# Patient Record
Sex: Female | Born: 1955
Health system: Southern US, Community
[De-identification: ages and names within clinical notes are randomized; demographics above are authoritative.]

## PROBLEM LIST (undated history)

## (undated) DIAGNOSIS — J189 Pneumonia, unspecified organism: Secondary | ICD-10-CM

## (undated) DIAGNOSIS — K219 Gastro-esophageal reflux disease without esophagitis: Secondary | ICD-10-CM

## (undated) DIAGNOSIS — J8410 Pulmonary fibrosis, unspecified: Secondary | ICD-10-CM

## (undated) HISTORY — PX: ANKLE SURGERY: SHX546

## (undated) HISTORY — PX: COLONOSCOPY: SHX174

## (undated) HISTORY — PX: LUNG BIOPSY: SHX232

## (undated) HISTORY — DX: Gastro-esophageal reflux disease without esophagitis: K21.9

## (undated) HISTORY — PX: HEMORRHOID SURGERY: SHX153

## (undated) HISTORY — DX: Pneumonia, unspecified organism: J18.9

## (undated) HISTORY — DX: Pulmonary fibrosis, unspecified: J84.10

## (undated) HISTORY — PX: TUBAL LIGATION: SHX77

## (undated) HISTORY — PX: ESOPHAGOGASTRODUODENOSCOPY: SHX1529

---

## 2008-06-27 ENCOUNTER — Encounter: Payer: Self-pay | Admitting: Family Medicine

## 2008-06-27 LAB — HM COLONOSCOPY

## 2016-07-29 DIAGNOSIS — K219 Gastro-esophageal reflux disease without esophagitis: Secondary | ICD-10-CM | POA: Diagnosis not present

## 2016-07-29 DIAGNOSIS — E042 Nontoxic multinodular goiter: Secondary | ICD-10-CM | POA: Diagnosis not present

## 2016-07-29 DIAGNOSIS — F458 Other somatoform disorders: Secondary | ICD-10-CM | POA: Diagnosis not present

## 2016-09-25 DIAGNOSIS — Z7952 Long term (current) use of systemic steroids: Secondary | ICD-10-CM | POA: Diagnosis not present

## 2016-09-25 DIAGNOSIS — J189 Pneumonia, unspecified organism: Secondary | ICD-10-CM | POA: Diagnosis not present

## 2016-09-25 DIAGNOSIS — I517 Cardiomegaly: Secondary | ICD-10-CM | POA: Diagnosis not present

## 2016-09-25 DIAGNOSIS — J181 Lobar pneumonia, unspecified organism: Secondary | ICD-10-CM | POA: Diagnosis not present

## 2016-09-25 DIAGNOSIS — J84112 Idiopathic pulmonary fibrosis: Secondary | ICD-10-CM | POA: Diagnosis not present

## 2016-09-25 DIAGNOSIS — R0602 Shortness of breath: Secondary | ICD-10-CM | POA: Diagnosis not present

## 2016-09-25 DIAGNOSIS — R0600 Dyspnea, unspecified: Secondary | ICD-10-CM | POA: Diagnosis not present

## 2016-09-25 DIAGNOSIS — Z87891 Personal history of nicotine dependence: Secondary | ICD-10-CM | POA: Diagnosis not present

## 2016-09-25 DIAGNOSIS — E7800 Pure hypercholesterolemia, unspecified: Secondary | ICD-10-CM | POA: Diagnosis not present

## 2016-09-25 DIAGNOSIS — J8410 Pulmonary fibrosis, unspecified: Secondary | ICD-10-CM | POA: Diagnosis not present

## 2016-09-25 DIAGNOSIS — J168 Pneumonia due to other specified infectious organisms: Secondary | ICD-10-CM | POA: Diagnosis not present

## 2016-09-25 DIAGNOSIS — R509 Fever, unspecified: Secondary | ICD-10-CM | POA: Diagnosis not present

## 2016-09-25 DIAGNOSIS — J9611 Chronic respiratory failure with hypoxia: Secondary | ICD-10-CM | POA: Diagnosis not present

## 2016-10-03 DIAGNOSIS — R05 Cough: Secondary | ICD-10-CM | POA: Diagnosis not present

## 2016-10-03 DIAGNOSIS — K219 Gastro-esophageal reflux disease without esophagitis: Secondary | ICD-10-CM | POA: Diagnosis not present

## 2016-10-21 DIAGNOSIS — J84112 Idiopathic pulmonary fibrosis: Secondary | ICD-10-CM | POA: Diagnosis not present

## 2016-10-21 DIAGNOSIS — J984 Other disorders of lung: Secondary | ICD-10-CM | POA: Diagnosis not present

## 2016-10-21 DIAGNOSIS — R0902 Hypoxemia: Secondary | ICD-10-CM | POA: Diagnosis not present

## 2016-10-21 DIAGNOSIS — J849 Interstitial pulmonary disease, unspecified: Secondary | ICD-10-CM | POA: Diagnosis not present

## 2016-10-21 DIAGNOSIS — Z9981 Dependence on supplemental oxygen: Secondary | ICD-10-CM | POA: Diagnosis not present

## 2017-01-07 DIAGNOSIS — H2513 Age-related nuclear cataract, bilateral: Secondary | ICD-10-CM | POA: Diagnosis not present

## 2017-01-07 DIAGNOSIS — H04123 Dry eye syndrome of bilateral lacrimal glands: Secondary | ICD-10-CM | POA: Diagnosis not present

## 2017-02-21 DIAGNOSIS — J9611 Chronic respiratory failure with hypoxia: Secondary | ICD-10-CM | POA: Diagnosis not present

## 2017-02-21 DIAGNOSIS — J84112 Idiopathic pulmonary fibrosis: Secondary | ICD-10-CM | POA: Diagnosis not present

## 2017-02-21 DIAGNOSIS — N281 Cyst of kidney, acquired: Secondary | ICD-10-CM | POA: Diagnosis not present

## 2017-02-21 DIAGNOSIS — J45909 Unspecified asthma, uncomplicated: Secondary | ICD-10-CM | POA: Diagnosis not present

## 2017-02-21 DIAGNOSIS — I517 Cardiomegaly: Secondary | ICD-10-CM | POA: Diagnosis not present

## 2017-02-21 DIAGNOSIS — J849 Interstitial pulmonary disease, unspecified: Secondary | ICD-10-CM | POA: Diagnosis not present

## 2017-04-09 DIAGNOSIS — H2513 Age-related nuclear cataract, bilateral: Secondary | ICD-10-CM | POA: Diagnosis not present

## 2017-04-09 DIAGNOSIS — H04123 Dry eye syndrome of bilateral lacrimal glands: Secondary | ICD-10-CM | POA: Diagnosis not present

## 2017-04-11 DIAGNOSIS — Z23 Encounter for immunization: Secondary | ICD-10-CM | POA: Diagnosis not present

## 2017-04-30 DIAGNOSIS — H25813 Combined forms of age-related cataract, bilateral: Secondary | ICD-10-CM | POA: Diagnosis not present

## 2017-05-05 DIAGNOSIS — J439 Emphysema, unspecified: Secondary | ICD-10-CM | POA: Diagnosis not present

## 2017-05-05 DIAGNOSIS — Z9981 Dependence on supplemental oxygen: Secondary | ICD-10-CM | POA: Diagnosis not present

## 2017-05-05 DIAGNOSIS — G4733 Obstructive sleep apnea (adult) (pediatric): Secondary | ICD-10-CM | POA: Diagnosis not present

## 2017-05-05 DIAGNOSIS — I10 Essential (primary) hypertension: Secondary | ICD-10-CM | POA: Diagnosis not present

## 2017-05-05 DIAGNOSIS — R0602 Shortness of breath: Secondary | ICD-10-CM | POA: Diagnosis not present

## 2017-05-05 DIAGNOSIS — J849 Interstitial pulmonary disease, unspecified: Secondary | ICD-10-CM | POA: Diagnosis not present

## 2017-05-05 DIAGNOSIS — Z9989 Dependence on other enabling machines and devices: Secondary | ICD-10-CM | POA: Diagnosis not present

## 2017-05-05 DIAGNOSIS — Z87891 Personal history of nicotine dependence: Secondary | ICD-10-CM | POA: Diagnosis not present

## 2017-05-05 DIAGNOSIS — Z79899 Other long term (current) drug therapy: Secondary | ICD-10-CM | POA: Diagnosis not present

## 2017-05-13 DIAGNOSIS — Z79899 Other long term (current) drug therapy: Secondary | ICD-10-CM | POA: Diagnosis not present

## 2017-05-13 DIAGNOSIS — Z7952 Long term (current) use of systemic steroids: Secondary | ICD-10-CM | POA: Diagnosis not present

## 2017-05-13 DIAGNOSIS — H2512 Age-related nuclear cataract, left eye: Secondary | ICD-10-CM | POA: Diagnosis not present

## 2017-05-13 DIAGNOSIS — H25812 Combined forms of age-related cataract, left eye: Secondary | ICD-10-CM | POA: Diagnosis not present

## 2017-06-05 DIAGNOSIS — H2511 Age-related nuclear cataract, right eye: Secondary | ICD-10-CM | POA: Diagnosis not present

## 2017-06-05 DIAGNOSIS — H25811 Combined forms of age-related cataract, right eye: Secondary | ICD-10-CM | POA: Diagnosis not present

## 2017-06-24 DIAGNOSIS — J9611 Chronic respiratory failure with hypoxia: Secondary | ICD-10-CM | POA: Diagnosis not present

## 2017-07-11 DIAGNOSIS — Z124 Encounter for screening for malignant neoplasm of cervix: Secondary | ICD-10-CM | POA: Diagnosis not present

## 2017-07-11 DIAGNOSIS — R0602 Shortness of breath: Secondary | ICD-10-CM | POA: Diagnosis not present

## 2017-07-11 DIAGNOSIS — Z1231 Encounter for screening mammogram for malignant neoplasm of breast: Secondary | ICD-10-CM | POA: Diagnosis not present

## 2017-07-11 DIAGNOSIS — M8580 Other specified disorders of bone density and structure, unspecified site: Secondary | ICD-10-CM | POA: Diagnosis not present

## 2017-08-06 DIAGNOSIS — M810 Age-related osteoporosis without current pathological fracture: Secondary | ICD-10-CM | POA: Diagnosis not present

## 2017-08-06 DIAGNOSIS — K219 Gastro-esophageal reflux disease without esophagitis: Secondary | ICD-10-CM | POA: Diagnosis not present

## 2017-08-06 DIAGNOSIS — R252 Cramp and spasm: Secondary | ICD-10-CM | POA: Diagnosis not present

## 2017-08-06 DIAGNOSIS — M159 Polyosteoarthritis, unspecified: Secondary | ICD-10-CM | POA: Diagnosis not present

## 2017-08-06 DIAGNOSIS — E782 Mixed hyperlipidemia: Secondary | ICD-10-CM | POA: Diagnosis not present

## 2017-08-11 DIAGNOSIS — E782 Mixed hyperlipidemia: Secondary | ICD-10-CM | POA: Diagnosis not present

## 2017-08-11 DIAGNOSIS — M159 Polyosteoarthritis, unspecified: Secondary | ICD-10-CM | POA: Diagnosis not present

## 2017-08-11 DIAGNOSIS — M810 Age-related osteoporosis without current pathological fracture: Secondary | ICD-10-CM | POA: Diagnosis not present

## 2017-08-11 DIAGNOSIS — R252 Cramp and spasm: Secondary | ICD-10-CM | POA: Diagnosis not present

## 2017-08-11 DIAGNOSIS — K219 Gastro-esophageal reflux disease without esophagitis: Secondary | ICD-10-CM | POA: Diagnosis not present

## 2017-08-25 DIAGNOSIS — Z9981 Dependence on supplemental oxygen: Secondary | ICD-10-CM | POA: Diagnosis not present

## 2017-08-25 DIAGNOSIS — G4733 Obstructive sleep apnea (adult) (pediatric): Secondary | ICD-10-CM | POA: Diagnosis not present

## 2017-08-25 DIAGNOSIS — Z87891 Personal history of nicotine dependence: Secondary | ICD-10-CM | POA: Diagnosis not present

## 2017-08-25 DIAGNOSIS — I10 Essential (primary) hypertension: Secondary | ICD-10-CM | POA: Diagnosis not present

## 2017-08-25 DIAGNOSIS — Z79899 Other long term (current) drug therapy: Secondary | ICD-10-CM | POA: Diagnosis not present

## 2017-08-25 DIAGNOSIS — R0602 Shortness of breath: Secondary | ICD-10-CM | POA: Diagnosis not present

## 2017-08-25 DIAGNOSIS — J849 Interstitial pulmonary disease, unspecified: Secondary | ICD-10-CM | POA: Diagnosis not present

## 2017-08-25 DIAGNOSIS — Z9989 Dependence on other enabling machines and devices: Secondary | ICD-10-CM | POA: Diagnosis not present

## 2017-08-25 DIAGNOSIS — J8410 Pulmonary fibrosis, unspecified: Secondary | ICD-10-CM | POA: Diagnosis not present

## 2017-09-01 DIAGNOSIS — Z780 Asymptomatic menopausal state: Secondary | ICD-10-CM | POA: Diagnosis not present

## 2017-09-01 DIAGNOSIS — M8588 Other specified disorders of bone density and structure, other site: Secondary | ICD-10-CM | POA: Diagnosis not present

## 2017-09-01 DIAGNOSIS — M810 Age-related osteoporosis without current pathological fracture: Secondary | ICD-10-CM | POA: Diagnosis not present

## 2017-10-23 DIAGNOSIS — J9611 Chronic respiratory failure with hypoxia: Secondary | ICD-10-CM | POA: Diagnosis not present

## 2017-11-10 DIAGNOSIS — R0602 Shortness of breath: Secondary | ICD-10-CM | POA: Diagnosis not present

## 2017-11-19 DIAGNOSIS — H26491 Other secondary cataract, right eye: Secondary | ICD-10-CM | POA: Diagnosis not present

## 2017-11-19 DIAGNOSIS — H26493 Other secondary cataract, bilateral: Secondary | ICD-10-CM | POA: Diagnosis not present

## 2017-11-19 DIAGNOSIS — H04123 Dry eye syndrome of bilateral lacrimal glands: Secondary | ICD-10-CM | POA: Diagnosis not present

## 2017-11-26 DIAGNOSIS — H26492 Other secondary cataract, left eye: Secondary | ICD-10-CM | POA: Diagnosis not present

## 2018-03-24 DIAGNOSIS — Z23 Encounter for immunization: Secondary | ICD-10-CM | POA: Diagnosis not present

## 2018-04-06 ENCOUNTER — Ambulatory Visit (INDEPENDENT_AMBULATORY_CARE_PROVIDER_SITE_OTHER): Payer: Medicare Other | Admitting: Family

## 2018-04-06 ENCOUNTER — Encounter: Payer: Self-pay | Admitting: Family

## 2018-04-06 VITALS — BP 126/78 | HR 77 | Temp 98.1°F | Ht 66.0 in | Wt 157.1 lb

## 2018-04-06 DIAGNOSIS — M810 Age-related osteoporosis without current pathological fracture: Secondary | ICD-10-CM

## 2018-04-06 DIAGNOSIS — J8410 Pulmonary fibrosis, unspecified: Secondary | ICD-10-CM | POA: Diagnosis not present

## 2018-04-06 DIAGNOSIS — E785 Hyperlipidemia, unspecified: Secondary | ICD-10-CM

## 2018-04-06 DIAGNOSIS — K219 Gastro-esophageal reflux disease without esophagitis: Secondary | ICD-10-CM

## 2018-04-06 DIAGNOSIS — J849 Interstitial pulmonary disease, unspecified: Secondary | ICD-10-CM | POA: Diagnosis not present

## 2018-04-06 DIAGNOSIS — F4321 Adjustment disorder with depressed mood: Secondary | ICD-10-CM

## 2018-04-06 MED ORDER — ESCITALOPRAM OXALATE 10 MG PO TABS: 10.0000 mg | ORAL_TABLET | Freq: Every day | ORAL | 1 refills | Status: DC

## 2018-04-06 NOTE — Patient Instructions (Signed)
Please check with your previous PCP about date/ location of colonoscopy; bone density;

## 2018-04-06 NOTE — Progress Notes (Signed)
Stacy [name] is a 62 y.o. female with the following history as recorded in EpicCare:  Patient Active Problem List   Diagnosis Date Noted  . Pulmonary fibrosis ([place]) 04/06/2018  . ILD (interstitial lung disease) ([place]) 04/06/2018  . Gastroesophageal reflux disease 04/06/2018  . Osteoporosis 04/06/2018    Current Outpatient Medications  Medication Sig Dispense Refill  . azaTHIOprine (IMURAN) 50 MG tablet     . ibandronate (BONIVA) 150 MG tablet Take by mouth.    Marland Kitchen omeprazole (PRILOSEC) 40 MG capsule Take by mouth.    . Pirfenidone (ESBRIET) 267 MG TABS     . predniSONE (DELTASONE) 10 MG tablet Take by mouth.    . simvastatin (ZOCOR) 20 MG tablet Take by mouth.    . sulfamethoxazole-trimethoprim (BACTRIM DS,SEPTRA DS) 800-160 MG tablet TAKE 1 TABLET DAILY ON MONDAY, WEDNESDAY, AND FRIDAY    . escitalopram (LEXAPRO) 10 MG tablet Take 1 tablet (10 mg total) by mouth daily. 30 tablet 1  . formoterol (PERFOROMIST) 20 MCG/2ML nebulizer solution Inhale into the lungs.     No current facility-administered medications for this visit.     Allergies: Patient has no allergy information on record.  No past medical history on file.    No family history on file.  Social History   Tobacco Use  . Smoking status: Unknown If Ever Smoked  . Smokeless tobacco: Never Used  Substance Use Topics  . Alcohol use: Not on file    Subjective:  Presents as a new patient; has recently moved back to [place] to be closer to family; history of pulmonary fibrosis/ ILD- works with pulmonologist in [place]; sees them every 6 months- 1 x per year;   Mammogram is scheduled in October 2019 with GYN; pap smear scheduled with GYN; unsure of date of last date of DEXA- unsure how long she has been on medication;   Per patient, she is "up to date" on all of her vaccines- she has had Shingrix; thinks Pneumonia is up to date as well; will contact her pulmonologist to verify;   Very limited historian- does not have  any records with her today/ does not have list of current medications;  Does mentions problems with stress/ anxiety/ poor sleeping due to her son's drug habits; even though PHQ 2 is normal, patient admits to feeling stressed;    Objective:  Vitals:   04/06/18 1118 04/06/18 1119  BP: 126/78   Pulse: 77   Temp: 98.1 F (36.7 C)   TempSrc: Oral   SpO2: 96%   Weight: 157 lb 1.3 oz (71.3 kg)   Height:  _0  (1.676 m)    General: Well developed, well nourished, in no acute distress  Skin : Warm and dry.  Head: Normocephalic and atraumatic  Lungs: Respirations unlabored; wears oxygen CVS exam: normal rate and regular rhythm.  Neurologic: Alert and oriented; speech intact; face symmetrical; moves all extremities well; CNII-XII intact without focal deficit   Assessment:  1. Pulmonary fibrosis ([place])   2. ILD (interstitial lung disease) ([place])   3. Gastroesophageal reflux disease, esophagitis presence not specified   4. Osteoporosis, unspecified osteoporosis type, unspecified pathological fracture presence   5. Hyperlipidemia, unspecified hyperlipidemia type   6. Situational depression     Plan:  1. & 2. Under care of pulmonology; planning to continue with specialist in [place];  3. Continue Omeprazole; 4. Patient to contact previous PCP about date of last DEXA/ length of time she has been on Boniva; if more than 5  years, need to stop this medication; 5. Continue Zocor; had CPE with her previous PCP in August 2019; unclear if labs were done- not seen in Epic; she is to contact her PCP; 6. Trial of Lexapro 10 mg daily; follow-up in 1 month;  Patient also needs to get information on last colonoscopy;     No follow-ups on file.  No orders of the defined types were placed in this encounter.   Requested Prescriptions   Signed Prescriptions Disp Refills  . escitalopram (LEXAPRO) 10 MG tablet 30 tablet 1    Sig: Take 1 tablet (10 mg total) by mouth daily.

## 2018-04-07 DIAGNOSIS — H04123 Dry eye syndrome of bilateral lacrimal glands: Secondary | ICD-10-CM | POA: Diagnosis not present

## 2018-05-06 ENCOUNTER — Encounter: Payer: Self-pay | Admitting: Family

## 2018-05-06 ENCOUNTER — Ambulatory Visit (INDEPENDENT_AMBULATORY_CARE_PROVIDER_SITE_OTHER): Payer: Medicare Other | Admitting: Family

## 2018-05-06 VITALS — BP 126/78 | HR 82 | Temp 98.2°F | Ht 66.0 in | Wt 156.8 lb

## 2018-05-06 DIAGNOSIS — F418 Other specified anxiety disorders: Secondary | ICD-10-CM | POA: Diagnosis not present

## 2018-05-06 DIAGNOSIS — M810 Age-related osteoporosis without current pathological fracture: Secondary | ICD-10-CM | POA: Diagnosis not present

## 2018-05-06 DIAGNOSIS — F5104 Psychophysiologic insomnia: Secondary | ICD-10-CM

## 2018-05-06 MED ORDER — TRAZODONE HCL 50 MG PO TABS: 50.0000 mg | ORAL_TABLET | Freq: Every evening | ORAL | 1 refills | Status: DC | PRN

## 2018-05-06 NOTE — Patient Instructions (Addendum)
Please cut the Lexapro in 1/2- Take 1/2 tablet ( 5 mg ) x 3 days; then 1/2 tablet every other day x 3 days;   Check with GYN about mammogram, bone density and pap smear; I will pick up as needed;

## 2018-05-06 NOTE — Progress Notes (Signed)
Stacy [name] is a 62 y.o. female with the following history as recorded in EpicCare:  Patient Active Problem List   Diagnosis Date Noted  . Pulmonary fibrosis ([place]) 04/06/2018  . ILD (interstitial lung disease) ([place]) 04/06/2018  . Gastroesophageal reflux disease 04/06/2018  . Osteoporosis 04/06/2018    Current Outpatient Medications  Medication Sig Dispense Refill  . azaTHIOprine (IMURAN) 50 MG tablet     . escitalopram (LEXAPRO) 10 MG tablet Take 1 tablet (10 mg total) by mouth daily. 30 tablet 1  . formoterol (PERFOROMIST) 20 MCG/2ML nebulizer solution Inhale into the lungs.    Marland Kitchen ibuprofen (ADVIL,MOTRIN) 800 MG tablet Take by mouth.    . Multiple Vitamins-Minerals (CENTRUM SILVER) tablet Take by mouth.    Marland Kitchen omeprazole (PRILOSEC) 40 MG capsule Take by mouth.    . Pirfenidone (ESBRIET) 267 MG TABS     . predniSONE (DELTASONE) 10 MG tablet Take by mouth.    . simvastatin (ZOCOR) 20 MG tablet Take by mouth.    . sulfamethoxazole-trimethoprim (BACTRIM DS,SEPTRA DS) 800-160 MG tablet TAKE 1 TABLET DAILY ON MONDAY, WEDNESDAY, AND FRIDAY    . traZODone (DESYREL) 50 MG tablet Take 1 tablet (50 mg total) by mouth at bedtime as needed for sleep. 30 tablet 1   No current facility-administered medications for this visit.     Allergies: Patient has no allergy information on record.  History reviewed. No pertinent past medical history.  History reviewed. No pertinent surgical history.  History reviewed. No pertinent family history.  Social History   Tobacco Use  . Smoking status: Unknown If Ever Smoked  . Smokeless tobacco: Never Used  Substance Use Topics  . Alcohol use: Not on file    Subjective:  Patient presents for a one month follow-up on recent start of Lexapro; does not feel medication is not as beneficial as she had hoped; still does not feel like she is sleeping well; would prefer to stop the Lexapro and try an alternative to help her sleep; admits this is not a new problem-  chronic insomnia for years;    Has been on Boniva greater than 5 years- unsure of date of last DEXA; will check with GYN at next appointment as well.   Objective:  Vitals:   05/06/18 1326  BP: 126/78  Pulse: 82  Temp: 98.2 F (36.8 C)  TempSrc: Oral  SpO2: 95%  Weight: 156 lb 12.8 oz (71.1 kg)  Height: _0  (1.676 m)    General: Well developed, well nourished, in no acute distress  Skin : Warm and dry.  Head: Normocephalic and atraumatic  Eyes: Sclera and conjunctiva clear; pupils round and reactive to light; extraocular movements intact  Ears: External normal; canals clear; tympanic membranes normal  Oropharynx: Pink, supple. No suspicious lesions  Neck: Supple without thyromegaly, adenopathy  Lungs: Respirations unlabored; wearing oxygen today; Neurologic: Alert and oriented; speech intact; face symmetrical; moves all extremities well; CNII-XII intact without focal deficit   Assessment:  1. Chronic insomnia   2. Situational anxiety   3. Osteoporosis, unspecified osteoporosis type, unspecified pathological fracture presence     Plan:   1. Taper off Lexapro as discussed; trial of Trazodone 100 mg daily; call back with response to medication early next week; 2. Patient defers medication; 3. Stop Boniva- follow-up to be determined based on DEXA results.   Will be going back to see her GYN for updated mammogram, pap smear; may plan to transfer care to [facility] next year;  Has been  on Boniva greater than 5 years- unsure of date of last DEXA; she will check with GYN at next appointment as well.    No follow-ups on file.  No orders of the defined types were placed in this encounter.   Requested Prescriptions   Signed Prescriptions Disp Refills  . traZODone (DESYREL) 50 MG tablet 30 tablet 1    Sig: Take 1 tablet (50 mg total) by mouth at bedtime as needed for sleep.

## 2018-05-18 ENCOUNTER — Telehealth: Payer: Self-pay | Admitting: Family

## 2018-05-18 ENCOUNTER — Encounter: Payer: Self-pay | Admitting: Family

## 2018-05-18 DIAGNOSIS — Z86010 Personal history of colonic polyps: Principal | ICD-10-CM

## 2018-05-18 DIAGNOSIS — Z1211 Encounter for screening for malignant neoplasm of colon: Secondary | ICD-10-CM

## 2018-05-18 NOTE — Progress Notes (Signed)
Outside notes received. Information abstracted. Notes sent to scan.

## 2018-05-18 NOTE — Telephone Encounter (Signed)
Please let her know that we were able to get copy of her colonoscopy; due for repeat in December 2019; referral has been updated.

## 2018-05-18 NOTE — Telephone Encounter (Signed)
Voicemail was full and unable to leave message. Called her daughter and gave her info.

## 2018-05-19 ENCOUNTER — Telehealth: Payer: Self-pay | Admitting: Family

## 2018-05-19 ENCOUNTER — Other Ambulatory Visit: Payer: Self-pay | Admitting: Family

## 2018-05-19 MED ORDER — TRAZODONE HCL 100 MG PO TABS: 100.0000 mg | ORAL_TABLET | Freq: Every evening | ORAL | 1 refills | Status: DC | PRN

## 2018-05-19 NOTE — Telephone Encounter (Signed)
Copied from [place] [id number]. Topic: Quick Communication - Rx Refill/Question >> May 19, 2018  1:35 PM Wynetta Emery, [place] C wrote: Medication: traZODone (DESYREL) 50 MG tablet- pt says that she was advised by provider to let her know if 1 or 2 works pill works best. Pt says that 2 pills helps her best. She would like a refill on this medication.   Has the patient contacted their pharmacy? No  (Agent: If no, request that the patient contact the pharmacy for the refill.) (Agent: If yes, when and what did the pharmacy advise?)  Preferred Pharmacy (with phone number or street name): [id number] [phone](Phone) [phone](Fax)    Agent: Please be advised that RX refills may take up to 3 business days. We ask that you follow-up with your pharmacy.

## 2018-06-11 ENCOUNTER — Encounter: Payer: Self-pay | Admitting: Gastroenterology

## 2018-06-26 ENCOUNTER — Other Ambulatory Visit: Payer: Self-pay | Admitting: Family

## 2018-06-26 NOTE — Telephone Encounter (Signed)
Copied from [place] [id number]. Topic: Quick Communication - Rx Refill/Question >> Jun 26, 2018  4:35 PM Mcneil, Ja-Kwan wrote: Medication: simvastatin (ZOCOR) 20 MG tablet and omeprazole (PRILOSEC) 40 MG capsule   Has the patient contacted their pharmacy? no  Preferred Pharmacy (with phone number or street name): [id number] [phone](Phone)  [phone](Fax)  Agent: Please be advised that RX refills may take up to 3 business days. We ask that you follow-up with your pharmacy.

## 2018-06-26 NOTE — Telephone Encounter (Signed)
Requested medication (s) are due for refill today: yes  Requested medication (s) are on the active medication list: yes  Last refill: 02/09/18 historical provider  Future visit scheduled: no  Notes to clinic:  Historical provider    Requested Prescriptions  Pending Prescriptions Disp Refills   simvastatin (ZOCOR) 20 MG tablet 30 tablet     Sig: Take by mouth.     Cardiovascular:  Antilipid - Statins Failed - 06/26/2018  4:47 PM      Failed - Total Cholesterol in normal range and within 360 days    No results found for: CHOL, POCCHOL       Failed - LDL in normal range and within 360 days    No results found for: LDLCALC, LDLC, HIRISKLDL       Failed - HDL in normal range and within 360 days    No results found for: HDL       Failed - Triglycerides in normal range and within 360 days    No results found for: TRIG       Passed - Patient is not pregnant      Passed - Valid encounter within last 12 months    Recent Outpatient Visits          1 month ago Chronic insomnia   [place], Stacy [name], [place]   2 months ago Pulmonary fibrosis [facility])   [place], Stacy [name], [place]

## 2018-06-29 MED ORDER — SIMVASTATIN 20 MG PO TABS: 20.0000 mg | ORAL_TABLET | Freq: Every day | ORAL | 3 refills | Status: DC

## 2018-07-03 ENCOUNTER — Other Ambulatory Visit: Payer: Self-pay | Admitting: Family

## 2018-07-03 MED ORDER — OMEPRAZOLE 40 MG PO CPDR: 40.0000 mg | DELAYED_RELEASE_CAPSULE | Freq: Every day | ORAL | 2 refills | Status: DC

## 2018-07-03 NOTE — Telephone Encounter (Signed)
Requested medication (s) are due for refill today: yes  Requested medication (s) are on the active medication list: yes to both meds  Last refill:  Omeprazole: 04/06/18                   Bactrim DS: 04/06/18  Future visit scheduled: No  Notes to clinic:  Both medications are from historical providers   Requested Prescriptions  Pending Prescriptions Disp Refills   omeprazole (PRILOSEC) 40 MG capsule      Sig: Take by mouth.     Gastroenterology: Proton Pump Inhibitors Passed - 07/03/2018 11:07 AM      Passed - Valid encounter within last 12 months    Recent Outpatient Visits          1 month ago Chronic insomnia   [name], Stacy Repress, [place]   2 months ago Pulmonary fibrosis [facility])   [place], [name]            sulfamethoxazole-trimethoprim (BACTRIM DS,SEPTRA DS) 800-160 MG tablet       Off-Protocol Failed - 07/03/2018 11:07 AM      Failed - Medication not assigned to a protocol, review manually.      Passed - Valid encounter within last 12 months    Recent Outpatient Visits          1 month ago Chronic insomnia   [name], Stacy Repress, [name]   2 months ago Pulmonary fibrosis [facility])   [name], Stacy Repress, [name]

## 2018-07-03 NOTE — Telephone Encounter (Signed)
Copied from [place] [phone]. Topic: Quick Communication - Rx Refill/Question >> Jul 03, 2018 11:01 AM Leward Quan A wrote: Medication: omeprazole (PRILOSEC) 40 MG capsule, sulfamethoxazole-trimethoprim (BACTRIM DS,SEPTRA DS) 800-160 MG tablet  Patient is need of the Omeprazole. Pharmacy need new Rx please   Has the patient contacted their pharmacy? Yes.   (Agent: If no, request that the patient contact the pharmacy for the refill.) (Agent: If yes, when and what did the pharmacy advise?)  Preferred Pharmacy (with phone number or street name): [id number] [name](Phone) [phone](Fax)    Agent: Please be advised that RX refills may take up to 3 business days. We ask that you follow-up with your pharmacy.

## 2018-07-03 NOTE — Telephone Encounter (Signed)
Please let her know that her pulmonologist will have to prescribe the Bactrim for her; we have never prescribed this and I do not feel comfortable managing for her.

## 2018-07-14 ENCOUNTER — Encounter: Payer: Self-pay | Admitting: Gastroenterology

## 2018-07-14 ENCOUNTER — Ambulatory Visit (INDEPENDENT_AMBULATORY_CARE_PROVIDER_SITE_OTHER): Payer: Medicare Other | Admitting: Gastroenterology

## 2018-07-14 VITALS — BP 138/70 | HR 72 | Ht 66.0 in | Wt 154.0 lb

## 2018-07-14 DIAGNOSIS — Z86010 Personal history of colonic polyps: Secondary | ICD-10-CM | POA: Diagnosis not present

## 2018-07-14 DIAGNOSIS — J849 Interstitial pulmonary disease, unspecified: Secondary | ICD-10-CM

## 2018-07-14 DIAGNOSIS — K219 Gastro-esophageal reflux disease without esophagitis: Secondary | ICD-10-CM

## 2018-07-14 MED ORDER — OMEPRAZOLE 20 MG PO CPDR: 20.0000 mg | DELAYED_RELEASE_CAPSULE | Freq: Every day | ORAL | 3 refills | Status: DC

## 2018-07-14 NOTE — Patient Instructions (Addendum)
If you are age 66 or older, your body mass index should be between 23-30. Your Body mass index is 24.86 kg/m. If this is out of the aforementioned range listed, please consider follow up with your Primary Care Provider.  If you are age 75 or younger, your body mass index should be between 19-25. Your Body mass index is 24.86 kg/m. If this is out of the aformentioned range listed, please consider follow up with your Primary Care Provider.   We have sent the following medications to your pharmacy for you to pick up at your convenience: Omeprazole 53m once daily  We will refer you to Pulmonology for Interstitial Lung disease. They will call you to schedule an appointment.  We [place]  Thank you for entrusting me with your care and [facility]

## 2018-07-14 NOTE — Progress Notes (Signed)
HPI :  63 y/o female with history of ILD and pulmonary fibrosis, GERD, referred here for a new patient visit by Marrian Salvage given her history of colon polyps and GERD.  The patient recently moved here from [place]. She is here to establish her GI care. She endorses a colonoscopy in 2009 at which time she had a sessile serrated polyp removed. She states she had a follow-up colonoscopy in March 2015 done [facility], was told this was normal although we don't have a report available. She denies any problems with her bowels at this time. Denies any constipation or diarrhea that is persistent. She has a history of hemorrhoids for which she had a surgery 2-3 years ago, this has resolved the symptoms. She denies any blood in her stools. She is no family history of colon cancer. She has a history of interstitial lung disease with pulmonary fibrosis. She is on home oxygen roughly 3 L. She has not yet been set up with a pulmonologist in [place] and is asking for referral today.  Otherwise she's been on omeprazole 40 mg once a day for long-standing reflux. She reports this controls her symptoms pretty well when she denies any breakthrough. She has only rare breakthrough at night if she eats something light before bed. She previously smoked tobacco but quit. She denies any dysphagia or odynophagia. She does think she had an EGD in [place] about 2-3 years ago and was reportedly normal, records of that are not available today.    Past Medical History:  Diagnosis Date  . GERD (gastroesophageal reflux disease)   . Pneumonia   . Pulmonary fibrosis ([place])      Past Surgical History:  Procedure Laterality Date  . ANKLE SURGERY Left    fx, has plates  . COLONOSCOPY    . ESOPHAGOGASTRODUODENOSCOPY    . HEMORRHOID SURGERY    . LUNG BIOPSY    . TUBAL LIGATION     Family History  Problem Relation Age of Onset  . Diabetes Mother   . Cancer Father        unsure of primary source, farmer    . Asthma Brother    Social History   Tobacco Use  . Smoking status: Never Smoker  . Smokeless tobacco: Never Used  Substance Use Topics  . Alcohol use: Yes    Comment: rarely  . Drug use: Never   Current Outpatient Medications  Medication Sig Dispense Refill  . azaTHIOprine (IMURAN) 50 MG tablet Take 150 mg by mouth daily.     . formoterol (PERFOROMIST) 20 MCG/2ML nebulizer solution Inhale into the lungs.    Marland Kitchen ibuprofen (ADVIL,MOTRIN) 800 MG tablet Take by mouth.    . Multiple Vitamins-Minerals (CENTRUM SILVER) tablet Take by mouth.    Marland Kitchen omeprazole (PRILOSEC) 40 MG capsule Take 1 capsule (40 mg total) by mouth daily. 30 capsule 2  . OXYGEN Inhale into the lungs. 3 liters per minute, uses when up and moving around    . Pirfenidone (ESBRIET) 267 MG TABS     . predniSONE (DELTASONE) 10 MG tablet Take by mouth.    . simvastatin (ZOCOR) 20 MG tablet Take 1 tablet (20 mg total) by mouth daily at 6 PM. 30 tablet 3  . sulfamethoxazole-trimethoprim (BACTRIM DS,SEPTRA DS) 800-160 MG tablet TAKE 1 TABLET DAILY ON MONDAY, WEDNESDAY, AND FRIDAY    . traZODone (DESYREL) 100 MG tablet Take 1 tablet (100 mg total) by mouth at bedtime as needed for sleep. 90 tablet 1  No current facility-administered medications for this visit.    No Known Allergies   Review of Systems: All systems reviewed and negative except where noted in HPI.     Physical Exam: BP 138/70   Pulse 72   Ht 5' 6" (1.676 m)   Wt 154 lb (69.9 kg)   BMI 24.86 kg/m  Constitutional: Pleasant, female in no acute distress, wearing oxygen HEENT: Normocephalic and atraumatic. Conjunctivae are normal. No scleral icterus. Neck supple.  Cardiovascular: Normal rate, regular rhythm.  Pulmonary/chest: Effort normal and breath sounds normal. No wheezing, rales or rhonchi. Abdominal: Soft, nondistended, nontender.There are no masses palpable. No hepatomegaly. Extremities: no edema Lymphadenopathy: No cervical adenopathy  noted. Neurological: Alert and oriented to person place and time. Skin: Skin is warm and dry. No rashes noted. Psychiatric: Normal mood and affect. Behavior is normal.   ASSESSMENT AND PLAN: 63 year old female here for new patient evaluation regarding the following:  History of colon polyps - as outlined above, last colonoscopy in March 2015. If this was normal I'm not sure she warrants a surveillance colonoscopy at this time. Given her oxygen use she is higher than average risk for anesthesia. If she warrants a colonoscopy this will need to be done at the hospital. I requested to obtain her colonoscopy report from [facility] to clarify when she is next due for colonoscopy. She'll be contacted once we have this information with further recommendations. She agreed.  GERD - on chronic PPI for reflux. Reportedly had a recent EGD with no evidence of Barrett. I discussed long-term risks and benefits of PPI use with her. Recommend the lowest dose needed to control her symptoms. She has not tried reducing dose to 20 mg in a while. She will try this for a few weeks and see how she does. If she tolerates and we'll continue that dose, if she has rebound or frequent breakthrough will go back to 40 mg daily.  Interstitial lung disease / oxygen dependant - needs to establish with pulmonary, will help and refer her to them  [place] Cellar, MD [facility]  CC: Marrian Salvage,*

## 2018-07-16 ENCOUNTER — Telehealth: Payer: Self-pay | Admitting: Gastroenterology

## 2018-07-16 DIAGNOSIS — R0602 Shortness of breath: Secondary | ICD-10-CM | POA: Diagnosis not present

## 2018-07-16 NOTE — Telephone Encounter (Signed)
Called and spoke to pt.  Relayed recommendation. She expressed understanding. Recall placed.

## 2018-07-16 NOTE — Telephone Encounter (Signed)
Colonoscopy report came in - done 09/13/2013 - Dr. Rollene Fare - [place] - good prep, 11m rectal polyp removed c/w hyperplastic polyp. No adenomas.   She had a sessile serrated polyp removed in 2009, specimen is reported to be 345min size from that exam, although we don't have that report.  Given the diminutive size of the polyp removed in 2009, and the hyperplastic polyp removed in 2015, she is not due for another screening exam until March 2025.   Stacy [name] can you please let her know, she is not due for repeat until March 2025 and place a recall? Thanks much

## 2018-07-27 ENCOUNTER — Telehealth: Payer: Self-pay

## 2018-07-27 NOTE — Telephone Encounter (Signed)
-----  Message from Roetta Sessions, [facility] sent at 07/14/2018  4:39 PM EST ----- Regarding: referral to pulmonary Patient referred to [facility] to establish care for interstitial lung disease on 07-14-2018. Appt?

## 2018-07-27 NOTE — Telephone Encounter (Signed)
Called Pulmonary. They will call pt and schedule appt.

## 2018-07-29 ENCOUNTER — Telehealth: Payer: Self-pay | Admitting: Family

## 2018-07-29 DIAGNOSIS — Z1231 Encounter for screening mammogram for malignant neoplasm of breast: Secondary | ICD-10-CM

## 2018-07-29 NOTE — Telephone Encounter (Signed)
Copied from [place] [id number]. Topic: Quick Communication - See Telephone Encounter >> Jul 29, 2018  3:36 PM Bea Graff, NT wrote: CRM for notification. See Telephone encounter for: 07/29/18. Pt would like orders for a mammogram and would like to have it done at the [place] office if possible.

## 2018-07-29 NOTE — Telephone Encounter (Signed)
Called patient and informed that mammograms are not done at this location. Advised that this would most likely be done through [place].  Can orders be placed?

## 2018-07-31 ENCOUNTER — Other Ambulatory Visit: Payer: Self-pay | Admitting: Family

## 2018-07-31 NOTE — Telephone Encounter (Signed)
Order updated;

## 2018-07-31 NOTE — Telephone Encounter (Signed)
Appt scheduled for 09/24/2018

## 2018-09-23 ENCOUNTER — Ambulatory Visit: Payer: Medicare Other

## 2018-09-24 ENCOUNTER — Institutional Professional Consult (permissible substitution): Payer: Medicare Other | Admitting: Internal Medicine

## 2018-09-25 ENCOUNTER — Other Ambulatory Visit: Payer: Self-pay | Admitting: Family

## 2018-09-25 MED ORDER — SIMVASTATIN 20 MG PO TABS: 20.0000 mg | ORAL_TABLET | Freq: Every day | ORAL | 5 refills | Status: DC

## 2018-09-30 ENCOUNTER — Encounter: Payer: Self-pay | Admitting: Pulmonary Disease

## 2018-09-30 ENCOUNTER — Ambulatory Visit (INDEPENDENT_AMBULATORY_CARE_PROVIDER_SITE_OTHER): Payer: Medicare Other | Admitting: Pulmonary Disease

## 2018-09-30 ENCOUNTER — Other Ambulatory Visit: Payer: Self-pay

## 2018-09-30 ENCOUNTER — Institutional Professional Consult (permissible substitution): Payer: Medicare Other | Admitting: Pulmonary Disease

## 2018-09-30 ENCOUNTER — Telehealth: Payer: Self-pay | Admitting: Pulmonary Disease

## 2018-09-30 VITALS — BP 132/78 | HR 72 | Ht 66.0 in | Wt 153.8 lb

## 2018-09-30 DIAGNOSIS — J8410 Pulmonary fibrosis, unspecified: Secondary | ICD-10-CM | POA: Diagnosis not present

## 2018-09-30 LAB — CBC WITH DIFFERENTIAL/PLATELET
Basophils Absolute: 0.1 10*3/uL (ref 0.0–0.1)
Basophils Relative: 0.7 % (ref 0.0–3.0)
Eosinophils Absolute: 0.1 10*3/uL (ref 0.0–0.7)
Eosinophils Relative: 0.8 % (ref 0.0–5.0)
HCT: 41.3 % (ref 36.0–46.0)
Hemoglobin: 13 g/dL (ref 12.0–15.0)
Lymphocytes Relative: 21.7 % (ref 12.0–46.0)
Lymphs Abs: 1.9 10*3/uL (ref 0.7–4.0)
MCHC: 31.4 g/dL (ref 30.0–36.0)
MCV: 77 fl — AB (ref 78.0–100.0)
Monocytes Absolute: 0.6 10*3/uL (ref 0.1–1.0)
Monocytes Relative: 6.4 % (ref 3.0–12.0)
NEUTROS ABS: 6.1 10*3/uL (ref 1.4–7.7)
Neutrophils Relative %: 70.4 % (ref 43.0–77.0)
Platelets: 342 10*3/uL (ref 150.0–400.0)
RBC: 5.37 Mil/uL — ABNORMAL HIGH (ref 3.87–5.11)
RDW: 15.6 % — ABNORMAL HIGH (ref 11.5–15.5)
WBC: 8.7 10*3/uL (ref 4.0–10.5)

## 2018-09-30 LAB — COMPREHENSIVE METABOLIC PANEL
ALT: 17 U/L (ref 0–35)
AST: 21 U/L (ref 0–37)
Albumin: 4.2 g/dL (ref 3.5–5.2)
Alkaline Phosphatase: 46 U/L (ref 39–117)
BUN: 9 mg/dL (ref 6–23)
CO2: 25 mEq/L (ref 19–32)
Calcium: 9.1 mg/dL (ref 8.4–10.5)
Chloride: 106 mEq/L (ref 96–112)
Creatinine, Ser: 0.82 mg/dL (ref 0.40–1.20)
GFR: 84.74 mL/min (ref >60.00)
Glucose, Bld: 115 mg/dL — ABNORMAL HIGH (ref 70–99)
Potassium: 3.4 mEq/L — ABNORMAL LOW (ref 3.5–5.1)
Sodium: 141 mEq/L (ref 135–145)
Total Bilirubin: 0.3 mg/dL (ref 0.2–1.2)
Total Protein: 6.9 g/dL (ref 6.0–8.3)

## 2018-09-30 MED ORDER — SULFAMETHOXAZOLE-TRIMETHOPRIM 800-160 MG PO TABS: ORAL_TABLET | ORAL | 3 refills | Status: DC

## 2018-09-30 MED ORDER — AZATHIOPRINE 50 MG PO TABS: 150.0000 mg | ORAL_TABLET | Freq: Every day | ORAL | 3 refills | Status: DC

## 2018-09-30 MED ORDER — PREDNISONE 10 MG PO TABS: 10.0000 mg | ORAL_TABLET | Freq: Every day | ORAL | 3 refills | Status: DC

## 2018-09-30 MED ORDER — PIRFENIDONE 267 MG PO TABS: 3.0000 | ORAL_TABLET | Freq: Three times a day (TID) | ORAL | 3 refills | Status: DC

## 2018-09-30 MED ORDER — AZITHROMYCIN 250 MG PO TABS: ORAL_TABLET | ORAL | 0 refills | Status: DC

## 2018-09-30 NOTE — Telephone Encounter (Signed)
[facility] [place]. J84.10 given. Nothing further needed at this time.

## 2018-09-30 NOTE — Progress Notes (Signed)
Stacy [name]    [id number]    08-31-1955  Primary Care Physician:Murray, Marvis Repress, [place]  Referring Physician: Yetta Flock, MD [address] [place], [place] [address]  Chief complaint: Consult for pulmonary fibrosis  HPI: 63 year old with history of biopsy-proven UIP pulmonary fibrosis.  This is diagnosed with a VATS biopsy in 2003 at [facility].  Followed by Dr. Allean Found, pulmonary at [facility].  She is transferring care to this area. She has been maintained on Imuran since her diagnosis of pulmonary fibrosis.  She had been tried on Ofev from 2015-18 but had to stop due to severe GI symptoms of diarrhea.  She is currently on Esbriet which she is tolerating well.  Evaluated at [facility] transplant in 2018 but deemed not a candidate as she is a Sales promotion account executive Witness  She had connective tissue disease profile, serology sent off at [facility] in 2018 which were negative.  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 ILD questionnaire for exposures 10/02/2018- negative for any exposures.  No mold, hot tub, Jacuzzi, humidifier Smoking history: 50-pack-year smoker.  Quit in 95 Travel history: Significant travel history Relevant family history: No significant family history of lung disease.   Outpatient Encounter Medications as of 09/30/2018  Medication Sig  . azaTHIOprine (IMURAN) 50 MG tablet Take 150 mg by mouth daily.   Marland Kitchen ibuprofen (ADVIL,MOTRIN) 800 MG tablet Take by mouth.  . Multiple Vitamins-Minerals (CENTRUM SILVER) tablet Take by mouth.  Marland Kitchen omeprazole (PRILOSEC) 20 MG capsule Take 1 capsule (20 mg total) by mouth daily.  . OXYGEN Inhale into the lungs. 3 liters per minute, uses when up and moving around  . Pirfenidone (ESBRIET) 267 MG TABS   . predniSONE (DELTASONE) 10 MG tablet Take by mouth.  . simvastatin (ZOCOR) 20 MG tablet Take 1 tablet (20 mg total) by mouth daily at 6 PM.  . sulfamethoxazole-trimethoprim (BACTRIM DS,SEPTRA DS)  800-160 MG tablet TAKE 1 TABLET DAILY ON MONDAY, WEDNESDAY, AND FRIDAY  . traZODone (DESYREL) 100 MG tablet Take 1 tablet (100 mg total) by mouth at bedtime as needed for sleep.  . [DISCONTINUED] formoterol (PERFOROMIST) 20 MCG/2ML nebulizer solution Inhale into the lungs.   No facility-administered encounter medications on file as of 09/30/2018.     Allergies as of 09/30/2018  . (No Known Allergies)    Past Medical History:  Diagnosis Date  . GERD (gastroesophageal reflux disease)   . Pneumonia   . Pulmonary fibrosis ([place])     Past Surgical History:  Procedure Laterality Date  . ANKLE SURGERY Left    fx, has plates  . COLONOSCOPY    . ESOPHAGOGASTRODUODENOSCOPY    . HEMORRHOID SURGERY    . LUNG BIOPSY    . TUBAL LIGATION      Family History  Problem Relation Age of Onset  . Diabetes Mother   . Cancer Father        unsure of primary source, farmer  . Asthma Brother     Social History   Socioeconomic History  . Marital status: Divorced    Spouse name: Not on file  . Number of children: 3  . Years of education: Not on file  . Highest education level: Not on file  Occupational History  . Occupation: disabled  Social Needs  . Financial resource strain: Not on file  . Food insecurity:    Worry: Not on file    Inability: Not on file  .  Transportation needs:    Medical: Not on file    Non-medical: Not on file  Tobacco Use  . Smoking status: Former Smoker    Packs/day: 2.00    Types: Cigarettes    Start date: 07/1967    Last attempt to quit: 07/1993    Years since quitting: 25.2  . Smokeless tobacco: Never Used  Substance and Sexual Activity  . Alcohol use: Yes    Comment: rarely  . Drug use: Never  . Sexual activity: Not Currently  Lifestyle  . Physical activity:    Days per week: Not on file    Minutes per session: Not on file  . Stress: Not on file  Relationships  . Social connections:    Talks on phone: Not on file    Gets together: Not on file     Attends religious service: Not on file    Active member of club or organization: Not on file    Attends meetings of clubs or organizations: Not on file    Relationship status: Not on file  . Intimate partner violence:    Fear of current or ex partner: Not on file    Emotionally abused: Not on file    Physically abused: Not on file    Forced sexual activity: Not on file  Other Topics Concern  . Not on file  Social History Narrative  . Not on file   Review of systems: Review of Systems  Constitutional: Negative for fever and chills.  HENT: Negative.   Eyes: Negative for blurred vision.  Respiratory: as per HPI  Cardiovascular: Negative for chest pain and palpitations.  Gastrointestinal: Negative for vomiting, diarrhea, blood per rectum. Genitourinary: Negative for dysuria, urgency, frequency and hematuria.  Musculoskeletal: Negative for myalgias, back pain and joint pain.  Skin: Negative for itching and rash.  Neurological: Negative for dizziness, tremors, focal weakness, seizures and loss of consciousness.  Endo/Heme/Allergies: Negative for environmental allergies.  Psychiatric/Behavioral: Negative for depression, suicidal ideas and hallucinations.  All other systems reviewed and are negative.  Physical Exam: Blood pressure 132/78, pulse 72, height _0  (1.676 m), weight 153 lb 12.8 oz (69.8 kg), SpO2 99 %. Gen:      No acute distress HEENT:  EOMI, sclera anicteric Neck:     No masses; no thyromegaly Lungs:    Clear to auscultation bilaterally; normal respiratory effort CV:         Regular rate and rhythm; no murmurs Abd:      + bowel sounds; soft, non-tender; no palpable masses, no distension Ext:    No edema; adequate peripheral perfusion Skin:      Warm and dry; no rash Neuro: alert and oriented x 3 Psych: normal mood and affect  Data Reviewed: Labs: 05/05/2017, [facility]-  Myositis panel-negative SSA less than 20, rheumatoid factor negative, ANCA-negative, ANA-negative  CCP-negative  Assessment:  Pulmonary fibrosis She has biopsy-proven UIP fibrosis.  Will obtain records from her previous pulmonologist for review She will need a reassessment including PFTs, 6-minute walk test and high-res CT It is unclear to me why she is on Imuran and prednisone unless there is clear evidence of an autoimmune process.  We will recheck serologies for connective tissue disease.  If negative then we may take her off these 2 medications and just continue with anti-fibrotics.  Plan/Recommendations: - Serologies for CTD - PFTs, 6-minute walk test, high-resolution study.  Marshell Garfinkel MD [facility] 09/30/2018, 11:17 AM  CC: Yetta Flock,  MD   Addendum: Received note from [facility] dated November 03, 2017 Per pulmonologist assessment she has been quite stable on azathioprine, prednisone, Bactrim and Esbriet.  Since she is not candidate for lung transplant and due her stability the decision was made to continue these medications.  Looks like she may need the Imuran as there was a period of time in 2018 when she was unable to obtain the azathioprine due to insurance issues and she ended up admitted in the hospital with a flare of pulmonary fibrosis.  So hence the decision is made that this is an essential medication for her.  Marshell Garfinkel MD [facility] 10/02/2018, 10:11 AM

## 2018-09-30 NOTE — Patient Instructions (Addendum)
Will prescribe a Z-Pak We will send in prescription for Imuran, Esbriet, prednisone and Bactrim We will check some labs today including comprehensive metabolic panel, CBC differential, alpha-1 antitrypsin levels and phenotype, IgE, ANA IFA, ANCA screen, rheumatoid factor, CCP, Sjogren's antibody, SCL 70  We will do 6-minute walk test, PFTs and high-resolution CT in 4 weeks.

## 2018-10-02 LAB — ANCA SCREEN W REFLEX TITER: ANCA Screen: POSITIVE — AB

## 2018-10-02 LAB — ANTI-SCLERODERMA ANTIBODY: Scleroderma (Scl-70) (ENA) Antibody, IgG: <1 AI

## 2018-10-02 LAB — ANA,IFA RA DIAG PNL W/RFLX TIT/PATN
Anti Nuclear Antibody (ANA): NEGATIVE
Cyclic Citrullin Peptide Ab: <16 UNITS
Rheumatoid fact SerPl-aCnc: <14 IU/mL (ref <14)

## 2018-10-02 LAB — SJOGREN'S SYNDROME ANTIBODS(SSA + SSB)
SSA (Ro) (ENA) Antibody, IgG: <1 AI
SSB (La) (ENA) Antibody, IgG: <1 AI

## 2018-10-03 ENCOUNTER — Other Ambulatory Visit: Payer: Self-pay | Admitting: Family

## 2018-10-03 MED ORDER — OMEPRAZOLE 20 MG PO CPDR: 20.0000 mg | DELAYED_RELEASE_CAPSULE | Freq: Every day | ORAL | 3 refills | Status: DC

## 2018-10-05 LAB — HYPERSENSITIVITY PNEUMONITIS
A. PULLULANS ABS: NEGATIVE
A.Fumigatus #1 Abs: NEGATIVE
Micropolyspora faeni, IgG: NEGATIVE
PIGEON SERUM ABS: NEGATIVE
Thermoact. Saccharii: NEGATIVE
Thermoactinomyces vulgaris, IgG: NEGATIVE

## 2018-11-16 ENCOUNTER — Other Ambulatory Visit: Payer: Self-pay | Admitting: Family

## 2018-11-16 MED ORDER — TRAZODONE HCL 100 MG PO TABS: 100.0000 mg | ORAL_TABLET | Freq: Every evening | ORAL | 0 refills | Status: DC | PRN

## 2018-11-18 DIAGNOSIS — J449 Chronic obstructive pulmonary disease, unspecified: Secondary | ICD-10-CM | POA: Diagnosis not present

## 2018-11-18 DIAGNOSIS — J45909 Unspecified asthma, uncomplicated: Secondary | ICD-10-CM | POA: Diagnosis not present

## 2018-11-20 ENCOUNTER — Telehealth: Payer: Self-pay | Admitting: *Deleted

## 2018-11-20 NOTE — Telephone Encounter (Signed)

## 2018-11-23 ENCOUNTER — Other Ambulatory Visit: Payer: Self-pay

## 2018-11-23 ENCOUNTER — Ambulatory Visit (INDEPENDENT_AMBULATORY_CARE_PROVIDER_SITE_OTHER)
Admission: RE | Admit: 2018-11-23 | Discharge: 2018-11-23 | Disposition: A | Discharge status: Home / Self Care | Payer: Medicare HMO | Source: Ambulatory Visit | Attending: Pulmonary Disease | Admitting: Pulmonary Disease

## 2018-11-23 DIAGNOSIS — J8410 Pulmonary fibrosis, unspecified: Secondary | ICD-10-CM | POA: Diagnosis not present

## 2018-11-23 DIAGNOSIS — R0602 Shortness of breath: Secondary | ICD-10-CM | POA: Diagnosis not present

## 2018-11-25 DIAGNOSIS — J449 Chronic obstructive pulmonary disease, unspecified: Secondary | ICD-10-CM | POA: Diagnosis not present

## 2018-11-26 ENCOUNTER — Telehealth: Payer: Self-pay | Admitting: Pulmonary Disease

## 2018-11-26 NOTE — Telephone Encounter (Signed)
Spoke with patient. She stated that she had recently switched over to [facility] for her insurance coverage. She stated that she had received a letter in the mail from [facility] stating that they would no longer cover her Esbriet. Advised patient that it sounds like she needs a PA and I would take care of this for her.   She provided me with her Member ID: [id number]  Attempted PA via CoverMyMeds but received a message that there was already a PA on file that was denied within the past 60 days. I do not see this documented anywhere in her chart.   Called [employer] at [phone] to check on PA. They also stated that the had a PA denial on file. Information that was submitted did not meet requirements, therefore an appeal is needed. Was advised to start the appeal online. [place] number is [phone].   Appeal was submitted online. A decision will be made within 7 days. Will keep this encounter open to follow up on this.

## 2018-11-30 DIAGNOSIS — H04123 Dry eye syndrome of bilateral lacrimal glands: Secondary | ICD-10-CM | POA: Diagnosis not present

## 2018-11-30 DIAGNOSIS — Z961 Presence of intraocular lens: Secondary | ICD-10-CM | POA: Diagnosis not present

## 2018-12-02 NOTE — Telephone Encounter (Signed)
Checked for appeal letter and not found as of today 12/02/18

## 2018-12-03 NOTE — Telephone Encounter (Signed)
No appeal letter at this time.  Will continue to follow up.

## 2018-12-07 NOTE — Telephone Encounter (Signed)
No appeal letter at this time. Con't to f/u with appeal.

## 2018-12-08 ENCOUNTER — Other Ambulatory Visit: Payer: Self-pay | Admitting: *Deleted

## 2018-12-08 MED ORDER — SULFAMETHOXAZOLE-TRIMETHOPRIM 800-160 MG PO TABS: ORAL_TABLET | ORAL | 3 refills | Status: DC

## 2018-12-08 NOTE — Telephone Encounter (Signed)
[facility] and spoke with Chrissy to check status of appeal Per Chrissy, pt's Esbriet appeal has been approved from 5.26.2020 - 12.31.2020; auth # [id number] Fax to be sent to the office  Called [facility] @ [phone] and spoke with pharmacy tech Legrand Como and informed him of the above.  Called spoke with patient who reported that she was already aware of this and has already been taking this medication.  Nothing further needed; will sign off.

## 2018-12-15 ENCOUNTER — Other Ambulatory Visit: Payer: Self-pay | Admitting: *Deleted

## 2018-12-15 MED ORDER — PREDNISONE 10 MG PO TABS: 10.0000 mg | ORAL_TABLET | Freq: Every day | ORAL | 3 refills | Status: DC

## 2018-12-16 ENCOUNTER — Ambulatory Visit (INDEPENDENT_AMBULATORY_CARE_PROVIDER_SITE_OTHER): Payer: Medicare HMO | Admitting: Internal Medicine

## 2018-12-16 ENCOUNTER — Encounter: Payer: Self-pay | Admitting: Internal Medicine

## 2018-12-16 DIAGNOSIS — R05 Cough: Secondary | ICD-10-CM | POA: Diagnosis not present

## 2018-12-16 DIAGNOSIS — R059 Cough, unspecified: Secondary | ICD-10-CM

## 2018-12-16 MED ORDER — HYDROCODONE-HOMATROPINE 5-1.5 MG/5ML PO SYRP: 5.0000 mL | ORAL_SOLUTION | Freq: Four times a day (QID) | ORAL | 0 refills | Status: AC | PRN

## 2018-12-16 MED ORDER — AZITHROMYCIN 250 MG PO TABS: ORAL_TABLET | ORAL | 1 refills | Status: DC

## 2018-12-16 NOTE — Progress Notes (Signed)
Patient ID: Stacy [name], female   DOB: [birth year], 63 y.o.   MRN: [id number]  Virtual Visit via Video Note  I connected with Stacy [name] on 12/16/18 at  7:00 PM EDT by a video enabled telemedicine application and verified that I am speaking with the correct person using two identifiers.  Location: Patient: at home Provider: at office   I discussed the limitations of evaluation and management by telemedicine and the availability of in person appointments. The patient expressed understanding and agreed to proceed.  History of Present Illness: Here with acute onset mild to mod 2-3 days ST, HA, general weakness and malaise, with prod cough greenish sputum, but Pt denies chest pain, increased sob or doe, wheezing, orthopnea, PND, increased LE swelling, palpitations, dizziness or syncope.   Pt denies polydipsia, polyuria, Past Medical History:  Diagnosis Date  . GERD (gastroesophageal reflux disease)   . Pneumonia   . Pulmonary fibrosis ([place])    Past Surgical History:  Procedure Laterality Date  . ANKLE SURGERY Left    fx, has plates  . COLONOSCOPY    . ESOPHAGOGASTRODUODENOSCOPY    . HEMORRHOID SURGERY    . LUNG BIOPSY    . TUBAL LIGATION      reports that she quit smoking about 25 years ago. Her smoking use included cigarettes. She started smoking about 51 years ago. She smoked 2.00 packs per day. She has never used smokeless tobacco. She reports current alcohol use. She reports that she does not use drugs. family history includes Asthma in her brother; Cancer in her father; Diabetes in her mother. No Known Allergies Current Outpatient Medications on File Prior to Visit  Medication Sig Dispense Refill  . azaTHIOprine (IMURAN) 50 MG tablet Take 3 tablets (150 mg total) by mouth daily. 84 tablet 3  . ibuprofen (ADVIL,MOTRIN) 800 MG tablet Take by mouth.    . Multiple Vitamins-Minerals (CENTRUM SILVER) tablet Take by mouth.    Marland Kitchen omeprazole (PRILOSEC) 20 MG capsule Take 1 capsule  (20 mg total) by mouth daily. 90 capsule 3  . OXYGEN Inhale into the lungs. 3 liters per minute, uses when up and moving around    . Pirfenidone (ESBRIET) 267 MG TABS Take 3 tablets (801 mg total) by mouth 3 (three) times daily. 252 tablet 3  . predniSONE (DELTASONE) 10 MG tablet Take 1 tablet (10 mg total) by mouth daily with breakfast. 30 tablet 3  . simvastatin (ZOCOR) 20 MG tablet Take 1 tablet (20 mg total) by mouth daily at 6 PM. 30 tablet 5  . sulfamethoxazole-trimethoprim (BACTRIM DS) 800-160 MG tablet TAKE 1 TABLET DAILY ON MONDAY, WEDNESDAY, AND FRIDAY 12 tablet 3  . traZODone (DESYREL) 100 MG tablet Take 1 tablet (100 mg total) by mouth at bedtime as needed for sleep. 90 tablet 0   No current facility-administered medications on file prior to visit.     Observations/Objective: Alert, NAD, mild ill, appropriate mood and affect, resps normal, cn 2-12 intact, moves all 4s, no visible rash or swelling Lab Results  Component Value Date   WBC 8.7 09/30/2018   HGB 13.0 09/30/2018   HCT 41.3 09/30/2018   PLT 342.0 09/30/2018   GLUCOSE 115 (H) 09/30/2018   ALT 17 09/30/2018   AST 21 09/30/2018   NA 141 09/30/2018   K 3.4 (L) 09/30/2018   CL 106 09/30/2018   CREATININE 0.82 09/30/2018   BUN 9 09/30/2018   CO2 25 09/30/2018   Assessment and Plan: See notes  Follow Up  Instructions: See notes   I discussed the assessment and treatment plan with the patient. The patient was provided an opportunity to ask questions and all were answered. The patient agreed with the plan and demonstrated an understanding of the instructions.   The patient was advised to call back or seek an in-person evaluation if the symptoms worsen or if the condition fails to improve as anticipated.   Cathlean Cower, MD

## 2018-12-16 NOTE — Patient Instructions (Signed)
Please take all new medication as prescribed - the antibiotic, and cough medicine as needed  Please continue all other medications as before, and refills have been done if requested.  Please have the pharmacy call with any other refills you may need.  Please keep your appointments with your specialists as you may have planned

## 2018-12-16 NOTE — Assessment & Plan Note (Signed)
Mild to mod, c/w bronchitis vs pna, declines cxr,  for antibx course, cough med prn, to f/u any worsening symptoms or concerns 

## 2018-12-18 ENCOUNTER — Other Ambulatory Visit: Payer: Self-pay | Admitting: Family

## 2018-12-18 MED ORDER — SIMVASTATIN 20 MG PO TABS: 20.0000 mg | ORAL_TABLET | Freq: Every day | ORAL | 0 refills | Status: DC

## 2018-12-18 MED ORDER — TRAZODONE HCL 100 MG PO TABS: 100.0000 mg | ORAL_TABLET | Freq: Every evening | ORAL | 0 refills | Status: DC | PRN

## 2018-12-19 DIAGNOSIS — J449 Chronic obstructive pulmonary disease, unspecified: Secondary | ICD-10-CM | POA: Diagnosis not present

## 2018-12-19 DIAGNOSIS — J45909 Unspecified asthma, uncomplicated: Secondary | ICD-10-CM | POA: Diagnosis not present

## 2018-12-22 DIAGNOSIS — R69 Illness, unspecified: Secondary | ICD-10-CM | POA: Diagnosis not present

## 2018-12-25 ENCOUNTER — Telehealth: Payer: Self-pay | Admitting: Family

## 2018-12-25 ENCOUNTER — Other Ambulatory Visit: Payer: Self-pay | Admitting: Family

## 2018-12-25 MED ORDER — OMEPRAZOLE 20 MG PO CPDR: 20.0000 mg | DELAYED_RELEASE_CAPSULE | Freq: Every day | ORAL | 3 refills | Status: DC

## 2018-12-25 MED ORDER — SIMVASTATIN 20 MG PO TABS: 20.0000 mg | ORAL_TABLET | Freq: Every day | ORAL | 0 refills | Status: DC

## 2018-12-25 NOTE — Telephone Encounter (Signed)
Copied from [place] [phone]. Topic: Quick Communication - Rx Refill/Question >> Dec 25, 2018  4:13 PM Blase Mess A wrote: Medication: simvastatin (ZOCOR) 20 MG tablet [[id number]] Patient is calling because the medication was sent to the wrong pharmacy  Has the patient contacted their pharmacy? Yes  (Agent: If no, request that the patient contact the pharmacy for the refill.) (Agent: If yes, when and what did the pharmacy advise?)  Preferred Pharmacy (with phone number or street name):  Agent: Please be advised that RX refills may take up to 3 business days. We ask that you follow-up with your pharmacy.

## 2018-12-25 NOTE — Telephone Encounter (Signed)
Spoke with patient and info given.

## 2018-12-25 NOTE — Telephone Encounter (Signed)
Copied from [place] [id number]. Topic: Quick Communication - Rx Refill/Question >> Dec 25, 2018  4:03 PM Blase Mess A wrote: Medication: azithromycin (ZITHROMAX) 250 MG tablet [[id number]] , omeprazole (PRILOSEC) 20 MG capsule [[id number]] ,   Has the patient contacted their pharmacy? Yes  (Agent: If no, request that the patient contact the pharmacy for the refill.) (Agent: If yes, when and what did the pharmacy advise?)  Preferred Pharmacy (with phone number or street name): [facility], [place] [phone] (Phone) [phone]    Agent: Please be advised that RX refills may take up to 3 business days. We ask that you follow-up with your pharmacy.

## 2018-12-25 NOTE — Telephone Encounter (Signed)
Copied from [place] [id number]. Topic: Quick Communication - Rx Refill/Question >> Dec 25, 2018  4:13 PM Blase Mess A wrote: Medication: simvastatin (ZOCOR) 20 MG tablet [[id number]] Patient is calling because the medication was sent to the wrong pharmacy  Has the patient contacted their pharmacy? Yes  (Agent: If no, request that the patient contact the pharmacy for the refill.) (Agent: If yes, when and what did the pharmacy advise?)  Preferred Pharmacy (with phone number or street name):[id number] [place], [place] [phone] (Phone) [phone] (Fax)    Agent: Please be advised that RX refills may take up to 3 business days. We ask that you follow-up with your pharmacy.

## 2018-12-25 NOTE — Telephone Encounter (Deleted)
Copied from [place] [phone]. Topic: Quick Communication - Rx Refill/Question >> Dec 25, 2018  4:03 PM Blase Mess A wrote: Medication: azithromycin (ZITHROMAX) 250 MG tablet [[id number]] , omeprazole (PRILOSEC) 20 MG capsule [[id number]] ,   Has the patient contacted their pharmacy? Yes  (Agent: If no, request that the patient contact the pharmacy for the refill.) (Agent: If yes, when and what did the pharmacy advise?)  Preferred Pharmacy (with phone number or street name):   Agent: Please be advised that RX refills may take up to 3 business days. We ask that you follow-up with your pharmacy.

## 2018-12-25 NOTE — Telephone Encounter (Signed)
Will send in the Omeprazole; unfortunately, cannot send in refills on Zithromax.

## 2018-12-28 ENCOUNTER — Other Ambulatory Visit: Payer: Self-pay | Admitting: Family

## 2018-12-28 MED ORDER — OMEPRAZOLE 20 MG PO CPDR: 20.0000 mg | DELAYED_RELEASE_CAPSULE | Freq: Every day | ORAL | 3 refills | Status: DC

## 2018-12-28 NOTE — Telephone Encounter (Signed)
Spoke with patient on Friday regarding refills being sent in.

## 2018-12-30 DIAGNOSIS — J449 Chronic obstructive pulmonary disease, unspecified: Secondary | ICD-10-CM | POA: Diagnosis not present

## 2019-01-06 DEATH — deceased

## 2019-01-18 DIAGNOSIS — J45909 Unspecified asthma, uncomplicated: Secondary | ICD-10-CM | POA: Diagnosis not present

## 2019-01-18 DIAGNOSIS — J449 Chronic obstructive pulmonary disease, unspecified: Secondary | ICD-10-CM | POA: Diagnosis not present

## 2019-01-19 ENCOUNTER — Telehealth: Payer: Self-pay | Admitting: Pulmonary Disease

## 2019-01-19 ENCOUNTER — Encounter: Payer: Self-pay | Admitting: General Surgery

## 2019-01-19 ENCOUNTER — Encounter: Payer: Self-pay | Admitting: Primary Care

## 2019-01-19 ENCOUNTER — Telehealth: Payer: Self-pay | Admitting: Primary Care

## 2019-01-19 ENCOUNTER — Telehealth: Payer: Self-pay | Admitting: General Surgery

## 2019-01-19 ENCOUNTER — Other Ambulatory Visit: Payer: Self-pay

## 2019-01-19 ENCOUNTER — Ambulatory Visit (INDEPENDENT_AMBULATORY_CARE_PROVIDER_SITE_OTHER): Payer: Medicare HMO | Admitting: Primary Care

## 2019-01-19 DIAGNOSIS — R0602 Shortness of breath: Secondary | ICD-10-CM

## 2019-01-19 DIAGNOSIS — Z20822 Contact with and (suspected) exposure to covid-19: Secondary | ICD-10-CM

## 2019-01-19 MED ORDER — PREDNISONE 10 MG PO TABS: ORAL_TABLET | ORAL | 0 refills | Status: DC

## 2019-01-19 NOTE — Telephone Encounter (Signed)
Called number listed which is for pt's daughter Angela Nevin but unable to reach. Left message on Carla's phone for her to return call. I did call pt on her mobile phone and was able to get ahold of her. Stated to pt that Aaron Edelman wanted [place] to schedule phone visit for her with APP and she verbalized understanding. televisit scheduled for pt with Derl Barrow at 3:30. Nothing further needed.

## 2019-01-19 NOTE — Progress Notes (Signed)
Please see telephone encounter

## 2019-01-19 NOTE — Telephone Encounter (Signed)
Schedule for televisit today, explain to the pt the limitations of a televist and the APP may still bring patient in for an in person ov in the short follow up to ensure symptom improvement.   May need to consider outpt sars cov2 testing.   Wyn Quaker [name]

## 2019-01-19 NOTE — Progress Notes (Signed)
Virtual Visit via Telephone Note  I connected with Stacy [name] on 01/19/19 at  3:30 PM EDT by telephone and verified that I am speaking with the correct person using two identifiers.  Location: Patient: Office Provider: Home   I discussed the limitations, risks, security and privacy concerns of performing an evaluation and management service by telephone and the availability of in person appointments. I also discussed with the patient that there may be a patient responsible charge related to this service. The patient expressed understanding and agreed to proceed.   History of Present Illness: 63 year old female, former smoker. PMH significant for UIP pulmonary fibrosis (VATS biopsy 2003), GERD, osteoporosis. Patient of Dr. Vaughan [name], last seen on 09/30/18. Maintained on Imuran 148m daily, Esbriet TID, Bactrim MWF and Prednisone 139mdaily. Chronic oxygen on 3.5L, not using O2 at night.   01/19/2019 Patient contacted today for televisit. Acute complaints of shortness of breath x 2 weeks.  Asspcoated chest congestion, reports that she is getting up clear mucus. Shortness of breath is mostly on exertion. Treated for bronchitis symptoms with azithromycin 1 month ago by PCP. States that she has noticed a mildew smell in bedroom since experiencing a ceiling leak 6 weeks ago. She had her bedroom professional cleaned. No known covid exposure. No vaping or smoking. Denies fever, wheezing, leg swelling.   Observations/Objective:  - No shortness of breath, wheezing or cough noted during phone conversation   Assessment and Plan:  ILD - Increase sob x 2 weeks with cough - Needs COVID testing - Rx prednisone taper, then resume 1021maily  - Checking cbc, bnp and mold panel   Follow Up Instructions:  - FU with Dr. ManVaughan [name] 2-6 weeks (next available) - Needs PFTs and 6 min walk    I discussed the assessment and treatment plan with the patient. The patient was provided an opportunity to ask  questions and all were answered. The patient agreed with the plan and demonstrated an understanding of the instructions.   The patient was advised to call back or seek an in-person evaluation if the symptoms worsen or if the condition fails to improve as anticipated.  I provided 18 minutes of non-face-to-face time during this encounter.   EliMartyn EhrichP

## 2019-01-19 NOTE — Telephone Encounter (Signed)
Pt returning your phone call

## 2019-01-19 NOTE — Patient Instructions (Addendum)
Orders: Labs (mold panel, CBC and BNP) Covid test   RX: Prednisone taper (29m x 3 days; 334mx 3 days; 2021m 3 days)- then resume 47m73mily  Follow up: If symptoms do not improve Needs visit with Dr. MannVaughan Browner2-6 weeks please

## 2019-01-19 NOTE — Telephone Encounter (Signed)
Called pt and informed her that we are no longer requiring appointments for our testing site. Pt is aware of the time when the [facility] testing site starts seeing patients and the closing time. Pt is aware to remain in her car and to wear a mask. Pt understood and had no additional questions at this time. Nothing further is needed  Lab was ordered     Reason for Visit  COVID screen request - [facility] pool Message forwarded to [facility] pool based on patient televisit this afternoon. Patient reported SOB and cough for last 2 weeks. Patient will need COVID testing. Patient aware she will be contacted by [facility] pool.  Reason for Visit History   Sent by CMA Marylyn Ishihara

## 2019-01-19 NOTE — Telephone Encounter (Signed)
Attempted to call pt but unable to reach. Left message for pt to return call. 

## 2019-01-19 NOTE — Telephone Encounter (Signed)
Primary Pulmonologist: Mannam Last office visit and with whom: Mannam 3.25.2020 What do we see them for (pulmonary problems): Pulmonary Fibrosis  Reason for call: called spoke with patient who reported that about 6 weeks her neighbor at her apartment complex had a busted sewer pipe in their bathroom that leaked into her bedroom, saturating her carpet.  Per patient, the apartment complex had the carpet cleaned but she has since noticed a mildew smell in her bedroom and now can tell a change in her breathing x2 weeks with increased SHOB and tightness.  Denies any new or active cough, wheezing, mucus production, sore throat, f/c/s (though patient does take 2 Tylenol daily for pain), n/v/d, redness around the eyes, lack of smell or taste.  Patient has not had any known COVID exposure and has not traveled.  Did explain to patient that an appt is needed; but will check with APP of the day to see if face-to-face appt is appropriate vs telephone visit (pt not able to do MyChart video visit).  Aaron Edelman please advise, thank you.  Patient was treated for bronchitis-type symptoms 1 month ago by PCP and these symptoms have since resolved.  In the last month, have you been in contact with someone who was confirmed or suspected to have [place]avirus / COVID-19?  no  Do you have any of the following symptoms developed in the last 30 days? Fever: no Cough: no Shortness of breath: yes  When did your symptoms start?  2 weeks ago  If the patient has a fever, what is the last reading?  (use n/a if patient denies fever)  n/a . IF THE PATIENT STATES THEY DO NOT OWN A THERMOMETER, THEY MUST GO AND PURCHASE ONE When did the fever start?: n/a Have you taken any medication to suppress a fever (ie Ibuprofen, Aleve, Tylenol)?: n/a

## 2019-01-20 ENCOUNTER — Encounter: Payer: Self-pay | Admitting: General Surgery

## 2019-01-20 ENCOUNTER — Telehealth: Payer: Self-pay | Admitting: General Surgery

## 2019-01-20 ENCOUNTER — Other Ambulatory Visit: Payer: Self-pay | Admitting: Family

## 2019-01-20 DIAGNOSIS — Z20822 Contact with and (suspected) exposure to covid-19: Secondary | ICD-10-CM

## 2019-01-22 ENCOUNTER — Other Ambulatory Visit: Payer: Self-pay | Admitting: Pulmonary Disease

## 2019-01-24 LAB — NOVEL CORONAVIRUS, NAA: SARS-CoV-2, NAA: NOT DETECTED

## 2019-01-28 ENCOUNTER — Other Ambulatory Visit: Payer: Self-pay

## 2019-01-28 ENCOUNTER — Ambulatory Visit (INDEPENDENT_AMBULATORY_CARE_PROVIDER_SITE_OTHER): Payer: Medicare HMO | Admitting: Pulmonary Disease

## 2019-01-28 DIAGNOSIS — R0602 Shortness of breath: Secondary | ICD-10-CM

## 2019-01-28 DIAGNOSIS — J8410 Pulmonary fibrosis, unspecified: Secondary | ICD-10-CM

## 2019-01-28 LAB — PULMONARY FUNCTION TEST
DL/VA % pred: 83 %
DL/VA: 3.46 ml/min/mmHg/L
DLCO unc % pred: 41 %
DLCO unc: 8.66 ml/min/mmHg
FEF 25-75 Post: 1.81 L/sec
FEF 25-75 Pre: 1.77 L/sec
FEF2575-%Change-Post: 2 %
FEF2575-%Pred-Post: 90 %
FEF2575-%Pred-Pre: 88 %
FEV1-%Change-Post: 2 %
FEV1-%Pred-Post: 70 %
FEV1-%Pred-Pre: 69 %
FEV1-Post: 1.49 L
FEV1-Pre: 1.46 L
FEV1FVC-%Change-Post: 1 %
FEV1FVC-%Pred-Pre: 108 %
FEV6-%Change-Post: 0 %
FEV6-%Pred-Post: 66 %
FEV6-%Pred-Pre: 65 %
FEV6-Post: 1.73 L
FEV6-Pre: 1.71 L
FEV6FVC-%Pred-Post: 103 %
FEV6FVC-%Pred-Pre: 103 %
FVC-%Change-Post: 0 %
FVC-%Pred-Post: 64 %
FVC-%Pred-Pre: 63 %
FVC-Post: 1.73 L
FVC-Pre: 1.71 L
Post FEV1/FVC ratio: 86 %
Post FEV6/FVC ratio: 100 %
Pre FEV1/FVC ratio: 85 %
Pre FEV6/FVC Ratio: 100 %
RV % pred: 35 %
RV: 0.77 L
TLC % pred: 47 %
TLC: 2.5 L

## 2019-01-28 NOTE — Progress Notes (Signed)
PFT done today. 

## 2019-01-29 DIAGNOSIS — J449 Chronic obstructive pulmonary disease, unspecified: Secondary | ICD-10-CM | POA: Diagnosis not present

## 2019-01-29 LAB — ALLERGY PANEL 11, MOLD GROUP
Allergen, A. alternata, m6: <0.1 kU/L
Allergen, Mucor Racemosus, M4: <0.1 kU/L
Aspergillus fumigatus, m3: <0.1 kU/L
CLADOSPORIUM HERBARUM (M2) IGE: <0.1 kU/L
CLASS: 0
CLASS: 0
Candida Albicans: <0.1 kU/L
Class: 0
Class: 0
Class: 0

## 2019-01-29 LAB — CBC WITH DIFFERENTIAL/PLATELET
Basophils Absolute: 0.1 10*3/uL (ref 0.0–0.1)
Basophils Relative: 0.8 % (ref 0.0–3.0)
Eosinophils Absolute: 0.1 10*3/uL (ref 0.0–0.7)
Eosinophils Relative: 0.5 % (ref 0.0–5.0)
HCT: 40.7 % (ref 36.0–46.0)
Hemoglobin: 12.3 g/dL (ref 12.0–15.0)
Lymphocytes Relative: 6.8 % — ABNORMAL LOW (ref 12.0–46.0)
Lymphs Abs: 0.9 10*3/uL (ref 0.7–4.0)
MCHC: 30.2 g/dL (ref 30.0–36.0)
MCV: 77.7 fl — ABNORMAL LOW (ref 78.0–100.0)
Monocytes Absolute: 0.5 10*3/uL (ref 0.1–1.0)
Monocytes Relative: 4.1 % (ref 3.0–12.0)
Neutro Abs: 11.1 10*3/uL — ABNORMAL HIGH (ref 1.4–7.7)
Neutrophils Relative %: 87.8 % — ABNORMAL HIGH (ref 43.0–77.0)
Platelets: 343 10*3/uL (ref 150.0–400.0)
RBC: 5.24 Mil/uL — ABNORMAL HIGH (ref 3.87–5.11)
RDW: 16.6 % — ABNORMAL HIGH (ref 11.5–15.5)
WBC: 12.6 10*3/uL — ABNORMAL HIGH (ref 4.0–10.5)

## 2019-01-29 LAB — BRAIN NATRIURETIC PEPTIDE: Pro B Natriuretic peptide (BNP): 74 pg/mL (ref 0.0–100.0)

## 2019-01-29 LAB — INTERPRETATION:

## 2019-02-05 ENCOUNTER — Other Ambulatory Visit: Payer: Self-pay | Admitting: Pulmonary Disease

## 2019-02-10 ENCOUNTER — Other Ambulatory Visit: Payer: Self-pay | Admitting: Family

## 2019-02-16 ENCOUNTER — Other Ambulatory Visit: Payer: Self-pay

## 2019-02-16 ENCOUNTER — Ambulatory Visit (INDEPENDENT_AMBULATORY_CARE_PROVIDER_SITE_OTHER): Payer: Medicare HMO | Admitting: Pulmonary Disease

## 2019-02-16 ENCOUNTER — Encounter: Payer: Self-pay | Admitting: Pulmonary Disease

## 2019-02-16 VITALS — BP 114/66 | HR 69 | Temp 98.2°F | Ht 66.0 in | Wt 157.0 lb

## 2019-02-16 DIAGNOSIS — J8410 Pulmonary fibrosis, unspecified: Secondary | ICD-10-CM | POA: Diagnosis not present

## 2019-02-16 DIAGNOSIS — R0602 Shortness of breath: Secondary | ICD-10-CM | POA: Diagnosis not present

## 2019-02-16 MED ORDER — PREDNISONE 10 MG PO TABS: 10.0000 mg | ORAL_TABLET | Freq: Every day | ORAL | 2 refills | Status: DC

## 2019-02-16 NOTE — Progress Notes (Signed)
Stacy [name]    [id number]    June 21, 1956  Primary Care Physician:Murray, Marvis Repress, Isabel  Referring Physician: Marrian Salvage, [address] [place],  [place] [address]  Chief complaint: Follow-up for IPF  HPI: 63 year old with history of biopsy-proven UIP pulmonary fibrosis.  This is diagnosed with a VATS biopsy in 2003 at [facility].  Followed by Dr. Allean Found, pulmonary at [facility].  She is transferring care to this area. She has been maintained on Imuran since her diagnosis of pulmonary fibrosis.  She had been tried on Ofev from 2015-18 but had to stop due to severe GI symptoms of diarrhea.  She is currently on Esbriet which she is tolerating well.  Evaluated at [facility] transplant in 2018 but deemed not a candidate as she is a Sales promotion account executive [employer]  She had connective tissue disease profile, serology sent off at [facility] in 2018 which were negative.  Received note from [place] clinic dated November 03, 2017 Per pulmonologist assessment she has been quite stable on azathioprine, prednisone, Bactrim and Esbriet.  Since she is not candidate for lung transplant and due her stability the decision was made to continue these medications. Assessment is that she needs Imuran as there was a period of time in 2018 when she was unable to obtain the azathioprine due to insurance issues and she ended up admitted in the hospital with a flare of pulmonary fibrosis.  So hence the decision is made that this is an essential medication for her.  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 ILD questionnaire for exposures 10/02/2018- negative for any exposures.  No mold, hot tub, Jacuzzi, humidifier Smoking history: 50-pack-year smoker.  Quit in 95 Travel history: Significant travel history Relevant family history: No significant family history of lung disease.  Interim history: Continues on azathioprine, chronic prednisone and pirfenidone without issue  States that breathing is stable with no change  Outpatient Encounter Medications as of 02/16/2019  Medication Sig  . acetaminophen (TYLENOL) 500 MG tablet Take 500 mg by mouth 2 (two) times a day. Take 3 tablets twice daily  . azaTHIOprine (IMURAN) 50 MG tablet TAKE 3 TABLETS EVERY DAY  . azithromycin (ZITHROMAX) 250 MG tablet Take as directed (Patient not taking: Reported on 01/19/2019)  . ibuprofen (ADVIL,MOTRIN) 800 MG tablet Take by mouth.  . Multiple Vitamins-Minerals (CENTRUM SILVER) tablet Take by mouth.  Marland Kitchen omeprazole (PRILOSEC) 20 MG capsule Take 1 capsule (20 mg total) by mouth daily.  . OXYGEN Inhale into the lungs. 3 liters per minute, uses when up and moving around  . Pirfenidone (ESBRIET) 267 MG TABS Take 3 tablets (801 mg total) by mouth 3 (three) times daily.  . predniSONE (DELTASONE) 10 MG tablet Take 1 tablet (10 mg total) by mouth daily with breakfast.  . predniSONE (DELTASONE) 10 MG tablet Take 4 tabs po daily x 3 days; then 3 tabs daily x3 days; then 2 tabs daily x3 days; then 1 tab daily x 3 days; then stop  . RESTASIS 0.05 % ophthalmic emulsion Take 1 drop by mouth 2 (two) times a day. Each eye  . simvastatin (ZOCOR) 20 MG tablet TAKE 1 TABLET DAILY AT 6 PM  . sulfamethoxazole-trimethoprim (BACTRIM DS) 800-160 MG tablet Take 1 tablet by mouth 3 (three) times a week. TAKE 1 TABLET EVERY DAY ON MONDAY, [place]  . traZODone (DESYREL) 100 MG tablet TAKE 1 TABLET AT BEDTIME AS NEEDED FOR SLEEP (NEED  OFFICE VISIT FOR MORE REFILLS)   No facility-administered encounter medications on file as of 02/16/2019.    Physical Exam: Blood pressure 114/66, pulse 69, temperature 98.2 F (36.8 C), temperature source Oral, height 5' 6" (1.676 m), weight 157 lb (71.2 kg), SpO2 98 %. Gen:      No acute distress HEENT:  EOMI, sclera anicteric Neck:     No masses; no thyromegaly Lungs:    Bibasilar crackles CV:         Regular rate and rhythm; no murmurs Abd:      + bowel sounds;  soft, non-tender; no palpable masses, no distension Ext:    No edema; adequate peripheral perfusion Skin:      Warm and dry; no rash Neuro: alert and oriented x 3 Psych: normal mood and affect  Data Reviewed: Imaging: CT high-resolution 11/23/2018- pulmonary fibrosis and UIP pattern.  Dilatation of pulmonary trunk suggestive of pulmonary arterial hypertension.  Aortic atherosclerosis  PFTs: 01/28/2019 FVC 1.73 [64%], FEV1 1.49 [70%], F/F 86, TLC 2.5 [47%], DLCO 8.66 [41%] Severe restriction and diffusion defect  Labs: 05/05/2017, [facility]-  Myositis panel-negative SSA less than 20, rheumatoid factor negative, ANCA-negative, ANA-negative CCP-negative  CTD serologies 09/30/2018- negative ANA, CCP, rheumatoid factor, [place], [place], SCL 70  Assessment:  IPF She has biopsy-proven UIP fibrosis.  Currently on pirfenidone She is also on Imuran and prednisone which is not standard treatment for IPF.  Per review of records from previous pulmonologist it was felt there is a component of inflammatory pneumonitis and she had a deterioration in past when Imuran was held  Given this history we will continue Imuran and prednisone for now with careful monitoring Order echocardiogram to evaluate possible pulmonary hypertension Schedule 6-minute walk test  Plan/Recommendations: - 6 MW, echo  Marshell Garfinkel MD [facility] 02/16/2019, 2:40 PM  CC: Marrian Salvage,*

## 2019-02-16 NOTE — Patient Instructions (Addendum)
Schedule you for 6-minute walk test and an echocardiogram Continue medications as tolerated. Follow-up in 3 months.

## 2019-02-18 DIAGNOSIS — J449 Chronic obstructive pulmonary disease, unspecified: Secondary | ICD-10-CM | POA: Diagnosis not present

## 2019-02-18 DIAGNOSIS — J45909 Unspecified asthma, uncomplicated: Secondary | ICD-10-CM | POA: Diagnosis not present

## 2019-03-04 ENCOUNTER — Inpatient Hospital Stay (HOSPITAL_COMMUNITY)
Admission: RE | Admit: 2019-03-04 | Discharge: 2019-03-04 | Disposition: A | Discharge status: Home / Self Care | Payer: Medicare HMO | Source: Ambulatory Visit | Attending: Pulmonary Disease | Admitting: Pulmonary Disease

## 2019-03-09 DIAGNOSIS — J449 Chronic obstructive pulmonary disease, unspecified: Secondary | ICD-10-CM | POA: Diagnosis not present

## 2019-03-10 ENCOUNTER — Other Ambulatory Visit: Payer: Self-pay

## 2019-03-10 ENCOUNTER — Ambulatory Visit (HOSPITAL_COMMUNITY)
Admission: RE | Admit: 2019-03-10 | Discharge: 2019-03-10 | Disposition: A | Discharge status: Home / Self Care | Payer: Medicare HMO | Source: Ambulatory Visit | Attending: Pulmonary Disease | Admitting: Pulmonary Disease

## 2019-03-10 ENCOUNTER — Telehealth: Payer: Self-pay | Admitting: Pulmonary Disease

## 2019-03-10 DIAGNOSIS — J8410 Pulmonary fibrosis, unspecified: Secondary | ICD-10-CM | POA: Diagnosis not present

## 2019-03-10 DIAGNOSIS — K219 Gastro-esophageal reflux disease without esophagitis: Secondary | ICD-10-CM | POA: Diagnosis not present

## 2019-03-10 DIAGNOSIS — I313 Pericardial effusion (noninflammatory): Secondary | ICD-10-CM | POA: Insufficient documentation

## 2019-03-10 DIAGNOSIS — I358 Other nonrheumatic aortic valve disorders: Secondary | ICD-10-CM | POA: Insufficient documentation

## 2019-03-10 DIAGNOSIS — I2729 Other secondary pulmonary hypertension: Secondary | ICD-10-CM | POA: Diagnosis not present

## 2019-03-10 NOTE — Telephone Encounter (Signed)
Call returned to patient, she reports she is nauseated after taking esbriet in the morning. She reports that she is drinking a ensure and she is still nauseated. She reports she takes the esbriet along with her prednisone in the mornings. She reports this has been going on for a couple weeks. She reports she never vomits just feels nauseated and because she has to take esbriet 3x/day she needs something to help.   Requesting med be sent to [facility].   PM please advise, patient requesting something to help with nausea from the esbriet.

## 2019-03-10 NOTE — Progress Notes (Signed)
  Echocardiogram 2D Echocardiogram has been performed.  Stacy [name] M 03/10/2019, 2:30 PM

## 2019-03-12 MED ORDER — ONDANSETRON HCL 4 MG PO TABS: 4.0000 mg | ORAL_TABLET | Freq: Two times a day (BID) | ORAL | 0 refills | Status: DC | PRN

## 2019-03-12 NOTE — Telephone Encounter (Signed)
Call in Zofran 4 mg PO every 12 hrs as needed for nausea

## 2019-03-12 NOTE — Telephone Encounter (Signed)
Spoke with pt and advised rx for Zofran sent to [facility]. Nothing further is needed.

## 2019-03-21 DIAGNOSIS — J45909 Unspecified asthma, uncomplicated: Secondary | ICD-10-CM | POA: Diagnosis not present

## 2019-03-21 DIAGNOSIS — J449 Chronic obstructive pulmonary disease, unspecified: Secondary | ICD-10-CM | POA: Diagnosis not present

## 2019-04-06 DIAGNOSIS — J449 Chronic obstructive pulmonary disease, unspecified: Secondary | ICD-10-CM | POA: Diagnosis not present

## 2019-04-07 DIAGNOSIS — Z961 Presence of intraocular lens: Secondary | ICD-10-CM | POA: Diagnosis not present

## 2019-04-07 DIAGNOSIS — H04121 Dry eye syndrome of right lacrimal gland: Secondary | ICD-10-CM | POA: Diagnosis not present

## 2019-04-07 DIAGNOSIS — H04123 Dry eye syndrome of bilateral lacrimal glands: Secondary | ICD-10-CM | POA: Diagnosis not present

## 2019-04-07 DIAGNOSIS — H04122 Dry eye syndrome of left lacrimal gland: Secondary | ICD-10-CM | POA: Diagnosis not present

## 2019-04-20 DIAGNOSIS — J45909 Unspecified asthma, uncomplicated: Secondary | ICD-10-CM | POA: Diagnosis not present

## 2019-04-20 DIAGNOSIS — J449 Chronic obstructive pulmonary disease, unspecified: Secondary | ICD-10-CM | POA: Diagnosis not present

## 2019-04-21 ENCOUNTER — Telehealth: Payer: Self-pay | Admitting: Pulmonary Disease

## 2019-04-21 NOTE — Telephone Encounter (Signed)
Called and spoke w/ pt briefly. Pt states she would like for our office to call her back in 10 min as she has social security on the other line. I let her know that would be fine. Will hold in triage to f/u on.

## 2019-04-21 NOTE — Telephone Encounter (Signed)
Called and spoke w/ pt. Pt states social security made an error - they have her listed as deceased - and it is causing her to not be able to obtain her Esbriet medication. She states she called social security earlier and they let her know they would be contacting [employer] with a code to fix this; however, pt states this situation has been going on for 2 months and has not been resolved. I let her know I would look into this to see if there's anything we can do to help. Pt expressed understanding.   I attempted to call the [place] office [phone]; however, I was disconnected after holding for 4 minutes.   I then called and spoke with Prescilla Sours with [place]. Latoria let me know that since pt was listed as deceased, Esbriet was d/c; therefore, they would need a new order from [place].   Dr. Vaughan Browner, please advise if you are ok with sending another order for Esbriet. Thank you.

## 2019-04-22 NOTE — Telephone Encounter (Signed)
Yes. Ok to re send order for esbriet

## 2019-04-22 NOTE — Telephone Encounter (Signed)
LMTCB x1 for pt.  

## 2019-04-23 ENCOUNTER — Other Ambulatory Visit: Payer: Self-pay | Admitting: Pulmonary Disease

## 2019-04-23 ENCOUNTER — Other Ambulatory Visit: Payer: Self-pay

## 2019-04-23 ENCOUNTER — Ambulatory Visit (INDEPENDENT_AMBULATORY_CARE_PROVIDER_SITE_OTHER): Payer: Medicare HMO

## 2019-04-23 DIAGNOSIS — Z23 Encounter for immunization: Secondary | ICD-10-CM | POA: Diagnosis not present

## 2019-04-23 MED ORDER — ESBRIET 267 MG PO TABS: 3.0000 | ORAL_TABLET | Freq: Three times a day (TID) | ORAL | 3 refills | Status: DC

## 2019-04-23 NOTE — Telephone Encounter (Signed)
Spoke with patient. She is aware that I will go ahead and send in the RX to [facility].   While on the phone, she wanted to know if she could come in for a flu vaccine. Advised patient that she could. She has been scheduled for 2pm this afternoon. She verbalized understanding.   Nothing further needed at time of call.

## 2019-04-25 ENCOUNTER — Other Ambulatory Visit: Payer: Self-pay | Admitting: Pulmonary Disease

## 2019-04-28 ENCOUNTER — Other Ambulatory Visit: Payer: Self-pay | Admitting: Family

## 2019-04-28 MED ORDER — SIMVASTATIN 20 MG PO TABS: ORAL_TABLET | ORAL | 0 refills | Status: DC

## 2019-04-28 MED ORDER — TRAZODONE HCL 100 MG PO TABS: 100.0000 mg | ORAL_TABLET | Freq: Every evening | ORAL | 0 refills | Status: DC | PRN

## 2019-04-30 ENCOUNTER — Ambulatory Visit: Payer: Medicare HMO | Admitting: Internal Medicine

## 2019-04-30 DIAGNOSIS — J449 Chronic obstructive pulmonary disease, unspecified: Secondary | ICD-10-CM | POA: Diagnosis not present

## 2019-05-19 ENCOUNTER — Other Ambulatory Visit: Payer: Self-pay

## 2019-05-19 ENCOUNTER — Ambulatory Visit (INDEPENDENT_AMBULATORY_CARE_PROVIDER_SITE_OTHER): Payer: Medicare HMO

## 2019-05-19 DIAGNOSIS — R0602 Shortness of breath: Secondary | ICD-10-CM

## 2019-05-19 DIAGNOSIS — J8410 Pulmonary fibrosis, unspecified: Secondary | ICD-10-CM

## 2019-05-21 DIAGNOSIS — J449 Chronic obstructive pulmonary disease, unspecified: Secondary | ICD-10-CM | POA: Diagnosis not present

## 2019-05-21 DIAGNOSIS — J45909 Unspecified asthma, uncomplicated: Secondary | ICD-10-CM | POA: Diagnosis not present

## 2019-06-01 DIAGNOSIS — J449 Chronic obstructive pulmonary disease, unspecified: Secondary | ICD-10-CM | POA: Diagnosis not present

## 2019-06-20 DIAGNOSIS — J449 Chronic obstructive pulmonary disease, unspecified: Secondary | ICD-10-CM | POA: Diagnosis not present

## 2019-06-20 DIAGNOSIS — J45909 Unspecified asthma, uncomplicated: Secondary | ICD-10-CM | POA: Diagnosis not present

## 2019-06-30 ENCOUNTER — Other Ambulatory Visit: Payer: Self-pay | Admitting: Family

## 2019-07-07 ENCOUNTER — Ambulatory Visit (INDEPENDENT_AMBULATORY_CARE_PROVIDER_SITE_OTHER): Payer: Medicare HMO | Admitting: Adult Health

## 2019-07-07 ENCOUNTER — Encounter: Payer: Self-pay | Admitting: Adult Health

## 2019-07-07 ENCOUNTER — Other Ambulatory Visit: Payer: Self-pay

## 2019-07-07 ENCOUNTER — Telehealth: Payer: Self-pay | Admitting: Adult Health

## 2019-07-07 DIAGNOSIS — J8410 Pulmonary fibrosis, unspecified: Secondary | ICD-10-CM | POA: Diagnosis not present

## 2019-07-07 DIAGNOSIS — J209 Acute bronchitis, unspecified: Secondary | ICD-10-CM

## 2019-07-07 MED ORDER — PREDNISONE 10 MG PO TABS: ORAL_TABLET | ORAL | 0 refills | Status: DC

## 2019-07-07 MED ORDER — AZITHROMYCIN 250 MG PO TABS: ORAL_TABLET | ORAL | 1 refills | Status: DC

## 2019-07-07 NOTE — Telephone Encounter (Signed)
Called [facility] and spoke with pharmacist Terren who is asking for clarification on patient's prednisone Rx sent earlier today - the quantity was for #20 but directions state to take 86m daily at the end of the taper.  Advised Terren that patient is on maintenance prednisone 523mdaily (last Rx was for 1056m90 in Oct 2020) and patient stated that she has plenty of her maintenance dose left at home.  Terren voiced his understanding.  Nothing further needed; will sign off.

## 2019-07-07 NOTE — Patient Instructions (Addendum)
Zpack take as directed.  Mucinex DM Twice daily  As needed  Cough/congestion .  Prednisone taper over next week then resume 42m daily . Continue on Esbriet and Imuran as taking.  Follow up in 2 weeks with Televisit and As needed   Please contact office for sooner follow up if symptoms do not improve or worsen or seek emergency care

## 2019-07-07 NOTE — Progress Notes (Signed)
Virtual Visit via Telephone Note  I connected with Stacy [name] on 07/07/19 at 10:30 AM EST by telephone and verified that I am speaking with the correct person using two identifiers.  Location: Patient: Home  Provider: Office    I discussed the limitations, risks, security and privacy concerns of performing an evaluation and management service by telephone and the availability of in person appointments. I also discussed with the patient that there may be a patient responsible charge related to this service. The patient expressed understanding and agreed to proceed.   History of Present Illness: 63 yo female  smoker followed for Pulmonary Fibrosis . 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  She denies any chest pain orthopnea PND or leg swelling.  Appetite is good.  Patient is on chronic steroids with 5 mg.  She is also on Imuran. She denies any fever, loss of taste or smell. Patient says she has been under a lot of stress.  Her grandson recently died. She also admits that she has recently smoked some marijuana. She denies any hemoptysis..  Patient Active Problem List   Diagnosis Date Noted  . Cough 12/16/2018  . Pulmonary fibrosis ([place]) 04/06/2018  . ILD (interstitial lung disease) ([place]) 04/06/2018  . Gastroesophageal reflux disease 04/06/2018  . Osteoporosis 04/06/2018   Current Outpatient Medications on File Prior to Visit  Medication Sig Dispense Refill  . acetaminophen (TYLENOL) 500 MG tablet Take 500 mg by mouth 2 (two) times a day. Take 3 tablets twice daily    . azaTHIOprine (IMURAN)  50 MG tablet TAKE 3 TABLETS EVERY DAY 270 tablet 0  . ibuprofen (ADVIL,MOTRIN) 800 MG tablet Take by mouth.    . Multiple Vitamins-Minerals (CENTRUM SILVER) tablet Take by mouth.    Marland Kitchen omeprazole (PRILOSEC) 20 MG capsule Take 1 capsule (20 mg total) by mouth daily. 90 capsule 3  . ondansetron (ZOFRAN) 4 MG tablet Take 1 tablet (4 mg total) by mouth every 12 (twelve) hours as needed for nausea or vomiting. 30 tablet 0  . OXYGEN Inhale into the lungs. 3 liters per minute, uses when up and moving around    . Pirfenidone (ESBRIET) 267 MG TABS Take 3 tablets (801 mg total) by mouth 3 (three) times daily. 252 tablet 3  . predniSONE (DELTASONE) 10 MG tablet TAKE 1 TABLET EVERY DAY WITH BREAKFAST 90 tablet 0  . RESTASIS 0.05 % ophthalmic emulsion Take 1 drop by mouth 2 (two) times a day. Each eye    . simvastatin (ZOCOR) 20 MG tablet TAKE 1 TABLET DAILY AT 6 PM 90 tablet 0  . sulfamethoxazole-trimethoprim (BACTRIM DS) 800-160 MG tablet Take 1 tablet by mouth 3 (three) times a week. TAKE 1 TABLET EVERY DAY ON MONDAY, WEDNESDAY AND FRIDAY 39 tablet 3  . traZODone (DESYREL) 100 MG tablet Take 1 tablet (100 mg total) by mouth at bedtime as needed for sleep. 90 tablet 0  . azithromycin (ZITHROMAX) 250 MG tablet Take as directed (Patient not taking: Reported on 07/07/2019) 6 tablet 1   No current facility-administered medications on file prior to visit.     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 ILD questionnaire for exposures 10/02/2018- negative for any exposures.  No mold, hot tub, Jacuzzi, humidifier Smoking history: 50-pack-year smoker.  Quit in 95 Travel history: Significant travel history Relevant family history: No significant family history of lung disease.  Previous connective tissue disease profile reported as negative  Speaks in full sentences   Assessment and Plan: Acute bronchitis-  Smoking cessation discussed  Pulmonary fibrosis-mild flare  with underlying bronchitis  Plan  Patient Instructions  Zpack take as directed.  Mucinex DM Twice daily  As needed  Cough/congestion .  Prednisone taper over next week then resume 87m daily . Continue on Esbriet and Imuran as taking.  Follow up in 2 weeks with Televisit and As needed   Please contact office for sooner follow up if symptoms do not improve or worsen or seek emergency care         Follow Up Instructions:  Follow-up in 2 weeks and as needed  Please contact office for sooner follow up if symptoms do not improve or worsen or seek emergency care   I discussed the assessment and treatment plan with the patient. The patient was provided an opportunity to ask questions and all were answered. The patient agreed with the plan and demonstrated an understanding of the instructions.   The patient was advised to call back or seek an in-person evaluation if the symptoms worsen or if the condition fails to improve as anticipated.  I provided  24 minutes of non-face-to-face time during this encounter.   TRexene Edison NP

## 2019-07-08 ENCOUNTER — Telehealth: Payer: Self-pay | Admitting: Pulmonary Disease

## 2019-07-08 MED ORDER — PREDNISONE 10 MG PO TABS: ORAL_TABLET | ORAL | 0 refills | Status: DC

## 2019-07-08 NOTE — Telephone Encounter (Signed)
Patient called into answering service regarding prednisone  Prescription sent into pharmacy for 30 mg daily for 3 days, then 20 mg for 3 days then 10 mg for 3 days.  To resume her usual 5 mg daily following completion of the taper

## 2019-07-09 ENCOUNTER — Other Ambulatory Visit: Payer: Self-pay | Admitting: Pulmonary Disease

## 2019-07-13 ENCOUNTER — Telehealth: Payer: Self-pay | Admitting: Pulmonary Disease

## 2019-07-13 NOTE — Telephone Encounter (Signed)
Called patient, she states she already talked to someone from our office and resent the RX in. Verified the medication was sent in yesterday 07/12/19 nothing further needed at this time.

## 2019-07-19 ENCOUNTER — Ambulatory Visit (INDEPENDENT_AMBULATORY_CARE_PROVIDER_SITE_OTHER): Payer: Medicare HMO | Admitting: Primary Care

## 2019-07-19 ENCOUNTER — Telehealth: Payer: Self-pay | Admitting: Pulmonary Disease

## 2019-07-19 ENCOUNTER — Encounter: Payer: Self-pay | Admitting: Primary Care

## 2019-07-19 DIAGNOSIS — M546 Pain in thoracic spine: Secondary | ICD-10-CM

## 2019-07-19 DIAGNOSIS — M549 Dorsalgia, unspecified: Secondary | ICD-10-CM | POA: Diagnosis not present

## 2019-07-19 DIAGNOSIS — J209 Acute bronchitis, unspecified: Secondary | ICD-10-CM | POA: Diagnosis not present

## 2019-07-19 NOTE — Patient Instructions (Signed)
Checking CXR and labs to rule out other causes of back pain including pneumonia or pulmonary embolism   If testing normal will be ok to send in prescription for muscle relaxer or something else for pain   Use heat or ice for pain 82mn three times a day (which ever is more comfortable)  If you can take ibuprofen take as needed for back pain with food   If breathing acutely worsen present to ED   Acute Bronchitis, Adult  Acute bronchitis is when air tubes in the lungs (bronchi) suddenly get swollen. The condition can make it hard for you to breathe. In adults, acute bronchitis usually goes away within 2 weeks. A cough caused by bronchitis may last up to 3 weeks. Smoking, allergies, and asthma can make the condition worse. What are the causes? This condition is caused by:  Cold and flu viruses. The most common cause of this condition is the virus that causes the common cold.  Bacteria.  Substances that irritate the lungs, including: ? Smoke from cigarettes and other types of tobacco. ? Dust and pollen. ? Fumes from chemicals, gases, or burned fuel. ? Other materials that pollute indoor or outdoor air.  Close contact with someone who has acute bronchitis. What increases the risk? The following factors may make you more likely to develop this condition:  A weak body's defense system. This is also called the immune system.  Any condition that affects your lungs and breathing, such as asthma. What are the signs or symptoms? Symptoms of this condition include:  A cough.  Coughing up clear, yellow, or green mucus.  Wheezing.  Chest congestion.  Shortness of breath.  A fever.  Body aches.  Chills.  A sore throat. How is this treated? Acute bronchitis may go away over time without treatment. Your doctor may recommend:  Drinking more fluids.  Taking a medicine for a fever or cough.  Using a device that gets medicine into your lungs (inhaler).  Using a vaporizer or  a humidifier. These are machines that add water or moisture in the air to help with coughing and poor breathing. Follow these instructions at home:  Activity  Get a lot of rest.  Avoid places where there are fumes from chemicals.  Return to your normal activities as told by your doctor. Ask your doctor what activities are safe for you. Lifestyle  Drink enough fluids to keep your pee (urine) pale yellow.  Do not drink alcohol.  Do not use any products that contain nicotine or tobacco, such as cigarettes, e-cigarettes, and chewing tobacco. If you need help quitting, ask your doctor. Be aware that: ? Your bronchitis will get worse if you smoke or breathe in other people's smoke (secondhand smoke). ? Your lungs will heal faster if you quit smoking. General instructions  Take over-the-counter and prescription medicines only as told by your doctor.  Use an inhaler, cool mist vaporizer, or humidifier as told by your doctor.  Rinse your mouth often with salt water. To make salt water, dissolve -1 tsp (3-6 g) of salt in 1 cup (237 mL) of warm water.  Keep all follow-up visits as told by your doctor. This is important. How is this prevented? To lower your risk of getting this condition again:  Wash your hands often with soap and water. If soap and water are not available, use hand sanitizer.  Avoid contact with people who have cold symptoms.  Try not to touch your mouth, nose, or eyes with  your hands.  Make sure to get the flu shot every year. Contact a doctor if:  Your symptoms do not get better in 2 weeks.  You vomit more than once or twice.  You have symptoms of loss of fluid from your body (dehydration). These include: ? Dark urine. ? Dry skin or eyes. ? Increased thirst. ? Headaches. ? Confusion. ? Muscle cramps. Get help right away if:  You cough up blood.  You have chest pain.  You have very bad shortness of breath.  You become dehydrated.  You faint or keep  feeling like you are going to faint.  You keep vomiting.  You have a very bad headache.  Your fever or chills get worse. These symptoms may be an emergency. Do not wait to see if the symptoms will go away. Get medical help right away. Call your local emergency services (911 in the [place] Do not drive yourself to the hospital. Summary  Acute bronchitis is when air tubes in the lungs (bronchi) suddenly get swollen. In adults, acute bronchitis usually goes away within 2 weeks.  Take over-the-counter and prescription medicines only as told by your doctor.  Drink enough fluid to keep your pee (urine) pale yellow.  Contact a doctor if your symptoms do not improve after 2 weeks of treatment.  Get help right away if you cough up blood, faint, or have chest pain or shortness of breath. This information is not intended to replace advice given to you by your health care provider. Make sure you discuss any questions you have with your health care provider. Document Revised: 01/15/2019 Document Reviewed: 01/15/2019 Elsevier Patient Education  [place].   Acute Back Pain, Adult Acute back pain is sudden and usually short-lived. It is often caused by an injury to the muscles and tissues in the back. The injury may result from:  A muscle or ligament getting overstretched or torn (strained). Ligaments are tissues that connect bones to each other. Lifting something improperly can cause a back strain.  Wear and tear (degeneration) of the spinal disks. Spinal disks are circular tissue that provides cushioning between the bones of the spine (vertebrae).  Twisting motions, such as while playing sports or doing yard work.  A hit to the back.  Arthritis. You may have a physical exam, lab tests, and imaging tests to find the cause of your pain. Acute back pain usually goes away with rest and home care. Follow these instructions at home: Managing pain, stiffness, and swelling  Take  over-the-counter and prescription medicines only as told by your health care provider.  Your health care provider may recommend applying ice during the first 24-48 hours after your pain starts. To do this: ? Put ice in a plastic bag. ? Place a towel between your skin and the bag. ? Leave the ice on for 20 minutes, 2-3 times a day.  If directed, apply heat to the affected area as often as told by your health care provider. Use the heat source that your health care provider recommends, such as a moist heat pack or a heating pad. ? Place a towel between your skin and the heat source. ? Leave the heat on for 20-30 minutes. ? Remove the heat if your skin turns bright red. This is especially important if you are unable to feel pain, heat, or cold. You have a greater risk of getting burned. Activity   Do not stay in bed. Staying in bed for more than 1-2 days  can delay your recovery.  Sit up and stand up straight. Avoid leaning forward when you sit, or hunching over when you stand. ? If you work at a desk, sit close to it so you do not need to lean over. Keep your chin tucked in. Keep your neck drawn back, and keep your elbows bent at a right angle. Your arms should look like the letter "L." ? Sit high and close to the steering wheel when you drive. Add lower back (lumbar) support to your car seat, if needed.  Take short walks on even surfaces as soon as you are able. Try to increase the length of time you walk each day.  Do not sit, drive, or stand in one place for more than 30 minutes at a time. Sitting or standing for long periods of time can put stress on your back.  Do not drive or use heavy machinery while taking prescription pain medicine.  Use proper lifting techniques. When you bend and lift, use positions that put less stress on your back: ? Slater your knees. ? Keep the load close to your body. ? Avoid twisting.  Exercise regularly as told by your health care provider. Exercising  helps your back heal faster and helps prevent back injuries by keeping muscles strong and flexible.  Work with a physical therapist to make a safe exercise program, as recommended by your health care provider. Do any exercises as told by your physical therapist. Lifestyle  Maintain a healthy weight. Extra weight puts stress on your back and makes it difficult to have good posture.  Avoid activities or situations that make you feel anxious or stressed. Stress and anxiety increase muscle tension and can make back pain worse. Learn ways to manage anxiety and stress, such as through exercise. General instructions  Sleep on a firm mattress in a comfortable position. Try lying on your side with your knees slightly bent. If you lie on your back, put a pillow under your knees.  Follow your treatment plan as told by your health care provider. This may include: ? Cognitive or behavioral therapy. ? Acupuncture or massage therapy. ? Meditation or yoga. Contact a health care provider if:  You have pain that is not relieved with rest or medicine.  You have increasing pain going down into your legs or buttocks.  Your pain does not improve after 2 weeks.  You have pain at night.  You lose weight without trying.  You have a fever or chills. Get help right away if:  You develop new bowel or bladder control problems.  You have unusual weakness or numbness in your arms or legs.  You develop nausea or vomiting.  You develop abdominal pain.  You feel faint. Summary  Acute back pain is sudden and usually short-lived.  Use proper lifting techniques. When you bend and lift, use positions that put less stress on your back.  Take over-the-counter and prescription medicines and apply heat or ice as directed by your health care provider. This information is not intended to replace advice given to you by your health care provider. Make sure you discuss any questions you have with your health care  provider. Document Revised: 10/13/2018 Document Reviewed: 02/05/2017 Elsevier Patient Education  [place].

## 2019-07-19 NOTE — Progress Notes (Addendum)
Virtual Visit via Telephone Note  I connected with Stacy [name] on 07/19/19 at  3:30 PM EST by telephone and verified that I am speaking with the correct person using two identifiers.  Location: Patient: Home Provider: [facility]   I discussed the limitations, risks, security and privacy concerns of performing an evaluation and management service by telephone and the availability of in person appointments. I also discussed with the patient that there may be a patient responsible charge related to this service. The patient expressed understanding and agreed to proceed.   History of Present Illness: 64 year old female, former smoker. PMH significant for UIP pulmonary fibrosis (VATS biopsy 2003), GERD, osteoporosis. Patient of Dr. Vaughan Browner, last seen by pulmonary NP on 07/07/19 for acute bronchitis. Maintained on Imuran 139m daily, Esbriet TID, Bactrim MWF and Prednisone 538mdaily. She is on chronic oxygen on 4L.    07/19/2019 Patient contacted today for acute teleivist. Reports that she developed left upper back pain from coughing 1 week ago. She has been taking "a muscle relaxer", Flexeril, from her granddaughter at night with some improvement in her symptoms and is requesting a refill of this medication. Tylenol does not help. States that her cough and shortness of breath have improved since completing z-pack and prednisone taper prescribed on 07/07/19 but symptoms have not resolved 100%. Cough is productive with cream colored mucus. Denies fever, chills, sweats, HA, sinus congestion or change in smell/taste.   Observations/Objective:  - No significant sob, cough or wheezing noted during phone conversation - Remains on 4L oxygen, unable to check pulse ox  Assessment and Plan:  Bronchitis: - Improved after completing zpack and prednisone taper  Acute back pain: - Likely d/t coughing - Checking CXR and didmer to r/o PNA or PE  - If testing within normal, ok sending prescription for  muscle relaxer   Follow Up Instructions:  - As needed if symptoms do not improve or worsen - FU after testing    I discussed the assessment and treatment plan with the patient. The patient was provided an opportunity to ask questions and all were answered. The patient agreed with the plan and demonstrated an understanding of the instructions.   The patient was advised to call back or seek an in-person evaluation if the symptoms worsen or if the condition fails to improve as anticipated.  I provided 22 minutes of non-face-to-face time during this encounter.   ElMartyn EhrichNP

## 2019-07-19 NOTE — Telephone Encounter (Signed)
Spoke with the pt  She c/o pulled muscle in her back that she states she developed from cough  She has been taking her grand daughters muscle relaxer and states this has helped some but she is now out  She takes tyenol without relief  Telephone visit with [name] today

## 2019-07-20 ENCOUNTER — Other Ambulatory Visit (INDEPENDENT_AMBULATORY_CARE_PROVIDER_SITE_OTHER): Payer: Medicare HMO

## 2019-07-20 ENCOUNTER — Encounter (HOSPITAL_COMMUNITY): Payer: Self-pay | Admitting: Emergency Medicine

## 2019-07-20 ENCOUNTER — Telehealth: Payer: Self-pay | Admitting: Primary Care

## 2019-07-20 ENCOUNTER — Telehealth: Payer: Self-pay | Admitting: General Surgery

## 2019-07-20 ENCOUNTER — Inpatient Hospital Stay (HOSPITAL_COMMUNITY)
Admission: EM | Admit: 2019-07-20 | Discharge: 2019-07-25 | DRG: 193 | Disposition: A | Discharge status: Home / Self Care | Payer: Medicare HMO | Attending: Internal Medicine | Admitting: Internal Medicine

## 2019-07-20 ENCOUNTER — Ambulatory Visit (INDEPENDENT_AMBULATORY_CARE_PROVIDER_SITE_OTHER): Payer: Medicare HMO

## 2019-07-20 ENCOUNTER — Other Ambulatory Visit: Payer: Self-pay

## 2019-07-20 ENCOUNTER — Emergency Department (HOSPITAL_COMMUNITY): Payer: Medicare HMO

## 2019-07-20 DIAGNOSIS — J449 Chronic obstructive pulmonary disease, unspecified: Secondary | ICD-10-CM | POA: Diagnosis not present

## 2019-07-20 DIAGNOSIS — Z79899 Other long term (current) drug therapy: Secondary | ICD-10-CM | POA: Diagnosis not present

## 2019-07-20 DIAGNOSIS — Z9981 Dependence on supplemental oxygen: Secondary | ICD-10-CM | POA: Diagnosis not present

## 2019-07-20 DIAGNOSIS — J9611 Chronic respiratory failure with hypoxia: Secondary | ICD-10-CM | POA: Diagnosis not present

## 2019-07-20 DIAGNOSIS — Z833 Family history of diabetes mellitus: Secondary | ICD-10-CM

## 2019-07-20 DIAGNOSIS — J8410 Pulmonary fibrosis, unspecified: Secondary | ICD-10-CM | POA: Diagnosis present

## 2019-07-20 DIAGNOSIS — M546 Pain in thoracic spine: Secondary | ICD-10-CM

## 2019-07-20 DIAGNOSIS — Z825 Family history of asthma and other chronic lower respiratory diseases: Secondary | ICD-10-CM | POA: Diagnosis not present

## 2019-07-20 DIAGNOSIS — J9601 Acute respiratory failure with hypoxia: Secondary | ICD-10-CM | POA: Diagnosis not present

## 2019-07-20 DIAGNOSIS — J9621 Acute and chronic respiratory failure with hypoxia: Secondary | ICD-10-CM | POA: Diagnosis not present

## 2019-07-20 DIAGNOSIS — R0602 Shortness of breath: Secondary | ICD-10-CM | POA: Diagnosis not present

## 2019-07-20 DIAGNOSIS — D509 Iron deficiency anemia, unspecified: Secondary | ICD-10-CM | POA: Diagnosis present

## 2019-07-20 DIAGNOSIS — Z87891 Personal history of nicotine dependence: Secondary | ICD-10-CM

## 2019-07-20 DIAGNOSIS — J9620 Acute and chronic respiratory failure, unspecified whether with hypoxia or hypercapnia: Secondary | ICD-10-CM | POA: Diagnosis not present

## 2019-07-20 DIAGNOSIS — R7989 Other specified abnormal findings of blood chemistry: Secondary | ICD-10-CM | POA: Diagnosis present

## 2019-07-20 DIAGNOSIS — R05 Cough: Secondary | ICD-10-CM | POA: Diagnosis not present

## 2019-07-20 DIAGNOSIS — J209 Acute bronchitis, unspecified: Secondary | ICD-10-CM

## 2019-07-20 DIAGNOSIS — M810 Age-related osteoporosis without current pathological fracture: Secondary | ICD-10-CM | POA: Diagnosis not present

## 2019-07-20 DIAGNOSIS — Z79890 Hormone replacement therapy (postmenopausal): Secondary | ICD-10-CM | POA: Diagnosis not present

## 2019-07-20 DIAGNOSIS — Z20822 Contact with and (suspected) exposure to covid-19: Secondary | ICD-10-CM | POA: Diagnosis not present

## 2019-07-20 DIAGNOSIS — J9622 Acute and chronic respiratory failure with hypercapnia: Secondary | ICD-10-CM

## 2019-07-20 DIAGNOSIS — D849 Immunodeficiency, unspecified: Secondary | ICD-10-CM | POA: Diagnosis not present

## 2019-07-20 DIAGNOSIS — J189 Pneumonia, unspecified organism: Secondary | ICD-10-CM | POA: Diagnosis not present

## 2019-07-20 DIAGNOSIS — J45909 Unspecified asthma, uncomplicated: Secondary | ICD-10-CM | POA: Diagnosis not present

## 2019-07-20 DIAGNOSIS — K219 Gastro-esophageal reflux disease without esophagitis: Secondary | ICD-10-CM | POA: Diagnosis not present

## 2019-07-20 DIAGNOSIS — Z7952 Long term (current) use of systemic steroids: Secondary | ICD-10-CM | POA: Diagnosis not present

## 2019-07-20 DIAGNOSIS — I272 Pulmonary hypertension, unspecified: Secondary | ICD-10-CM | POA: Diagnosis not present

## 2019-07-20 LAB — CBC WITH DIFFERENTIAL/PLATELET
Abs Immature Granulocytes: 0.16 10*3/uL — ABNORMAL HIGH (ref 0.00–0.07)
Basophils Absolute: 0.1 10*3/uL (ref 0.0–0.1)
Basophils Absolute: 0.1 10*3/uL (ref 0.0–0.1)
Basophils Relative: 0.6 % (ref 0.0–3.0)
Basophils Relative: 0 %
Eosinophils Absolute: 0.1 10*3/uL (ref 0.0–0.7)
Eosinophils Absolute: 0.1 10*3/uL (ref 0.0–0.5)
Eosinophils Relative: 0.4 % (ref 0.0–5.0)
Eosinophils Relative: 0 %
HCT: 39.3 % (ref 36.0–46.0)
HCT: 39.6 % (ref 36.0–46.0)
Hemoglobin: 11.8 g/dL — ABNORMAL LOW (ref 12.0–15.0)
Hemoglobin: 12.1 g/dL (ref 12.0–15.0)
Immature Granulocytes: 1 %
Lymphocytes Relative: 11.7 % — ABNORMAL LOW (ref 12.0–46.0)
Lymphocytes Relative: 10 %
Lymphs Abs: 2.4 10*3/uL (ref 0.7–4.0)
Lymphs Abs: 1.8 10*3/uL (ref 0.7–4.0)
MCH: 23.6 pg — ABNORMAL LOW (ref 26.0–34.0)
MCHC: 30.1 g/dL (ref 30.0–36.0)
MCHC: 30.6 g/dL (ref 30.0–36.0)
MCV: 76.8 fl — ABNORMAL LOW (ref 78.0–100.0)
MCV: 77.3 fL — ABNORMAL LOW (ref 80.0–100.0)
Monocytes Absolute: 1.1 10*3/uL — ABNORMAL HIGH (ref 0.1–1.0)
Monocytes Absolute: 1.1 10*3/uL — ABNORMAL HIGH (ref 0.1–1.0)
Monocytes Relative: 5.5 % (ref 3.0–12.0)
Monocytes Relative: 6 %
Neutro Abs: 16.8 10*3/uL — ABNORMAL HIGH (ref 1.4–7.7)
Neutro Abs: 15.8 10*3/uL — ABNORMAL HIGH (ref 1.7–7.7)
Neutrophils Relative %: 81.8 % — ABNORMAL HIGH (ref 43.0–77.0)
Neutrophils Relative %: 83 %
Platelets: 691 10*3/uL — ABNORMAL HIGH (ref 150.0–400.0)
Platelets: 641 10*3/uL — ABNORMAL HIGH (ref 150–400)
RBC: 5.11 Mil/uL (ref 3.87–5.11)
RBC: 5.12 MIL/uL — ABNORMAL HIGH (ref 3.87–5.11)
RDW: 14.6 % (ref 11.5–15.5)
RDW: 14.5 % (ref 11.5–15.5)
WBC: 20.6 10*3/uL (ref 4.0–10.5)
WBC: 19 10*3/uL — ABNORMAL HIGH (ref 4.0–10.5)
nRBC: 0 % (ref 0.0–0.2)

## 2019-07-20 LAB — RESPIRATORY PANEL BY RT PCR (FLU A&B, COVID)
Influenza A by PCR: NEGATIVE
Influenza B by PCR: NEGATIVE
SARS Coronavirus 2 by RT PCR: NEGATIVE

## 2019-07-20 LAB — D-DIMER, QUANTITATIVE: D-Dimer, Quant: 3.76 mcg/mL FEU — ABNORMAL HIGH (ref <0.50)

## 2019-07-20 LAB — BASIC METABOLIC PANEL
Anion gap: 11 (ref 5–15)
BUN: 7 mg/dL — ABNORMAL LOW (ref 8–23)
CO2: 28 mmol/L (ref 22–32)
Calcium: 9.3 mg/dL (ref 8.9–10.3)
Chloride: 101 mmol/L (ref 98–111)
Creatinine, Ser: 0.74 mg/dL (ref 0.44–1.00)
GFR calc Af Amer: >60 mL/min (ref >60)
GFR calc non Af Amer: >60 mL/min (ref >60)
Glucose, Bld: 128 mg/dL — ABNORMAL HIGH (ref 70–99)
Potassium: 4.1 mmol/L (ref 3.5–5.1)
Sodium: 140 mmol/L (ref 135–145)

## 2019-07-20 LAB — POC SARS CORONAVIRUS 2 AG -  ED: SARS Coronavirus 2 Ag: NEGATIVE

## 2019-07-20 MED ORDER — SODIUM CHLORIDE 0.9% FLUSH
3.0000 mL | Freq: Once | INTRAVENOUS | Status: AC
  Administered 2019-07-20: 18:00:00 3 mL via INTRAVENOUS

## 2019-07-20 MED ORDER — SODIUM CHLORIDE 0.9 % IV SOLN
1.0000 g | Freq: Once | INTRAVENOUS | Status: AC
  Administered 2019-07-20: 1 g via INTRAVENOUS
  Filled 2019-07-20: qty 10

## 2019-07-20 MED ORDER — IPRATROPIUM-ALBUTEROL 0.5-2.5 (3) MG/3ML IN SOLN
3.0000 mL | Freq: Four times a day (QID) | RESPIRATORY_TRACT | Status: DC
  Administered 2019-07-20 – 2019-07-22 (×9): 3 mL via RESPIRATORY_TRACT
  Filled 2019-07-20 (×9): qty 3

## 2019-07-20 MED ORDER — SODIUM CHLORIDE 0.9 % IV BOLUS
1000.0000 mL | Freq: Once | INTRAVENOUS | Status: AC
  Administered 2019-07-20: 18:00:00 1000 mL via INTRAVENOUS

## 2019-07-20 MED ORDER — LEVOFLOXACIN 500 MG PO TABS: 500.0000 mg | ORAL_TABLET | Freq: Every day | ORAL | 0 refills | Status: DC

## 2019-07-20 MED ORDER — SODIUM CHLORIDE 0.9 % IV SOLN
500.0000 mg | Freq: Once | INTRAVENOUS | Status: AC
  Administered 2019-07-20: 18:00:00 500 mg via INTRAVENOUS
  Filled 2019-07-20: qty 500

## 2019-07-20 MED ORDER — BUDESONIDE 0.25 MG/2ML IN SUSP
0.2500 mg | Freq: Two times a day (BID) | RESPIRATORY_TRACT | Status: DC
  Administered 2019-07-21 – 2019-07-25 (×9): 0.25 mg via RESPIRATORY_TRACT
  Filled 2019-07-20 (×11): qty 2

## 2019-07-20 MED ORDER — IPRATROPIUM-ALBUTEROL 0.5-2.5 (3) MG/3ML IN SOLN
3.0000 mL | Freq: Four times a day (QID) | RESPIRATORY_TRACT | Status: DC | PRN
  Filled 2019-07-20: qty 3

## 2019-07-20 MED ORDER — IOHEXOL 350 MG/ML SOLN
100.0000 mL | Freq: Once | INTRAVENOUS | Status: AC | PRN
  Administered 2019-07-20: 100 mL via INTRAVENOUS

## 2019-07-20 NOTE — Telephone Encounter (Signed)
Message below forwarded to Derl Barrow, NP:  [name], I received call report from [facility] with radiology department. Confirmed report information was received in EPIC. She stated radiologist stressed impression #1 and 3 to be focused on most by provider based on results.

## 2019-07-20 NOTE — Telephone Encounter (Signed)
Can you please call patient and tell her to go ED. WBC is 20 and D-dimer was positive, highly concerning for PE. CXR also showed Right upper and lower lobe pneumonia. Needs close observation

## 2019-07-20 NOTE — ED Triage Notes (Signed)
Pt reports she was called by her PCP to come to the ER because her chest xray showed a blood clot in her lungs along with pneumonia. She is oxygen dependant at 3-4 L [place]. Speaks clear sentences. She is also febrile.

## 2019-07-20 NOTE — Progress Notes (Signed)
She Needs ED eval

## 2019-07-20 NOTE — Progress Notes (Signed)
Needs stat CTA chest r/o PE. Already telephone call- please make sure done. Thanks

## 2019-07-20 NOTE — Telephone Encounter (Signed)
Received call report from [facility] with [facility] on patient's WBC (20.6) done on 07/19/20. Beth  please review the result/impression copied below:    Please advise, thank you.

## 2019-07-20 NOTE — ED Notes (Signed)
Lactic, 1 set of blood cultures and 1 gold top sent to the lab for hold.

## 2019-07-20 NOTE — ED Provider Notes (Signed)
[place] EMERGENCY DEPARTMENT Provider Note   CSN: [id number] Arrival date & time: 07/20/19  1520     History Chief Complaint  Patient presents with  . Pulmonary Embolism  . Shortness of Breath  . Fever    Stacy [name] is a 64 y.o. female with a history of pulmonary fibrosis on 4 L nasal cannula baseline, presenting to the emergency department with shortness of breath and lab abnormalities.  Patient reports he was called today and told to come to the emergency department by her pulmonology office, with concerns for an elevated D-dimer and possible pneumonia seen on her chest x-ray.  They referred her over to rule out PE.  Patient reports to me that for the past 3 to 4 weeks she has had progressively worsening shortness of breath.  She is required up to 6 L of nasal cannula with activity, normally she is on 4 L at all times.  She also reports some intermittent fevers and chills as well as myalgias in her lower back.  She is also had a nonproductive cough for the past 3 to 4 weeks.  She says that her family members come visit her multiple times in the daytime, although she lives alone.  She denies any prior history of DVT or PE.  She denies any leg swelling in either calf.  NKDA  HPI     Past Medical History:  Diagnosis Date  . GERD (gastroesophageal reflux disease)   . Pneumonia   . Pulmonary fibrosis [facility])     Patient Active Problem List   Diagnosis Date Noted  . Community acquired pneumonia 07/20/2019  . Cough 12/16/2018  . Pulmonary fibrosis ([place]) 04/06/2018  . ILD (interstitial lung disease) ([place]) 04/06/2018  . Gastroesophageal reflux disease 04/06/2018  . Osteoporosis 04/06/2018    Past Surgical History:  Procedure Laterality Date  . ANKLE SURGERY Left    fx, has plates  . COLONOSCOPY    . ESOPHAGOGASTRODUODENOSCOPY    . HEMORRHOID SURGERY    . LUNG BIOPSY    . TUBAL LIGATION       OB History   No obstetric history on file.      Family History  Problem Relation Age of Onset  . Diabetes Mother   . Cancer Father        unsure of primary source, farmer  . Asthma Brother     Social History   Tobacco Use  . Smoking status: Former Smoker    Packs/day: 2.00    Types: Cigarettes    Start date: 07/1967    Quit date: 07/1993    Years since quitting: 26.0  . Smokeless tobacco: Never Used  Substance Use Topics  . Alcohol use: Yes    Comment: rarely  . Drug use: Never    Home Medications Prior to Admission medications   Medication Sig Start Date End Date Taking? Authorizing Provider  acetaminophen (TYLENOL) 500 MG tablet Take 500 mg by mouth 2 (two) times a day. Take 3 tablets twice daily   Yes [provider]  azaTHIOprine (IMURAN) 50 MG tablet TAKE 3 TABLETS EVERY DAY Patient taking differently: Take 50 mg by mouth 3 (three) times daily.  07/12/19  Yes Mannam, Praveen, MD  levofloxacin (LEVAQUIN) 500 MG tablet Take 1 tablet (500 mg total) by mouth daily. 07/20/19  Yes Martyn Ehrich, NP  Multiple Vitamins-Minerals (CENTRUM SILVER) tablet Take by mouth.   Yes [provider]  omeprazole (PRILOSEC) 20 MG capsule Take 1  capsule (20 mg total) by mouth daily. 12/28/18  Yes Marrian Salvage, [name]  OXYGEN Inhale into the lungs. 3 liters per minute, uses when up and moving around   Yes [provider]  Pirfenidone (ESBRIET) 267 MG TABS Take 3 tablets (801 mg total) by mouth 3 (three) times daily. 04/23/19  Yes Mannam, Praveen, MD  predniSONE (DELTASONE) 5 MG tablet Take 5 mg by mouth daily with breakfast.   Yes [provider]  simvastatin (ZOCOR) 20 MG tablet TAKE 1 TABLET DAILY AT 6PM (NO FURTHER REFILLS WITHOUT OFFICE VISIT) 07/08/19  Yes Biagio Borg, MD  sulfamethoxazole-trimethoprim (BACTRIM DS) 800-160 MG tablet Take 1 tablet by mouth 3 (three) times a week. TAKE 1 TABLET EVERY DAY ON MONDAY, WEDNESDAY AND FRIDAY 01/22/19  Yes Mannam, Praveen, MD  traZODone (DESYREL)  100 MG tablet TAKE 1 TABLET (100 MG TOTAL) BY MOUTH AT BEDTIME AS NEEDED FOR SLEEP (NO FURTHER REFILLS WITHOUT OFFICE VISIT) Patient taking differently: Take 100 mg by mouth at bedtime as needed for sleep.  07/08/19  Yes Biagio Borg, MD    Allergies    Patient has no known allergies.  Review of Systems   Review of Systems  Constitutional: Positive for chills, fatigue and fever.  Eyes: Negative for photophobia and visual disturbance.  Respiratory: Positive for cough and shortness of breath.   Cardiovascular: Negative for chest pain and palpitations.  Gastrointestinal: Negative for abdominal pain and vomiting.  Musculoskeletal: Positive for arthralgias, back pain and myalgias.  Skin: Negative for color change and rash.  Neurological: Negative for syncope and headaches.  Psychiatric/Behavioral: Negative for agitation and confusion.  All other systems reviewed and are negative.   Physical Exam Updated Vital Signs BP (!) 112/98   Pulse 92   Temp (!) 100.5 F (38.1 C) (Oral)   Resp (!) 24   SpO2 93%   Physical Exam Vitals and nursing note reviewed.  Constitutional:      General: She is not in acute distress.    Appearance: She is well-developed.  HENT:     Head: Normocephalic and atraumatic.  Eyes:     Conjunctiva/sclera: Conjunctivae normal.  Cardiovascular:     Rate and Rhythm: Normal rate and regular rhythm.     Pulses: Normal pulses.  Pulmonary:     Effort: Pulmonary effort is normal. No respiratory distress.     Comments: 97% on 6L [place] Abdominal:     Palpations: Abdomen is soft.     Tenderness: There is no abdominal tenderness.  Musculoskeletal:     Cervical back: Neck supple.     Right lower leg: No tenderness.     Left lower leg: No tenderness. No edema.  Skin:    General: Skin is warm and dry.  Neurological:     General: No focal deficit present.     Mental Status: She is alert and oriented to person, place, and time.  Psychiatric:        Mood and Affect:  Mood normal.        Behavior: Behavior normal.     ED Results / Procedures / Treatments   Labs (all labs ordered are listed, but only abnormal results are displayed) Labs Reviewed  BASIC METABOLIC PANEL - Abnormal; Notable for the following components:      Result Value   Glucose, Bld 128 (*)    BUN 7 (*)    All other components within normal limits  CBC WITH DIFFERENTIAL/PLATELET - Abnormal; Notable for the  following components:   WBC 19.0 (*)    RBC 5.12 (*)    MCV 77.3 (*)    MCH 23.6 (*)    Platelets 641 (*)    Neutro Abs 15.8 (*)    Monocytes Absolute 1.1 (*)    Abs Immature Granulocytes 0.16 (*)    All other components within normal limits  RESPIRATORY PANEL BY RT PCR (FLU A&B, COVID)  POC SARS CORONAVIRUS 2 AG -  ED    EKG EKG Interpretation  Date/Time:  Tuesday July 20 2019 15:58:09 EST Ventricular Rate:  98 PR Interval:  132 QRS Duration: 116 QT Interval:  350 QTC Calculation: 446 R Axis:   171 Text Interpretation: Normal sinus rhythm Right axis deviation Incomplete right bundle branch block , plus right ventricular hypertrophy Abnormal ECG No STEMI Confirmed by Octaviano Glow [id number]) on 07/20/2019 4:18:03 PM   Radiology Chest xray  Result Date: 07/20/2019 CLINICAL DATA:  Cough.  History of pulmonary fibrosis EXAM: CHEST - 2 VIEW COMPARISON:  Chest CT Nov 23, 2018 FINDINGS: There is extensive underlying pulmonary fibrosis. There is an area of increased opacity in the posterior segment of the left upper lobe measuring 3.8 x 2.9 x 2.8 cm. There is consolidation in portions of the right upper lobe and right base. Heart size and pulmonary vascularity are normal. No adenopathy evident. No bone lesions. IMPRESSION: 1. Small irregular nodular appearing opacity in the posterior segment of the left upper lobe measuring 3.8 x 2.9 x 2.8 cm. Focal neoplasm must be of concern given this appearance. Advise contrast enhanced chest CT to further assess. 2. Extensive fibrotic  change bilaterally, grossly stable from prior study. 3. Areas of consolidation concerning for pneumonia in portions of the right upper lobe and right base. 4.  Cardiac silhouette normal.  No evident adenopathy. These results will be called to the ordering clinician or representative by the Radiologist Assistant, and communication documented in the PACS or zVision Dashboard. Electronically Signed   By: Lowella Grip III M.D.   On: 07/20/2019 11:24   CT Angio Chest PE W and/or Wo Contrast  Result Date: 07/20/2019 CLINICAL DATA:  64 year old female with shortness of breath and fever. Positive D-dimer. Concern for pulmonary embolism. EXAM: CT ANGIOGRAPHY CHEST WITH CONTRAST TECHNIQUE: Multidetector CT imaging of the chest was performed using the standard protocol during bolus administration of intravenous contrast. Multiplanar CT image reconstructions and MIPs were obtained to evaluate the vascular anatomy. CONTRAST:  143m OMNIPAQUE IOHEXOL 350 MG/ML SOLN COMPARISON:  Chest CT dated 11/23/2018. FINDINGS: Cardiovascular: There is mild cardiomegaly. There is retrograde flow of contrast from the right atrium into the IVC in keeping with right heart dysfunction. No pericardial effusion. Mild atherosclerotic calcification of the thoracic aorta. Mildly dilated main pulmonary trunk suggestive of a degree of pulmonary hypertension. Clinical correlation is recommended. Evaluation of the pulmonary arteries is somewhat limited due to respiratory motion artifact and suboptimal visualization of the peripheral branches. No large central pulmonary artery embolus identified. Mediastinum/Nodes: Bilateral hilar adenopathy measure up to 15 mm in short axis on the left. The esophagus is grossly unremarkable. Asymmetric enlargement of the right thyroid lobe, likely related to underlying nodule. Ultrasound may provide better evaluation. No mediastinal fluid collection. Lungs/Pleura: Background of interstitial lung disease with  subpleural reticulation and honeycombing. There is a large area of consolidative change in the left upper lobe with air bronchogram most consistent with pneumonia. Clinical correlation and follow-up to resolution recommended to exclude an underlying mass. There is no  pleural effusion or pneumothorax. The central airways are patent. Upper Abdomen: Partially visualized 5.5 cm cyst in the interpolar aspect of the right kidney. Musculoskeletal: No chest wall abnormality. No acute or significant osseous findings. Review of the MIP images confirms the above findings. IMPRESSION: 1. Left upper lobe pneumonia. Clinical correlation and follow-up to resolution recommended. 2. No large central pulmonary artery embolus. 3. Mild cardiomegaly with findings of right heart dysfunction. Correlation with echocardiogram recommended. 4. Changes of pulmonary fibrosis. 5. Bilateral hilar adenopathy, reactive. Electronically Signed   By: Anner Crete M.D.   On: 07/20/2019 18:33    Procedures Procedures (including critical care time)  Medications Ordered in ED Medications  ipratropium-albuterol (DUONEB) 0.5-2.5 (3) MG/3ML nebulizer solution 3 mL (3 mLs Nebulization Given 07/20/19 2118)  ipratropium-albuterol (DUONEB) 0.5-2.5 (3) MG/3ML nebulizer solution 3 mL (has no administration in time range)  budesonide (PULMICORT) nebulizer solution 0.25 mg (0.25 mg Nebulization Not Given 07/20/19 2138)  sodium chloride flush (NS) 0.9 % injection 3 mL (3 mLs Intravenous Given 07/20/19 1735)  cefTRIAXone (ROCEPHIN) 1 g in sodium chloride 0.9 % 100 mL IVPB (0 g Intravenous Stopped 07/20/19 1838)  azithromycin (ZITHROMAX) 500 mg in sodium chloride 0.9 % 250 mL IVPB (0 mg Intravenous Stopped 07/20/19 2109)  sodium chloride 0.9 % bolus 1,000 mL (1,000 mLs Intravenous New Bag/Given 07/20/19 1734)  iohexol (OMNIPAQUE) 350 MG/ML injection 100 mL (100 mLs Intravenous Contrast Given 07/20/19 1821)    ED Course  I have reviewed the triage vital  signs and the nursing notes.  Pertinent labs & imaging results that were available during my care of the patient were reviewed by me and considered in my medical decision making (see chart for details).  64 year old female presenting to ED with shortness of breath, cough, myalgias, fevers and chills for the past 3 to 4 weeks.  Reportedly had an elevated D-dimer as an outpatient during testing yesterday.  Also reportedly had concern for pneumonia on her x-ray.  Here the patient is febrile.  She is requiring 6 L nasal cannula to maintain her oxygen saturation.  She otherwise does not show any signs of acute respiratory distress.  Plan to obtain basic lab work as well as a CT PE study.  Also get a rapid Covid test, is a high clinical concern for Covid.  Stacy [name] was evaluated in Emergency Department on 07/21/2019 for the symptoms described in the history of present illness. She was evaluated in the context of the global COVID-19 pandemic, which necessitated consideration that the patient might be at risk for infection with the SARS-CoV-2 virus that causes COVID-19. Institutional protocols and algorithms that pertain to the evaluation of patients at risk for COVID-19 are in a state of rapid change based on information released by regulatory bodies including the CDC and federal and state organizations. These policies and algorithms were followed during the patient's care in the ED.   Clinical Course as of Jul 20 98  Tue Jul 20, 2019  6301 No evident PE but given her PNA, age and risk factors, and new O2 requirement, reasonable to admit to hospitalist for antibiotics.  I do not suspect she has bacterial sepsis.  Sending Flu/Covid PCR and she remains PUI at the time   [MT]  1949 Signout given to hospitalist   [MT]    Clinical Course User Index [MT] Marzell Allemand, Carola Rhine, MD    Final Clinical Impression(s) / ED Diagnoses Final diagnoses:  Community acquired pneumonia of left upper lobe of lung  Rx / DC Orders ED Discharge Orders    None       Shaquil Aldana, Carola Rhine, MD 07/21/19 0100

## 2019-07-20 NOTE — Telephone Encounter (Signed)
I already discussed results with patient. Sending in antibiotic for right upper/lower pneumonia. Please order CTA chest to r/o PE and evaluated new LUL opacity/nodule.   Needs FU televisit in 10 days

## 2019-07-20 NOTE — Telephone Encounter (Signed)
Message already addressed see previous note

## 2019-07-20 NOTE — Progress Notes (Signed)
See telephone note, discussed with patient. Sending in Abx and ordering CTA

## 2019-07-20 NOTE — Telephone Encounter (Signed)
Yup, I have already sent a telephone note to triage. She needs to go to ED for positive D-dimer, concern for acute LUL PE and new pneumonia on right. Needs higher level of care.

## 2019-07-20 NOTE — H&P (Signed)
History and Physical  Stacy [name] [id number]:[id number] DOB: January 10, 1956 DOA: 07/20/2019  Referring physician: ER provider PCP: Marrian Salvage, [place]  Outpatient Specialists: Pulmonologist, Dr. Tammi Klippel. Patient coming from: Home.  Chief Complaint: Shortness of breath and elevated D-dimer.  HPI:  Patient is a 64 year old African-American female with past medical history significant for pulmonary fibrosis, chronic respiratory failure for which patient has been on 4-5 L of supplemental oxygen at home and moderate pulmonary hypertension.  Patient presents with progressive dyspnea/SOB and weakness over the last 3 to 4 weeks that have gotten worse over the last 3 to 4 days.  Patient has been needing 6 L of supplemental oxygen at home.  There is associated fever, cough that is productive of beige phlegms.  D-dimer was also noted to be elevated (3.76), and patient was advised to come to the hospital for further assessment and management.  CTA of the chest done was negative for large central pulmonary embolism.  CTA chest revealed bilateral hilar adenopathy, asymmetric right thyroid lobe enlargement, interstitial lung disease changes and left upper lung lobe endobronchial program.  Worsening leukocytosis is noted.  No headache, no neck pain, no chest pain, no GI symptoms or urinary symptoms.  Patient be admitted for further assessment and management.  ED Course: On presentation to the hospital, temperature was 100.5 F, heart rate of 89, respiratory rate of 31, blood pressure 137/83 with O2 sat of 94%.  Patient has received IV Rocephin and ceftriaxone.  Hospitalist team has been asked to admit patient for further assessment and management.  Pertinent labs: Chemistry reveals sodium of 140, potassium of 4.1, chloride 101, CO2 28, BUN of 7, creatinine of 0.74 with a blood sugar of 128.  WBC of 19, hemoglobin of 12.1, hematocrit of 39.6, MCV of 77.3 with platelet count of 641.  D-dimer is 3.76.  EKG:  Independently reviewed.   Imaging: independently reviewed.   Review of Systems:  Negative for fever, visual changes, sore throat, rash, new muscle aches, chest pain, dysuria, bleeding, n/v/abdominal pain.  Past Medical History:  Diagnosis Date  . GERD (gastroesophageal reflux disease)   . Pneumonia   . Pulmonary fibrosis ([place])     Past Surgical History:  Procedure Laterality Date  . ANKLE SURGERY Left    fx, has plates  . COLONOSCOPY    . ESOPHAGOGASTRODUODENOSCOPY    . HEMORRHOID SURGERY    . LUNG BIOPSY    . TUBAL LIGATION       reports that she quit smoking about 26 years ago. Her smoking use included cigarettes. She started smoking about 52 years ago. She smoked 2.00 packs per day. She has never used smokeless tobacco. She reports current alcohol use. She reports that she does not use drugs.  No Known Allergies  Family History  Problem Relation Age of Onset  . Diabetes Mother   . Cancer Father        unsure of primary source, farmer  . Asthma Brother      Prior to Admission medications   Medication Sig Start Date End Date Taking? Authorizing Provider  acetaminophen (TYLENOL) 500 MG tablet Take 500 mg by mouth 2 (two) times a day. Take 3 tablets twice daily   Yes [provider]  azaTHIOprine (IMURAN) 50 MG tablet TAKE 3 TABLETS EVERY DAY Patient taking differently: Take 50 mg by mouth 3 (three) times daily.  07/12/19  Yes Mannam, Praveen, MD  levofloxacin (LEVAQUIN) 500 MG tablet Take 1 tablet (500 mg total) by mouth daily.  07/20/19  Yes Martyn Ehrich, NP  Multiple Vitamins-Minerals (CENTRUM SILVER) tablet Take by mouth.   Yes [provider]  omeprazole (PRILOSEC) 20 MG capsule Take 1 capsule (20 mg total) by mouth daily. 12/28/18  Yes Marrian Salvage, [name]  OXYGEN Inhale into the lungs. 3 liters per minute, uses when up and moving around   Yes [provider]  Pirfenidone (ESBRIET) 267 MG TABS Take 3 tablets (801 mg total) by mouth  3 (three) times daily. 04/23/19  Yes Mannam, Praveen, MD  predniSONE (DELTASONE) 5 MG tablet Take 5 mg by mouth daily with breakfast.   Yes [provider]  simvastatin (ZOCOR) 20 MG tablet TAKE 1 TABLET DAILY AT 6PM (NO FURTHER REFILLS WITHOUT OFFICE VISIT) 07/08/19  Yes Biagio Borg, MD  sulfamethoxazole-trimethoprim (BACTRIM DS) 800-160 MG tablet Take 1 tablet by mouth 3 (three) times a week. TAKE 1 TABLET EVERY DAY ON MONDAY, WEDNESDAY AND FRIDAY 01/22/19  Yes Mannam, Praveen, MD  traZODone (DESYREL) 100 MG tablet TAKE 1 TABLET (100 MG TOTAL) BY MOUTH AT BEDTIME AS NEEDED FOR SLEEP (NO FURTHER REFILLS WITHOUT OFFICE VISIT) Patient taking differently: Take 100 mg by mouth at bedtime as needed for sleep.  07/08/19  Yes Biagio Borg, MD    Physical Exam: Vitals:   07/20/19 1700 07/20/19 1715 07/20/19 1730 07/20/19 1745  BP: 111/77 109/72 121/73 124/75  Pulse: 84 84 88 91  Resp: (!) 25 (!) 30 (!) 27 (!) 24  Temp:      TempSrc:      SpO2: 95% 98% 99% 94%    Constitutional:  . Appears calm and comfortable Eyes:  Marland Kitchen Mild pallor. No jaundice.  ENMT:  . external ears, nose appear normal Neck:  . Neck is supple. No JVD Respiratory:  . Decreased air entry globally Cardiovascular:  . S1S2 . No LE extremity edema   Abdomen:  . Abdomen is obese, soft and non tender. Organs are difficult to assess. Neurologic:  . Awake and alert. . Moves all limbs.  Wt Readings from Last 3 Encounters:  02/16/19 71.2 kg  09/30/18 69.8 kg  07/14/18 69.9 kg    I have personally reviewed following labs and imaging studies  Labs on Admission:  CBC: Recent Labs  Lab 07/20/19 1103 07/20/19 1620  WBC 20.6 Repeated and verified X2.* 19.0*  NEUTROABS 16.8* 15.8*  HGB 11.8* 12.1  HCT 39.3 39.6  MCV 76.8* 77.3*  PLT 691.0* 001*   Basic Metabolic Panel: Recent Labs  Lab 07/20/19 1620  NA 140  K 4.1  CL 101  CO2 28  GLUCOSE 128*  BUN 7*  CREATININE 0.74  CALCIUM 9.3   Liver  Function Tests: No results for input(s): AST, ALT, ALKPHOS, BILITOT, PROT, ALBUMIN in the last 168 hours. No results for input(s): LIPASE, AMYLASE in the last 168 hours. No results for input(s): AMMONIA in the last 168 hours. Coagulation Profile: No results for input(s): INR, PROTIME in the last 168 hours. Cardiac Enzymes: No results for input(s): CKTOTAL, CKMB, CKMBINDEX, TROPONINI in the last 168 hours. BNP (last 3 results) Recent Labs    01/28/19 1602  PROBNP 74.0   HbA1C: No results for input(s): HGBA1C in the last 72 hours. CBG: No results for input(s): GLUCAP in the last 168 hours. Lipid Profile: No results for input(s): CHOL, HDL, LDLCALC, TRIG, CHOLHDL, LDLDIRECT in the last 72 hours. Thyroid Function Tests: No results for input(s): TSH, T4TOTAL, FREET4, T3FREE, THYROIDAB in the last 72 hours.  Anemia Panel: No results for input(s): VITAMINB12, FOLATE, FERRITIN, TIBC, IRON, RETICCTPCT in the last 72 hours. Urine analysis: No results found for: COLORURINE, APPEARANCEUR, LABSPEC, PHURINE, GLUCOSEU, HGBUR, BILIRUBINUR, KETONESUR, PROTEINUR, UROBILINOGEN, NITRITE, LEUKOCYTESUR Sepsis Labs: _0 (procalcitonin:4,lacticidven:4) )No results found for this or any previous visit (from the past 240 hour(s)).    Radiological Exams on Admission: Chest xray  Result Date: 07/20/2019 CLINICAL DATA:  Cough.  History of pulmonary fibrosis EXAM: CHEST - 2 VIEW COMPARISON:  Chest CT Nov 23, 2018 FINDINGS: There is extensive underlying pulmonary fibrosis. There is an area of increased opacity in the posterior segment of the left upper lobe measuring 3.8 x 2.9 x 2.8 cm. There is consolidation in portions of the right upper lobe and right base. Heart size and pulmonary vascularity are normal. No adenopathy evident. No bone lesions. IMPRESSION: 1. Small irregular nodular appearing opacity in the posterior segment of the left upper lobe measuring 3.8 x 2.9 x 2.8 cm. Focal neoplasm must be of  concern given this appearance. Advise contrast enhanced chest CT to further assess. 2. Extensive fibrotic change bilaterally, grossly stable from prior study. 3. Areas of consolidation concerning for pneumonia in portions of the right upper lobe and right base. 4.  Cardiac silhouette normal.  No evident adenopathy. These results will be called to the ordering clinician or representative by the Radiologist Assistant, and communication documented in the PACS or zVision Dashboard. Electronically Signed   By: Lowella Grip III M.D.   On: 07/20/2019 11:24   CT Angio Chest PE W and/or Wo Contrast  Result Date: 07/20/2019 CLINICAL DATA:  64 year old female with shortness of breath and fever. Positive D-dimer. Concern for pulmonary embolism. EXAM: CT ANGIOGRAPHY CHEST WITH CONTRAST TECHNIQUE: Multidetector CT imaging of the chest was performed using the standard protocol during bolus administration of intravenous contrast. Multiplanar CT image reconstructions and MIPs were obtained to evaluate the vascular anatomy. CONTRAST:  187m OMNIPAQUE IOHEXOL 350 MG/ML SOLN COMPARISON:  Chest CT dated 11/23/2018. FINDINGS: Cardiovascular: There is mild cardiomegaly. There is retrograde flow of contrast from the right atrium into the IVC in keeping with right heart dysfunction. No pericardial effusion. Mild atherosclerotic calcification of the thoracic aorta. Mildly dilated main pulmonary trunk suggestive of a degree of pulmonary hypertension. Clinical correlation is recommended. Evaluation of the pulmonary arteries is somewhat limited due to respiratory motion artifact and suboptimal visualization of the peripheral branches. No large central pulmonary artery embolus identified. Mediastinum/Nodes: Bilateral hilar adenopathy measure up to 15 mm in short axis on the left. The esophagus is grossly unremarkable. Asymmetric enlargement of the right thyroid lobe, likely related to underlying nodule. Ultrasound may provide better  evaluation. No mediastinal fluid collection. Lungs/Pleura: Background of interstitial lung disease with subpleural reticulation and honeycombing. There is a large area of consolidative change in the left upper lobe with air bronchogram most consistent with pneumonia. Clinical correlation and follow-up to resolution recommended to exclude an underlying mass. There is no pleural effusion or pneumothorax. The central airways are patent. Upper Abdomen: Partially visualized 5.5 cm cyst in the interpolar aspect of the right kidney. Musculoskeletal: No chest wall abnormality. No acute or significant osseous findings. Review of the MIP images confirms the above findings. IMPRESSION: 1. Left upper lobe pneumonia. Clinical correlation and follow-up to resolution recommended. 2. No large central pulmonary artery embolus. 3. Mild cardiomegaly with findings of right heart dysfunction. Correlation with echocardiogram recommended. 4. Changes of pulmonary fibrosis. 5. Bilateral hilar adenopathy, reactive. Electronically Signed   By: AMilas Hock  Radparvar M.D.   On: 07/20/2019 18:33    EKG: Independently reviewed.   Active Problems:   Community acquired pneumonia   Assessment/Plan Acute on chronic respiratory failure/pulmonary fibrosis with worsening symptoms: -Admit patient for further assessment and management. -Treat underlying possible pneumonia -Nebs DuoNeb -Continue steroids, but increase dose -Low threshold to consult pulmonary team -Guarded prognosis  Pneumonia (left upper lobe): -Treat patient for possible community-acquired pneumonia -Rule out aspiration component to the pneumonic process. -Assume patient may be immunocompromised -Panculture patient -Start patient on IV Rocephin and azithromycin -Further management depend on hospital course.  Elevated D-dimer: -CTA of the chest is negative for large central pulmonary embolism.  DVT prophylaxis: Subacute Lovenox Code Status: Full code Family  Communication:  Disposition Plan: This will depend on hospital course Consults called: Have low threshold to consult pulmonary team Admission status: Inpatient  Time spent: 65 minutes  Dana Allan, MD  [facility] Pager #: [phone] 7PM-7AM contact night coverage as above  07/20/2019, 8:11 PM

## 2019-07-20 NOTE — Telephone Encounter (Signed)
I called and spoke with the pt and notified of recs per [facility]  She will head to ED now

## 2019-07-20 NOTE — ED Notes (Signed)
Pt. Used bedside commode and de-stated into the high 70's low 80's. Helped back in bed and repositioned, back to 97% on 4L. Vitals now WDL

## 2019-07-21 ENCOUNTER — Other Ambulatory Visit: Payer: Self-pay

## 2019-07-21 DIAGNOSIS — J9611 Chronic respiratory failure with hypoxia: Secondary | ICD-10-CM

## 2019-07-21 DIAGNOSIS — J8410 Pulmonary fibrosis, unspecified: Secondary | ICD-10-CM

## 2019-07-21 LAB — CBC
HCT: 34.2 % — ABNORMAL LOW (ref 36.0–46.0)
Hemoglobin: 10.7 g/dL — ABNORMAL LOW (ref 12.0–15.0)
MCH: 23.4 pg — ABNORMAL LOW (ref 26.0–34.0)
MCHC: 31.3 g/dL (ref 30.0–36.0)
MCV: 74.7 fL — ABNORMAL LOW (ref 80.0–100.0)
Platelets: 535 10*3/uL — ABNORMAL HIGH (ref 150–400)
RBC: 4.58 MIL/uL (ref 3.87–5.11)
RDW: 14.4 % (ref 11.5–15.5)
WBC: 16.9 10*3/uL — ABNORMAL HIGH (ref 4.0–10.5)
nRBC: 0 % (ref 0.0–0.2)

## 2019-07-21 LAB — URINALYSIS, COMPLETE (UACMP) WITH MICROSCOPIC
Bilirubin Urine: NEGATIVE
Glucose, UA: NEGATIVE mg/dL
Hgb urine dipstick: NEGATIVE
Ketones, ur: 5 mg/dL — AB
Nitrite: NEGATIVE
Protein, ur: NEGATIVE mg/dL
Specific Gravity, Urine: 1.013 (ref 1.005–1.030)
pH: 6 (ref 5.0–8.0)

## 2019-07-21 LAB — BASIC METABOLIC PANEL
Anion gap: 9 (ref 5–15)
BUN: <5 mg/dL — ABNORMAL LOW (ref 8–23)
CO2: 27 mmol/L (ref 22–32)
Calcium: 8.5 mg/dL — ABNORMAL LOW (ref 8.9–10.3)
Chloride: 102 mmol/L (ref 98–111)
Creatinine, Ser: 0.72 mg/dL (ref 0.44–1.00)
GFR calc Af Amer: >60 mL/min (ref >60)
GFR calc non Af Amer: >60 mL/min (ref >60)
Glucose, Bld: 93 mg/dL (ref 70–99)
Potassium: 4.1 mmol/L (ref 3.5–5.1)
Sodium: 138 mmol/L (ref 135–145)

## 2019-07-21 LAB — PHOSPHORUS: Phosphorus: 3.3 mg/dL (ref 2.5–4.6)

## 2019-07-21 LAB — MAGNESIUM: Magnesium: 1.8 mg/dL (ref 1.7–2.4)

## 2019-07-21 LAB — HIV ANTIBODY (ROUTINE TESTING W REFLEX): HIV Screen 4th Generation wRfx: NONREACTIVE

## 2019-07-21 LAB — MRSA PCR SCREENING: MRSA by PCR: NEGATIVE

## 2019-07-21 LAB — PROCALCITONIN: Procalcitonin: 0.16 ng/mL

## 2019-07-21 MED ORDER — PIRFENIDONE 267 MG PO TABS
3.0000 | ORAL_TABLET | Freq: Three times a day (TID) | ORAL | Status: DC
  Administered 2019-07-21 – 2019-07-25 (×11): 801 mg via ORAL
  Filled 2019-07-21 (×10): qty 3

## 2019-07-21 MED ORDER — PANTOPRAZOLE SODIUM 40 MG PO TBEC
40.0000 mg | DELAYED_RELEASE_TABLET | Freq: Every day | ORAL | Status: DC
  Administered 2019-07-21 – 2019-07-25 (×5): 40 mg via ORAL
  Filled 2019-07-21 (×5): qty 1

## 2019-07-21 MED ORDER — ENOXAPARIN SODIUM 40 MG/0.4ML ~~LOC~~ SOLN
40.0000 mg | SUBCUTANEOUS | Status: DC
  Administered 2019-07-21 – 2019-07-24 (×4): 40 mg via SUBCUTANEOUS
  Filled 2019-07-21 (×4): qty 0.4

## 2019-07-21 MED ORDER — SIMVASTATIN 20 MG PO TABS
20.0000 mg | ORAL_TABLET | Freq: Every day | ORAL | Status: DC
  Administered 2019-07-21 – 2019-07-24 (×4): 20 mg via ORAL
  Filled 2019-07-21 (×4): qty 1

## 2019-07-21 MED ORDER — SODIUM CHLORIDE 0.9 % IV SOLN
500.0000 mg | INTRAVENOUS | Status: DC
  Administered 2019-07-21 – 2019-07-24 (×4): 500 mg via INTRAVENOUS
  Filled 2019-07-21 (×5): qty 500

## 2019-07-21 MED ORDER — SULFAMETHOXAZOLE-TRIMETHOPRIM 800-160 MG PO TABS
1.0000 | ORAL_TABLET | ORAL | Status: DC
  Administered 2019-07-21 – 2019-07-23 (×2): 1 via ORAL
  Filled 2019-07-21 (×2): qty 1

## 2019-07-21 MED ORDER — AZATHIOPRINE 50 MG PO TABS
50.0000 mg | ORAL_TABLET | Freq: Three times a day (TID) | ORAL | Status: DC
  Administered 2019-07-21 – 2019-07-25 (×12): 50 mg via ORAL
  Filled 2019-07-21 (×17): qty 1

## 2019-07-21 MED ORDER — ACETAMINOPHEN 325 MG PO TABS
650.0000 mg | ORAL_TABLET | Freq: Once | ORAL | Status: AC
  Administered 2019-07-21: 650 mg via ORAL
  Filled 2019-07-21: qty 2

## 2019-07-21 MED ORDER — SODIUM CHLORIDE 0.9 % IV SOLN
1.0000 g | INTRAVENOUS | Status: DC
  Administered 2019-07-21 – 2019-07-24 (×4): 1 g via INTRAVENOUS
  Filled 2019-07-21 (×4): qty 1
  Filled 2019-07-21: qty 10

## 2019-07-21 MED ORDER — TRAZODONE HCL 100 MG PO TABS
100.0000 mg | ORAL_TABLET | Freq: Every evening | ORAL | Status: DC | PRN
  Administered 2019-07-21 – 2019-07-22 (×2): 100 mg via ORAL
  Filled 2019-07-21 (×2): qty 1

## 2019-07-21 MED ORDER — PREDNISONE 20 MG PO TABS
60.0000 mg | ORAL_TABLET | Freq: Every day | ORAL | Status: DC
  Administered 2019-07-21 – 2019-07-25 (×5): 60 mg via ORAL
  Filled 2019-07-21 (×5): qty 3

## 2019-07-21 MED ORDER — TRAMADOL HCL 50 MG PO TABS
50.0000 mg | ORAL_TABLET | Freq: Two times a day (BID) | ORAL | Status: AC | PRN
  Administered 2019-07-21: 50 mg via ORAL
  Filled 2019-07-21: qty 1

## 2019-07-21 NOTE — Progress Notes (Signed)
Pt brought in her home medication "Esbriet tabs". RN and Pt counted medication, and RN turned medication in to pharmacy. White slip was placed in Pt's chart.

## 2019-07-21 NOTE — TOC Initial Note (Signed)
Transition of Care  [facility]) - Initial/Assessment Note    Patient Details  Name: Stacy [name] MRN: [id number] Date of Birth: 1956/01/12  Transition of Care [facility]) CM/SW Contact:    Alexander Mt, [place] Phone Number: 07/21/2019, 1:23 PM  Clinical Narrative:                 CSW met with pt at bedside. Introduced self, role, reason for visit. Pt from home alone, her daughter lives in [place] as does her adult granddaughter who can pick her up at discharge. Confirmed home address and PCP, pt agreeable to appointment being made. Pt utilizes 4-5 L o2 at home through [facility]. She denies any needs around transportation or medication access. She ambulates without any assistance or DME. No requests for DME noted.   CSW to make PCP appointment. Pt feeling better, knows that a [facility] team member is available should she have any additional questions.   Expected Discharge Plan: Home/Self Care Barriers to Discharge: Continued Medical Work up   Patient Goals and CMS Choice Patient states their goals for this hospitalization and ongoing recovery are:: to continue to feel better CMS Medicare.gov Compare Post Acute Care list provided to:: (n/a) Choice offered to / list presented to : Patient  Expected Discharge Plan and Services Expected Discharge Plan: Home/Self Care In-house Referral: Clinical Social Work Discharge Planning Services: CM Consult, Follow-up appt scheduled Post Acute Care Choice: NA  Prior Living Arrangements/Services   Lives with:: Self Patient language and need for interpreter reviewed:: Yes(no needs) Do you feel safe going back to the place where you live?: Yes      Need for Family Participation in Patient Care: Yes (Comment)(assistance and supervision as needed) Care giver support system in place?: Yes (comment)(pt adult daughter and granddaughter) Current home services: DME Criminal Activity/Legal Involvement Pertinent to Current Situation/Hospitalization: No - Comment as  needed  Activities of Daily Living Home Assistive Devices/Equipment: None ADL Screening (condition at time of admission) Patient's cognitive ability adequate to safely complete daily activities?: Yes Is the patient deaf or have difficulty hearing?: No Does the patient have difficulty seeing, even when wearing glasses/contacts?: No Does the patient have difficulty concentrating, remembering, or making decisions?: No Patient able to express need for assistance with ADLs?: No Does the patient have difficulty dressing or bathing?: No Independently performs ADLs?: Yes (appropriate for developmental age) Does the patient have difficulty walking or climbing stairs?: Yes Weakness of Legs: Both Weakness of Arms/Hands: None  Permission Sought/Granted Permission sought to share information with : PCP Permission granted to share information with : Yes, Verbal Permission Granted     Permission granted to share info w AGENCY: PCP Office    Emotional Assessment Appearance:: Appears stated age Attitude/Demeanor/Rapport: Engaged, Gracious Affect (typically observed): Accepting, Adaptable, Appropriate Orientation: : Oriented to Self, Oriented to Place, Oriented to  Time, Oriented to Situation Alcohol / Substance Use: Not Applicable Psych Involvement: No (comment)  Admission diagnosis:  Community acquired pneumonia [J18.9] Community acquired pneumonia of left upper lobe of lung [J18.9] Patient Active Problem List   Diagnosis Date Noted  . Chronic respiratory failure with hypoxia ([place]) 07/21/2019  . Community acquired pneumonia 07/20/2019  . Cough 12/16/2018  . Pulmonary fibrosis ([place]) 04/06/2018  . ILD (interstitial lung disease) ([place]) 04/06/2018  . Gastroesophageal reflux disease 04/06/2018  . Osteoporosis 04/06/2018   PCP:  Marrian Salvage, [name] Pharmacy:   [facility] [place]  [place] [address] Phone: [phone] Fax: [phone]  [facility] [place], [place] [address] Phone: [phone] Fax: [phone]    Readmission Risk Interventions Readmission Risk Prevention Plan 07/21/2019  Post Dischage Appt Complete  Medication Screening Complete  Transportation Screening Complete  Some recent data might be hidden

## 2019-07-21 NOTE — Progress Notes (Signed)
Progress Note    Stacy [name]  CCE:[id number] DOB: 12-28-1955  DOA: 07/20/2019 PCP: Marrian Salvage, [name]    Brief Narrative:     Medical records reviewed and are as summarized below:  Stacy [name] is an 64 y.o. female with past medical history significant for pulmonary fibrosis, chronic respiratory failure for which patient has been on 4-5 L of supplemental oxygen at home and moderate pulmonary hypertension.  Patient presents with progressive dyspnea/SOB and weakness over the last 3 to 4 weeks that have gotten worse over the last 3 to 4 days.  Patient has been needing 6 L of supplemental oxygen at home.  There is associated fever, cough that is productive of beige phlegms.  Assessment/Plan:   Active Problems:   Pulmonary fibrosis (HCC)   Community acquired pneumonia   Chronic respiratory failure with hypoxia (HCC)   Acute on chronic respiratory failure/pulmonary fibrosis with worsening symptoms due to CA pneumonia: -wears 4-5L O2 at home -IV abx rocephin and azithromycin -Nebs DuoNeb -Continue steroids, but increase dose and taper as able -Rule out aspiration component to the pneumonic process-- has been seen by SLP with no swallowing issues -Further management depend on hospital course- thus far appears to be improving but not back to baseline -needs to [place] with Dr. Vaughan Browner, pulmonary  Elevated D-dimer: -CTA of the chest is negative for large central pulmonary embolism.  Anemia -unclear but appears to be microcytic -iron, TIBC, ferritin   Family Communication/Anticipated D/C date and plan/Code Status   DVT prophylaxis: Lovenox ordered. Code Status: Full Code.  Family Communication: Patient Disposition Plan: Pending continued improvement of symptoms and ability to wean down to 4 L nasal cannula with exertion, suspect in the next 24 to 48 hours   Medical Consultants:    None.     Subjective:   Able to take a deep breath without  discomfort today  Objective:    Vitals:   07/21/19 0245 07/21/19 0340 07/21/19 0508 07/21/19 0752  BP: 124/80 116/71 101/70   Pulse: 75 86 80   Resp: (!) 29  20   Temp:  100.1 F (37.8 C) 98.7 F (37.1 C)   TempSrc:  Oral Oral   SpO2: 96% 94% 99% 96%    Intake/Output Summary (Last 24 hours) at 07/21/2019 3654 Last data filed at 07/21/2019 0600 Gross per 24 hour  Intake 100 ml  Output 0 ml  Net 100 ml   There were no vitals filed for this visit.  Exam: In bed, on 5 L nasal cannula at rest No wheezing, minimal increased work of breathing, crackles at bases Regular rate and rhythm Alert and oriented x3 Pleasant and cooperative  Data Reviewed:   I have personally reviewed following labs and imaging studies:  Labs: Labs show the following:   Basic Metabolic Panel: Recent Labs  Lab 07/20/19 1620 07/21/19 0535  NA 140 138  K 4.1 4.1  CL 101 102  CO2 28 27  GLUCOSE 128* 93  BUN 7* <5*  CREATININE 0.74 0.72  CALCIUM 9.3 8.5*  MG  --  1.8  PHOS  --  3.3   GFR CrCl cannot be calculated (Unknown ideal weight.). Liver Function Tests: No results for input(s): AST, ALT, ALKPHOS, BILITOT, PROT, ALBUMIN in the last 168 hours. No results for input(s): LIPASE, AMYLASE in the last 168 hours. No results for input(s): AMMONIA in the last 168 hours. Coagulation profile No results for input(s): INR, PROTIME in the last 168 hours.  CBC: Recent Labs  Lab 07/20/19 1103 07/20/19 1620 07/21/19 0535  WBC 20.6 Repeated and verified X2.* 19.0* 16.9*  NEUTROABS 16.8* 15.8*  --   HGB 11.8* 12.1 10.7*  HCT 39.3 39.6 34.2*  MCV 76.8* 77.3* 74.7*  PLT 691.0* 641* 535*   Cardiac Enzymes: No results for input(s): CKTOTAL, CKMB, CKMBINDEX, TROPONINI in the last 168 hours. BNP (last 3 results) Recent Labs    01/28/19 1602  PROBNP 74.0   CBG: No results for input(s): GLUCAP in the last 168 hours. D-Dimer: Recent Labs    07/20/19 1103  DDIMER 3.76*   Hgb A1c: No  results for input(s): HGBA1C in the last 72 hours. Lipid Profile: No results for input(s): CHOL, HDL, LDLCALC, TRIG, CHOLHDL, LDLDIRECT in the last 72 hours. Thyroid function studies: No results for input(s): TSH, T4TOTAL, T3FREE, THYROIDAB in the last 72 hours.  Invalid input(s): FREET3 Anemia work up: No results for input(s): VITAMINB12, FOLATE, FERRITIN, TIBC, IRON, RETICCTPCT in the last 72 hours. Sepsis Labs: Recent Labs  Lab 07/20/19 1103 07/20/19 1620 07/21/19 0535  PROCALCITON  --   --  0.16  WBC 20.6 Repeated and verified X2.* 19.0* 16.9*    Microbiology Recent Results (from the past 240 hour(s))  Respiratory Panel by RT PCR (Flu A&B, Covid) - Nasopharyngeal Swab     Status: None   Collection Time: 07/20/19  7:38 PM   Specimen: Nasopharyngeal Swab  Result Value Ref Range Status   SARS Coronavirus 2 by RT PCR NEGATIVE NEGATIVE Final    Comment: (NOTE) SARS-CoV-2 target nucleic acids are NOT DETECTED. The SARS-CoV-2 RNA is generally detectable in upper respiratoy specimens during the acute phase of infection. The lowest concentration of SARS-CoV-2 viral copies this assay can detect is 131 copies/mL. A negative result does not preclude SARS-Cov-2 infection and should not be used as the sole basis for treatment or other patient management decisions. A negative result may occur with  improper specimen collection/handling, submission of specimen other than nasopharyngeal swab, presence of viral mutation(s) within the areas targeted by this assay, and inadequate number of viral copies (<131 copies/mL). A negative result must be combined with clinical observations, patient history, and epidemiological information. The expected result is Negative. Fact Sheet for Patients:  PinkCheek.be Fact Sheet for Healthcare Providers:  GravelBags.it This test is not yet ap proved or cleared by the [place] FDA and  has been  authorized for detection and/or diagnosis of SARS-CoV-2 by FDA under an Emergency Use Authorization (EUA). This EUA will remain  in effect (meaning this test can be used) for the duration of the COVID-19 declaration under Section 564(b)(1) of the Act, 21 U.S.C. section 360bbb-3(b)(1), unless the authorization is terminated or revoked sooner.    Influenza A by PCR NEGATIVE NEGATIVE Final   Influenza B by PCR NEGATIVE NEGATIVE Final    Comment: (NOTE) The Xpert Xpress SARS-CoV-2/FLU/RSV assay is intended as an aid in  the diagnosis of influenza from Nasopharyngeal swab specimens and  should not be used as a sole basis for treatment. Nasal washings and  aspirates are unacceptable for Xpert Xpress SARS-CoV-2/FLU/RSV  testing. Fact Sheet for Patients: PinkCheek.be Fact Sheet for Healthcare Providers: GravelBags.it This test is not yet approved or cleared by the [place] FDA and  has been authorized for detection and/or diagnosis of SARS-CoV-2 by  FDA under an Emergency Use Authorization (EUA). This EUA will remain  in effect (meaning this test can be used) for the duration of the  Covid-19  declaration under Section 564(b)(1) of the Act, 21  U.S.C. section 360bbb-3(b)(1), unless the authorization is  terminated or revoked. Performed at [facility], [place] [address], [place], [place] [address]     Procedures and diagnostic studies:  Chest xray  Result Date: 07/20/2019 CLINICAL DATA:  Cough.  History of pulmonary fibrosis EXAM: CHEST - 2 VIEW COMPARISON:  Chest CT Nov 23, 2018 FINDINGS: There is extensive underlying pulmonary fibrosis. There is an area of increased opacity in the posterior segment of the left upper lobe measuring 3.8 x 2.9 x 2.8 cm. There is consolidation in portions of the right upper lobe and right base. Heart size and pulmonary vascularity are normal. No adenopathy evident. No bone lesions. IMPRESSION:  1. Small irregular nodular appearing opacity in the posterior segment of the left upper lobe measuring 3.8 x 2.9 x 2.8 cm. Focal neoplasm must be of concern given this appearance. Advise contrast enhanced chest CT to further assess. 2. Extensive fibrotic change bilaterally, grossly stable from prior study. 3. Areas of consolidation concerning for pneumonia in portions of the right upper lobe and right base. 4.  Cardiac silhouette normal.  No evident adenopathy. These results will be called to the ordering clinician or representative by the Radiologist Assistant, and communication documented in the PACS or zVision Dashboard. Electronically Signed   By: Lowella Grip III M.D.   On: 07/20/2019 11:24   CT Angio Chest PE W and/or Wo Contrast  Result Date: 07/20/2019 CLINICAL DATA:  64 year old female with shortness of breath and fever. Positive D-dimer. Concern for pulmonary embolism. EXAM: CT ANGIOGRAPHY CHEST WITH CONTRAST TECHNIQUE: Multidetector CT imaging of the chest was performed using the standard protocol during bolus administration of intravenous contrast. Multiplanar CT image reconstructions and MIPs were obtained to evaluate the vascular anatomy. CONTRAST:  173m OMNIPAQUE IOHEXOL 350 MG/ML SOLN COMPARISON:  Chest CT dated 11/23/2018. FINDINGS: Cardiovascular: There is mild cardiomegaly. There is retrograde flow of contrast from the right atrium into the IVC in keeping with right heart dysfunction. No pericardial effusion. Mild atherosclerotic calcification of the thoracic aorta. Mildly dilated main pulmonary trunk suggestive of a degree of pulmonary hypertension. Clinical correlation is recommended. Evaluation of the pulmonary arteries is somewhat limited due to respiratory motion artifact and suboptimal visualization of the peripheral branches. No large central pulmonary artery embolus identified. Mediastinum/Nodes: Bilateral hilar adenopathy measure up to 15 mm in short axis on the left. The  esophagus is grossly unremarkable. Asymmetric enlargement of the right thyroid lobe, likely related to underlying nodule. Ultrasound may provide better evaluation. No mediastinal fluid collection. Lungs/Pleura: Background of interstitial lung disease with subpleural reticulation and honeycombing. There is a large area of consolidative change in the left upper lobe with air bronchogram most consistent with pneumonia. Clinical correlation and follow-up to resolution recommended to exclude an underlying mass. There is no pleural effusion or pneumothorax. The central airways are patent. Upper Abdomen: Partially visualized 5.5 cm cyst in the interpolar aspect of the right kidney. Musculoskeletal: No chest wall abnormality. No acute or significant osseous findings. Review of the MIP images confirms the above findings. IMPRESSION: 1. Left upper lobe pneumonia. Clinical correlation and follow-up to resolution recommended. 2. No large central pulmonary artery embolus. 3. Mild cardiomegaly with findings of right heart dysfunction. Correlation with echocardiogram recommended. 4. Changes of pulmonary fibrosis. 5. Bilateral hilar adenopathy, reactive. Electronically Signed   By: AAnner CreteM.D.   On: 07/20/2019 18:33    Medications:   . azaTHIOprine  50 mg Oral TID  . budesonide (PULMICORT) nebulizer solution  0.25 mg Nebulization BID  . enoxaparin (LOVENOX) injection  40 mg Subcutaneous Q24H  . ipratropium-albuterol  3 mL Nebulization Q6H  . pantoprazole  40 mg Oral Daily  . Pirfenidone  3 tablet Oral TID  . predniSONE  60 mg Oral Q breakfast  . simvastatin  20 mg Oral q1800  . sulfamethoxazole-trimethoprim  1 tablet Oral Once per day on Mon Wed Fri   Continuous Infusions: . azithromycin    . cefTRIAXone (ROCEPHIN)  IV       LOS: 1 day   Geradine Girt  [facility]   How to contact the [facility] Attending or Consulting provider [place] or covering provider during after hours [place], for this  patient?  1. Check the care team in [facility] and look for a) attending/consulting [facility] provider listed and b) the [facility] team listed 2. Log into [email or web address] and use [place]'s universal password to access. If you do not have the password, please contact the hospital operator. 3. Locate the [facility] provider you are looking for under [facility] and page to a number that you can be directly reached. 4. If you still have difficulty reaching the provider, please page the [facility] (Director on Call) for the Hospitalists listed on amion for assistance.  07/21/2019, 9:06 AM

## 2019-07-21 NOTE — Evaluation (Signed)
Clinical/Bedside Swallow Evaluation Patient Details  Name: Nialah Saravia MRN: [id number] Date of Birth: June 24, 1956  Today's Date: 07/21/2019 Time: SLP Start Time (ACUTE ONLY): 0825 SLP Stop Time (ACUTE ONLY): 0840 SLP Time Calculation (min) (ACUTE ONLY): 15 min  Past Medical History:  Past Medical History:  Diagnosis Date  . GERD (gastroesophageal reflux disease)   . Pneumonia   . Pulmonary fibrosis ([place])    Past Surgical History:  Past Surgical History:  Procedure Laterality Date  . ANKLE SURGERY Left    fx, has plates  . COLONOSCOPY    . ESOPHAGOGASTRODUODENOSCOPY    . HEMORRHOID SURGERY    . LUNG BIOPSY    . TUBAL LIGATION     HPI:  Patient is a 64 year old African-American female with past medical history significant for pulmonary fibrosis, chronic respiratory failure for which patient has been on 4-5 L of supplemental oxygen at home and moderate pulmonary hypertension.  Patient presents with progressive dyspnea/SOB and weakness over the last 3 to 4 weeks that have gotten worse over the last 3 to 4 days.  MD dx acute on chronic respiratory failure/pulmonary fibrosis with worsening symptoms and possible underlying pna. MD requests "Rule out aspiration component to the pneumonic process"   Assessment / Plan / Recommendation Clinical Impression  Pt does not show signs of dysphagia under observation. She is very short of breath with activity (standing, walking), but can tolerate consecutive drinking without difficulty. She denies any observations of choking and denies fatigue with meals. She does report night time reflux and sleeps on two pillows. GERD and LPR can increase risk of aspiration of gastric contents and would be the greatest risk factor in this situation. We discussed increasing precautions by sleeping on a wedge and avoiding po intake without 3-4 hours of bedtime. No diet modification needed. Will resume regular texture and thin liquids.  SLP Visit Diagnosis: Dysphagia,  unspecified (R13.10)    Aspiration Risk  Mild aspiration risk    Diet Recommendation Regular;Thin liquid   Liquid Administration via: Cup;Straw Medication Administration: Whole meds with liquid Supervision: Patient able to self feed Postural Changes: Seated upright at 90 degrees;Remain upright for at least 30 minutes after po intake    Other  Recommendations     Follow up Recommendations None      Frequency and Duration            Prognosis        Swallow Study   General HPI: Patient is a 64 year old African-American female with past medical history significant for pulmonary fibrosis, chronic respiratory failure for which patient has been on 4-5 L of supplemental oxygen at home and moderate pulmonary hypertension.  Patient presents with progressive dyspnea/SOB and weakness over the last 3 to 4 weeks that have gotten worse over the last 3 to 4 days.  MD dx acute on chronic respiratory failure/pulmonary fibrosis with worsening symptoms and possible underlying pna. MD requests "Rule out aspiration component to the pneumonic process" Type of Study: Bedside Swallow Evaluation Diet Prior to this Study: NPO Temperature Spikes Noted: No Respiratory Status: Nasal cannula History of Recent Intubation: No Behavior/Cognition: Alert;Cooperative;Pleasant mood Oral Cavity Assessment: Within Functional Limits Oral Care Completed by SLP: No Oral Cavity - Dentition: Adequate natural dentition Vision: Functional for self-feeding Self-Feeding Abilities: Able to feed self Patient Positioning: Upright in bed Baseline Vocal Quality: Breathy Volitional Cough: Strong Volitional Swallow: Able to elicit    Oral/Motor/Sensory Function Overall Oral Motor/Sensory Function: Within functional limits   Ice Chips Ice  chips: Not tested   Thin Liquid Thin Liquid: Within functional limits    Nectar Thick Nectar Thick Liquid: Not tested   Honey Thick Honey Thick Liquid: Not tested   Puree Puree: Not  tested   Solid     Solid: Not tested     Herbie Baltimore, MA CCC-SLP  Acute Rehabilitation Services Pager [phone] Office [phone]  Lynann Beaver 07/21/2019,10:46 AM

## 2019-07-21 NOTE — Plan of Care (Signed)
  Problem: Education: Goal: Knowledge of General Education information will improve Description: Including pain rating scale, medication(s)/side effects and non-pharmacologic comfort measures Outcome: Progressing

## 2019-07-21 NOTE — Progress Notes (Signed)
New Admission Note: ? Arrival Method: Stretcher Mental Orientation: Alert and Oriented x4 Telemetry: Box 11 Assessment: Completed Skin: Refer to flowsheet IV: Left Antecubital  Pain:7/10 Tubes: None  Safety Measures: Safety Fall Prevention Plan discussed with patient. Admission: Completed [facility] [place] Orientation: Patient has been orientated to the room, unit and the staff. Family: None at the bedside Orders have been reviewed and are being implemented. Will continue to monitor the patient. Call light has been placed within reach and bed alarm has been activated.  ? Milagros Loll, RN

## 2019-07-21 NOTE — ED Notes (Addendum)
Pt temp 102.7 Dr. Conni Elliot notified. He gave orders for 650 of Tylenol which was given he stated that it was due to her Pnuemonia

## 2019-07-21 NOTE — Plan of Care (Signed)
  Problem: Activity: Goal: Risk for activity intolerance will decrease Outcome: Progressing

## 2019-07-22 ENCOUNTER — Telehealth: Payer: Self-pay | Admitting: Pulmonary Disease

## 2019-07-22 ENCOUNTER — Other Ambulatory Visit: Payer: Self-pay | Admitting: Pulmonary Disease

## 2019-07-22 ENCOUNTER — Other Ambulatory Visit: Payer: Self-pay | Admitting: Pharmacist

## 2019-07-22 DIAGNOSIS — J8410 Pulmonary fibrosis, unspecified: Secondary | ICD-10-CM

## 2019-07-22 DIAGNOSIS — Z5181 Encounter for therapeutic drug level monitoring: Secondary | ICD-10-CM

## 2019-07-22 DIAGNOSIS — J84112 Idiopathic pulmonary fibrosis: Secondary | ICD-10-CM

## 2019-07-22 LAB — CBC
HCT: 33.1 % — ABNORMAL LOW (ref 36.0–46.0)
Hemoglobin: 10.1 g/dL — ABNORMAL LOW (ref 12.0–15.0)
MCH: 23.4 pg — ABNORMAL LOW (ref 26.0–34.0)
MCHC: 30.5 g/dL (ref 30.0–36.0)
MCV: 76.8 fL — ABNORMAL LOW (ref 80.0–100.0)
Platelets: 524 10*3/uL — ABNORMAL HIGH (ref 150–400)
RBC: 4.31 MIL/uL (ref 3.87–5.11)
RDW: 14.6 % (ref 11.5–15.5)
WBC: 15 10*3/uL — ABNORMAL HIGH (ref 4.0–10.5)
nRBC: 0 % (ref 0.0–0.2)

## 2019-07-22 LAB — BASIC METABOLIC PANEL
Anion gap: 9 (ref 5–15)
BUN: 6 mg/dL — ABNORMAL LOW (ref 8–23)
CO2: 28 mmol/L (ref 22–32)
Calcium: 8.7 mg/dL — ABNORMAL LOW (ref 8.9–10.3)
Chloride: 105 mmol/L (ref 98–111)
Creatinine, Ser: 0.7 mg/dL (ref 0.44–1.00)
GFR calc Af Amer: >60 mL/min (ref >60)
GFR calc non Af Amer: >60 mL/min (ref >60)
Glucose, Bld: 148 mg/dL — ABNORMAL HIGH (ref 70–99)
Potassium: 4.3 mmol/L (ref 3.5–5.1)
Sodium: 142 mmol/L (ref 135–145)

## 2019-07-22 LAB — IRON AND TIBC
Iron: 28 ug/dL (ref 28–170)
Saturation Ratios: 11 % (ref 10.4–31.8)
TIBC: 251 ug/dL (ref 250–450)
UIBC: 223 ug/dL

## 2019-07-22 LAB — FERRITIN: Ferritin: 187 ng/mL (ref 11–307)

## 2019-07-22 MED ORDER — MAGNESIUM SULFATE 2 GM/50ML IV SOLN
2.0000 g | Freq: Once | INTRAVENOUS | Status: AC
  Administered 2019-07-22: 2 g via INTRAVENOUS
  Filled 2019-07-22: qty 50

## 2019-07-22 MED ORDER — ACETAMINOPHEN 325 MG PO TABS
650.0000 mg | ORAL_TABLET | Freq: Four times a day (QID) | ORAL | Status: DC | PRN
  Administered 2019-07-22 – 2019-07-25 (×7): 650 mg via ORAL
  Filled 2019-07-22 (×7): qty 2

## 2019-07-22 NOTE — Progress Notes (Signed)
Patient is due for hepatic panel for [place] monitoring.

## 2019-07-22 NOTE — Progress Notes (Signed)
Progress Note    Stacy [name]  ACZ:[id number] DOB: [birth year]  DOA: 07/20/2019 PCP: Marrian Salvage, [name]    Brief Narrative:     Medical records reviewed and are as summarized below:  Stacy [name] is an 64 y.o. female with past medical history significant for pulmonary fibrosis, chronic respiratory failure for which patient has been on 4-5 L of supplemental oxygen at home and moderate pulmonary hypertension.  Patient presents with progressive dyspnea/SOB and weakness over the last 3 to 4 weeks that have gotten worse over the last 3 to 4 days.  Patient has been needing 6 L of supplemental oxygen at home.  There is associated fever, cough that is productive of beige phlegms.  Assessment/Plan:   Active Problems:   Pulmonary fibrosis (HCC)   Community acquired pneumonia   Chronic respiratory failure with hypoxia (HCC)   Acute on chronic respiratory failure/pulmonary fibrosis with worsening symptoms due to CA pneumonia: -wears 4-5L O2 at home -IV abx rocephin and azithromycin -Nebs/DuoNeb -Continue steroids, but increase dose and taper as able -Rule'd out aspiration component to the pneumonic process-- has been seen by SLP with no swallowing issues -Further management depend on hospital course- thus far appears to be improving but not back to baseline -needs to [place] with Dr. Vaughan Browner, pulmonary-- if not improved tomorrow, may need pulm consult  Elevated D-dimer: -CTA of the chest is negative for large central pulmonary embolism.  Anemia -unclear but appears to be microcytic -iron, TIBC, ferritin wnl   Family Communication/Anticipated D/C date and plan/Code Status   DVT prophylaxis: Lovenox ordered. Code Status: Full Code.  Family Communication: Patient Disposition Plan: Pending continued improvement of symptoms and ability to wean down to 4 L nasal cannula with exertion, suspect in the next 24 to 48 hours   Medical Consultants:     None.     Subjective:   Still very short of breath with movement-- even in bed  Objective:    Vitals:   07/22/19 0326 07/22/19 0513 07/22/19 0752 07/22/19 1154  BP:  98/64  118/64  Pulse: 75 84 75 88  Resp: _0 Temp:  98.2 F (36.8 C)  98 F (36.7 C)  TempSrc:  Oral  Oral  SpO2: 90% 97% 91% 96%  Weight:  66.1 kg    Height:        Intake/Output Summary (Last 24 hours) at 07/22/2019 1249 Last data filed at 07/21/2019 2234 Gross per 24 hour  Intake 250 ml  Output 700 ml  Net -450 ml   Filed Weights   07/21/19 1617 07/22/19 0513  Weight: 66 kg 66.1 kg    Exam: In bed, voice soft Crackles at bases but no wheezing rrr No LE edema A+Ox3  Data Reviewed:   I have personally reviewed following labs and imaging studies:  Labs: Labs show the following:   Basic Metabolic Panel: Recent Labs  Lab 07/20/19 1620 07/20/19 1620 07/21/19 0535 07/22/19 0226  NA 140  --  138 142  K 4.1   < > 4.1 4.3  CL 101  --  102 105  CO2 28  --  27 28  GLUCOSE 128*  --  93 148*  BUN 7*  --  <5* 6*  CREATININE 0.74  --  0.72 0.70  CALCIUM 9.3  --  8.5* 8.7*  MG  --   --  1.8  --   PHOS  --   --  3.3  --    < > =  values in this interval not displayed.   GFR Estimated Creatinine Clearance: 65.6 mL/min (by C-G formula based on SCr of 0.7 mg/dL). Liver Function Tests: No results for input(s): AST, ALT, ALKPHOS, BILITOT, PROT, ALBUMIN in the last 168 hours. No results for input(s): LIPASE, AMYLASE in the last 168 hours. No results for input(s): AMMONIA in the last 168 hours. Coagulation profile No results for input(s): INR, PROTIME in the last 168 hours.  CBC: Recent Labs  Lab 07/20/19 1103 07/20/19 1620 07/21/19 0535 07/22/19 0226  WBC 20.6 Repeated and verified X2.* 19.0* 16.9* 15.0*  NEUTROABS 16.8* 15.8*  --   --   HGB 11.8* 12.1 10.7* 10.1*  HCT 39.3 39.6 34.2* 33.1*  MCV 76.8* 77.3* 74.7* 76.8*  PLT 691.0* 641* 535* 524*   Cardiac Enzymes: No  results for input(s): CKTOTAL, CKMB, CKMBINDEX, TROPONINI in the last 168 hours. BNP (last 3 results) Recent Labs    01/28/19 1602  PROBNP 74.0   CBG: No results for input(s): GLUCAP in the last 168 hours. D-Dimer: Recent Labs    07/20/19 1103  DDIMER 3.76*   Hgb A1c: No results for input(s): HGBA1C in the last 72 hours. Lipid Profile: No results for input(s): CHOL, HDL, LDLCALC, TRIG, CHOLHDL, LDLDIRECT in the last 72 hours. Thyroid function studies: No results for input(s): TSH, T4TOTAL, T3FREE, THYROIDAB in the last 72 hours.  Invalid input(s): FREET3 Anemia work up: Recent Labs    07/22/19 0226  FERRITIN 187  TIBC 251  IRON 28   Sepsis Labs: Recent Labs  Lab 07/20/19 1103 07/20/19 1620 07/21/19 0535 07/22/19 0226  PROCALCITON  --   --  0.16  --   WBC 20.6 Repeated and verified X2.* 19.0* 16.9* 15.0*    Microbiology Recent Results (from the past 240 hour(s))  Respiratory Panel by RT PCR (Flu A&B, Covid) - Nasopharyngeal Swab     Status: None   Collection Time: 07/20/19  7:38 PM   Specimen: Nasopharyngeal Swab  Result Value Ref Range Status   SARS Coronavirus 2 by RT PCR NEGATIVE NEGATIVE Final    Comment: (NOTE) SARS-CoV-2 target nucleic acids are NOT DETECTED. The SARS-CoV-2 RNA is generally detectable in upper respiratoy specimens during the acute phase of infection. The lowest concentration of SARS-CoV-2 viral copies this assay can detect is 131 copies/mL. A negative result does not preclude SARS-Cov-2 infection and should not be used as the sole basis for treatment or other patient management decisions. A negative result may occur with  improper specimen collection/handling, submission of specimen other than nasopharyngeal swab, presence of viral mutation(s) within the areas targeted by this assay, and inadequate number of viral copies (<131 copies/mL). A negative result must be combined with clinical observations, patient history, and  epidemiological information. The expected result is Negative. Fact Sheet for Patients:  PinkCheek.be Fact Sheet for Healthcare Providers:  GravelBags.it This test is not yet ap proved or cleared by the [place] FDA and  has been authorized for detection and/or diagnosis of SARS-CoV-2 by FDA under an Emergency Use Authorization (EUA). This EUA will remain  in effect (meaning this test can be used) for the duration of the COVID-19 declaration under Section 564(b)(1) of the Act, 21 U.S.C. section 360bbb-3(b)(1), unless the authorization is terminated or revoked sooner.    Influenza A by PCR NEGATIVE NEGATIVE Final   Influenza B by PCR NEGATIVE NEGATIVE Final    Comment: (NOTE) The Xpert Xpress SARS-CoV-2/FLU/RSV assay is intended as an aid in  the diagnosis  of influenza from Nasopharyngeal swab specimens and  should not be used as a sole basis for treatment. Nasal washings and  aspirates are unacceptable for Xpert Xpress SARS-CoV-2/FLU/RSV  testing. Fact Sheet for Patients: PinkCheek.be Fact Sheet for Healthcare Providers: GravelBags.it This test is not yet approved or cleared by the [place] FDA and  has been authorized for detection and/or diagnosis of SARS-CoV-2 by  FDA under an Emergency Use Authorization (EUA). This EUA will remain  in effect (meaning this test can be used) for the duration of the  Covid-19 declaration under Section 564(b)(1) of the Act, 21  U.S.C. section 360bbb-3(b)(1), unless the authorization is  terminated or revoked. Performed at [facility], [place] [address] [place], [place] [address]   MRSA PCR Screening     Status: None   Collection Time: 07/21/19  8:41 AM   Specimen: Nasopharyngeal  Result Value Ref Range Status   MRSA by PCR NEGATIVE NEGATIVE Final    Comment:        The GeneXpert MRSA Assay (FDA approved for NASAL  specimens only), is one component of a comprehensive MRSA colonization surveillance program. It is not intended to diagnose MRSA infection nor to guide or monitor treatment for MRSA infections. Performed at [facility], [place] [address] [place], [place] [address]     Procedures and diagnostic studies:  CT Angio Chest PE W and/or Wo Contrast  Result Date: 07/20/2019 CLINICAL DATA:  64 year old female with shortness of breath and fever. Positive D-dimer. Concern for pulmonary embolism. EXAM: CT ANGIOGRAPHY CHEST WITH CONTRAST TECHNIQUE: Multidetector CT imaging of the chest was performed using the standard protocol during bolus administration of intravenous contrast. Multiplanar CT image reconstructions and MIPs were obtained to evaluate the vascular anatomy. CONTRAST:  172m OMNIPAQUE IOHEXOL 350 MG/ML SOLN COMPARISON:  Chest CT dated 11/23/2018. FINDINGS: Cardiovascular: There is mild cardiomegaly. There is retrograde flow of contrast from the right atrium into the IVC in keeping with right heart dysfunction. No pericardial effusion. Mild atherosclerotic calcification of the thoracic aorta. Mildly dilated main pulmonary trunk suggestive of a degree of pulmonary hypertension. Clinical correlation is recommended. Evaluation of the pulmonary arteries is somewhat limited due to respiratory motion artifact and suboptimal visualization of the peripheral branches. No large central pulmonary artery embolus identified. Mediastinum/Nodes: Bilateral hilar adenopathy measure up to 15 mm in short axis on the left. The esophagus is grossly unremarkable. Asymmetric enlargement of the right thyroid lobe, likely related to underlying nodule. Ultrasound may provide better evaluation. No mediastinal fluid collection. Lungs/Pleura: Background of interstitial lung disease with subpleural reticulation and honeycombing. There is a large area of consolidative change in the left upper lobe with air bronchogram most  consistent with pneumonia. Clinical correlation and follow-up to resolution recommended to exclude an underlying mass. There is no pleural effusion or pneumothorax. The central airways are patent. Upper Abdomen: Partially visualized 5.5 cm cyst in the interpolar aspect of the right kidney. Musculoskeletal: No chest wall abnormality. No acute or significant osseous findings. Review of the MIP images confirms the above findings. IMPRESSION: 1. Left upper lobe pneumonia. Clinical correlation and follow-up to resolution recommended. 2. No large central pulmonary artery embolus. 3. Mild cardiomegaly with findings of right heart dysfunction. Correlation with echocardiogram recommended. 4. Changes of pulmonary fibrosis. 5. Bilateral hilar adenopathy, reactive. Electronically Signed   By: AAnner CreteM.D.   On: 07/20/2019 18:33    Medications:   . azaTHIOprine  50 mg Oral TID  . budesonide (  PULMICORT) nebulizer solution  0.25 mg Nebulization BID  . enoxaparin (LOVENOX) injection  40 mg Subcutaneous Q24H  . ipratropium-albuterol  3 mL Nebulization Q6H  . pantoprazole  40 mg Oral Daily  . Pirfenidone  3 tablet Oral TID  . predniSONE  60 mg Oral Q breakfast  . simvastatin  20 mg Oral q1800  . sulfamethoxazole-trimethoprim  1 tablet Oral Once per day on Mon Wed Fri   Continuous Infusions: . azithromycin 500 mg (07/21/19 1816)  . cefTRIAXone (ROCEPHIN)  IV 1 g (07/21/19 1558)     LOS: 2 days   Geradine Girt  [facility]   How to contact the [facility] Attending or Consulting provider [place] or covering provider during after hours [place], for this patient?  1. Check the care team in [facility] and look for a) attending/consulting TR[facility] provider listed and b) the [facility] team listed 2. Log into [email or web address] and use [place]'s universal password to access. If you do not have the password, please contact the hospital operator. 3. Locate the [facility] provider you are looking for under [facility] and page  to a number that you can be directly reached. 4. If you still have difficulty reaching the provider, please page the [facility] (Director on Call) for the Hospitalists listed on amion for assistance.  07/22/2019, 12:49 PM

## 2019-07-22 NOTE — Telephone Encounter (Signed)
Called and spoke with pt's daughter Angela Nevin letting her know that it the staff at the hospital that is caring for pt wants a pulmonary consult to be done, then our doctors will go see pt while in the hospital. Angela Nevin verbalized understanding and stated that she would try to call the hospital to see if they can have a pulmonary consult be done so we could then round on pt. Nothing further needed.

## 2019-07-23 ENCOUNTER — Telehealth: Payer: Self-pay | Admitting: Internal Medicine

## 2019-07-23 DIAGNOSIS — J9601 Acute respiratory failure with hypoxia: Secondary | ICD-10-CM

## 2019-07-23 DIAGNOSIS — D849 Immunodeficiency, unspecified: Secondary | ICD-10-CM

## 2019-07-23 LAB — COMPREHENSIVE METABOLIC PANEL
ALT: 12 U/L (ref 0–44)
AST: 16 U/L (ref 15–41)
Albumin: 2.3 g/dL — ABNORMAL LOW (ref 3.5–5.0)
Alkaline Phosphatase: 41 U/L (ref 38–126)
Anion gap: 9 (ref 5–15)
BUN: <5 mg/dL — ABNORMAL LOW (ref 8–23)
CO2: 27 mmol/L (ref 22–32)
Calcium: 8.3 mg/dL — ABNORMAL LOW (ref 8.9–10.3)
Chloride: 106 mmol/L (ref 98–111)
Creatinine, Ser: 0.62 mg/dL (ref 0.44–1.00)
GFR calc Af Amer: >60 mL/min (ref >60)
GFR calc non Af Amer: >60 mL/min (ref >60)
Glucose, Bld: 151 mg/dL — ABNORMAL HIGH (ref 70–99)
Potassium: 4.6 mmol/L (ref 3.5–5.1)
Sodium: 142 mmol/L (ref 135–145)
Total Bilirubin: 0.2 mg/dL — ABNORMAL LOW (ref 0.3–1.2)
Total Protein: 5.9 g/dL — ABNORMAL LOW (ref 6.5–8.1)

## 2019-07-23 LAB — CBC
HCT: 30.9 % — ABNORMAL LOW (ref 36.0–46.0)
Hemoglobin: 9.3 g/dL — ABNORMAL LOW (ref 12.0–15.0)
MCH: 23.2 pg — ABNORMAL LOW (ref 26.0–34.0)
MCHC: 30.1 g/dL (ref 30.0–36.0)
MCV: 77.1 fL — ABNORMAL LOW (ref 80.0–100.0)
Platelets: 502 10*3/uL — ABNORMAL HIGH (ref 150–400)
RBC: 4.01 MIL/uL (ref 3.87–5.11)
RDW: 14.3 % (ref 11.5–15.5)
WBC: 12.3 10*3/uL — ABNORMAL HIGH (ref 4.0–10.5)
nRBC: 0 % (ref 0.0–0.2)

## 2019-07-23 MED ORDER — DM-GUAIFENESIN ER 30-600 MG PO TB12
1.0000 | ORAL_TABLET | Freq: Two times a day (BID) | ORAL | Status: DC
  Administered 2019-07-23 – 2019-07-25 (×5): 1 via ORAL
  Filled 2019-07-23 (×5): qty 1

## 2019-07-23 MED ORDER — HYDROCOD POLST-CPM POLST ER 10-8 MG/5ML PO SUER
5.0000 mL | Freq: Two times a day (BID) | ORAL | Status: DC
  Administered 2019-07-23 – 2019-07-25 (×5): 5 mL via ORAL
  Filled 2019-07-23 (×5): qty 5

## 2019-07-23 NOTE — Progress Notes (Signed)
PROGRESS NOTE    Stacy [name]   AYG:[id number]  DOB: [birth year]  DOA: 07/20/2019 PCP: Marrian Salvage, [name]   Brief Narrative:  Stacy [name]   is a 64 year old woman with a history of biopsy-proven UIP on chronic prednisone, Imuran, esbriet followed by Dr. Vaughan Browner who is presenting with 1 month of worsening cough and shortness of breath on exertion.  She is normally on 4 L of oxygen at home but has recently needed to go to 6 L.  This is culminated in an observation admission.  Pulmonology is consulted to assist with her work-up.  Her CT scan is notable for a dense left upper lobe infiltrate.  Her procalcitonin is interestingly negative.  She does have chills but denies any fevers.  Sputum initially beige in color now more clear.  She has mild pleurisy which is chronic for her.  50 pack year smoking history.   Subjective: She is coughing up quite a lot of sputum today and asking for cough medicine.  Dyspnea is a little better today.    Assessment & Plan:   Active Problems:   Pulmonary fibrosis ([place])   Community acquired pneumonia- left upper lobe   Chronic respiratory failure with hypoxia (HCC) -Covid & flu negative -Continue IV antibiotics -Pulmonary eval completed today-sputum culture ordered by pulmonary -Continue Imuran, Pulmicort, duo nebs, prednisone and Pirfenidone -Have added Tussionex and Mucinex DM  Time spent in minutes: 35 DVT prophylaxis: Lovenox Code Status: Full code Family Communication:  Disposition Plan: home Consultants:   pulm Procedures:   none Antimicrobials:  Anti-infectives (From admission, onward)   Start     Dose/Rate Route Frequency Ordered Stop   07/21/19 1800  azithromycin (ZITHROMAX) 500 mg in sodium chloride 0.9 % 250 mL IVPB     500 mg 250 mL/hr over 60 Minutes Intravenous Every 24 hours 07/21/19 0333     07/21/19 1600  cefTRIAXone (ROCEPHIN) 1 g in sodium chloride 0.9 % 100 mL IVPB     1 g 200 mL/hr over 30 Minutes Intravenous  Every 24 hours 07/21/19 0333     07/21/19 0900  sulfamethoxazole-trimethoprim (BACTRIM DS) 800-160 MG per tablet 1 tablet    Note to Pharmacy: TAKE 1 TABLET EVERY DAY ON MONDAY, [facility] AND FRIDAY     1 tablet Oral Once per day on Mon Wed Fri 07/21/19 0333     07/20/19 1715  cefTRIAXone (ROCEPHIN) 1 g in sodium chloride 0.9 % 100 mL IVPB     1 g 200 mL/hr over 30 Minutes Intravenous  Once 07/20/19 1706 07/20/19 1838   07/20/19 1715  azithromycin (ZITHROMAX) 500 mg in sodium chloride 0.9 % 250 mL IVPB     500 mg 250 mL/hr over 60 Minutes Intravenous  Once 07/20/19 1706 07/20/19 2109       Objective: Vitals:   07/22/19 1154 07/23/19 0031 07/23/19 0622 07/23/19 1238  BP: 118/64 110/69 119/77 116/73  Pulse: 88 87 87 96  Resp: _0 Temp: 98 F (36.7 C) 97.9 F (36.6 C) 98.1 F (36.7 C) 98.6 F (37 C)  TempSrc: Oral Oral Oral Oral  SpO2: 96% 97% 96% 97%  Weight:      Height:        Intake/Output Summary (Last 24 hours) at 07/23/2019 1624 Last data filed at 07/22/2019 1950 Gross per 24 hour  Intake 740.06 ml  Output -  Net 740.06 ml   Filed Weights   07/21/19 1617 07/22/19 0513  Weight: 66 kg 66.1  kg    Examination: General exam: Appears comfortable  HEENT: PERRLA, oral mucosa moist, no sclera icterus or thrush Respiratory system: crackles at bases Cardiovascular system: S1 & S2 heard, RRR.   Gastrointestinal system: Abdomen soft, non-tender, nondistended. Normal bowel sounds. Central nervous system: Alert and oriented. No focal neurological deficits. Extremities: No cyanosis, clubbing or edema Skin: No rashes or ulcers Psychiatry:  Mood & affect appropriate.     Data Reviewed: I have personally reviewed following labs and imaging studies  CBC: Recent Labs  Lab 07/20/19 1103 07/20/19 1620 07/21/19 0535 07/22/19 0226 07/23/19 0309  WBC 20.6 Repeated and verified X2.* 19.0* 16.9* 15.0* 12.3*  NEUTROABS 16.8* 15.8*  --   --   --   HGB 11.8* 12.1 10.7*  10.1* 9.3*  HCT 39.3 39.6 34.2* 33.1* 30.9*  MCV 76.8* 77.3* 74.7* 76.8* 77.1*  PLT 691.0* 641* 535* 524* 832*   Basic Metabolic Panel: Recent Labs  Lab 07/20/19 1620 07/21/19 0535 07/22/19 0226 07/23/19 0309  NA 140 138 142 142  K 4.1 4.1 4.3 4.6  CL 101 102 105 106  CO2 _0 GLUCOSE 128* 93 148* 151*  BUN 7* <5* 6* <5*  CREATININE 0.74 0.72 0.70 0.62  CALCIUM 9.3 8.5* 8.7* 8.3*  MG  --  1.8  --   --   PHOS  --  3.3  --   --    GFR: Estimated Creatinine Clearance: 65.6 mL/min (by C-G formula based on SCr of 0.62 mg/dL). Liver Function Tests: Recent Labs  Lab 07/23/19 0309  AST 16  ALT 12  ALKPHOS 41  BILITOT 0.2*  PROT 5.9*  ALBUMIN 2.3*   No results for input(s): LIPASE, AMYLASE in the last 168 hours. No results for input(s): AMMONIA in the last 168 hours. Coagulation Profile: No results for input(s): INR, PROTIME in the last 168 hours. Cardiac Enzymes: No results for input(s): CKTOTAL, CKMB, CKMBINDEX, TROPONINI in the last 168 hours. BNP (last 3 results) Recent Labs    01/28/19 1602  PROBNP 74.0   HbA1C: No results for input(s): HGBA1C in the last 72 hours. CBG: No results for input(s): GLUCAP in the last 168 hours. Lipid Profile: No results for input(s): CHOL, HDL, LDLCALC, TRIG, CHOLHDL, LDLDIRECT in the last 72 hours. Thyroid Function Tests: No results for input(s): TSH, T4TOTAL, FREET4, T3FREE, THYROIDAB in the last 72 hours. Anemia Panel: Recent Labs    07/22/19 0226  FERRITIN 187  TIBC 251  IRON 28   Urine analysis:    Component Value Date/Time   COLORURINE YELLOW 07/21/2019 0333   APPEARANCEUR HAZY (A) 07/21/2019 0333   LABSPEC 1.013 07/21/2019 0333   PHURINE 6.0 07/21/2019 0333   GLUCOSEU NEGATIVE 07/21/2019 0333   HGBUR NEGATIVE 07/21/2019 0333   BILIRUBINUR NEGATIVE 07/21/2019 0333   KETONESUR 5 (A) 07/21/2019 0333   PROTEINUR NEGATIVE 07/21/2019 0333   NITRITE NEGATIVE 07/21/2019 0333   LEUKOCYTESUR TRACE (A)  07/21/2019 0333   Sepsis Labs: _1 (procalcitonin:4,lacticidven:4) ) Recent Results (from the past 240 hour(s))  Respiratory Panel by RT PCR (Flu A&B, Covid) - Nasopharyngeal Swab     Status: None   Collection Time: 07/20/19  7:38 PM   Specimen: Nasopharyngeal Swab  Result Value Ref Range Status   SARS Coronavirus 2 by RT PCR NEGATIVE NEGATIVE Final    Comment: (NOTE) SARS-CoV-2 target nucleic acids are NOT DETECTED. The SARS-CoV-2 RNA is generally detectable in upper respiratoy specimens during the acute phase of infection. The lowest concentration  of SARS-CoV-2 viral copies this assay can detect is 131 copies/mL. A negative result does not preclude SARS-Cov-2 infection and should not be used as the sole basis for treatment or other patient management decisions. A negative result may occur with  improper specimen collection/handling, submission of specimen other than nasopharyngeal swab, presence of viral mutation(s) within the areas targeted by this assay, and inadequate number of viral copies (<131 copies/mL). A negative result must be combined with clinical observations, patient history, and epidemiological information. The expected result is Negative. Fact Sheet for Patients:  PinkCheek.be Fact Sheet for Healthcare Providers:  GravelBags.it This test is not yet ap proved or cleared by the [place] FDA and  has been authorized for detection and/or diagnosis of SARS-CoV-2 by FDA under an Emergency Use Authorization (EUA). This EUA will remain  in effect (meaning this test can be used) for the duration of the COVID-19 declaration under Section 564(b)(1) of the Act, 21 U.S.C. section 360bbb-3(b)(1), unless the authorization is terminated or revoked sooner.    Influenza A by PCR NEGATIVE NEGATIVE Final   Influenza B by PCR NEGATIVE NEGATIVE Final    Comment: (NOTE) The Xpert Xpress SARS-CoV-2/FLU/RSV assay is  intended as an aid in  the diagnosis of influenza from Nasopharyngeal swab specimens and  should not be used as a sole basis for treatment. Nasal washings and  aspirates are unacceptable for Xpert Xpress SARS-CoV-2/FLU/RSV  testing. Fact Sheet for Patients: PinkCheek.be Fact Sheet for Healthcare Providers: GravelBags.it This test is not yet approved or cleared by the [place] FDA and  has been authorized for detection and/or diagnosis of SARS-CoV-2 by  FDA under an Emergency Use Authorization (EUA). This EUA will remain  in effect (meaning this test can be used) for the duration of the  Covid-19 declaration under Section 564(b)(1) of the Act, 21  U.S.C. section 360bbb-3(b)(1), unless the authorization is  terminated or revoked. Performed at [facility], [place] [address] [place], [place] [address]   MRSA PCR Screening     Status: None   Collection Time: 07/21/19  8:41 AM   Specimen: Nasopharyngeal  Result Value Ref Range Status   MRSA by PCR NEGATIVE NEGATIVE Final    Comment:        The GeneXpert MRSA Assay (FDA approved for NASAL specimens only), is one component of a comprehensive MRSA colonization surveillance program. It is not intended to diagnose MRSA infection nor to guide or monitor treatment for MRSA infections. Performed at [facility], [place] [address], [place], [place] [address]          Radiology Studies: No results found.    Scheduled Meds: . azaTHIOprine  50 mg Oral TID  . budesonide (PULMICORT) nebulizer solution  0.25 mg Nebulization BID  . chlorpheniramine-HYDROcodone  5 mL Oral Q12H  . dextromethorphan-guaiFENesin  1 tablet Oral BID  . enoxaparin (LOVENOX) injection  40 mg Subcutaneous Q24H  . pantoprazole  40 mg Oral Daily  . Pirfenidone  3 tablet Oral TID  . predniSONE  60 mg Oral Q breakfast  . simvastatin  20 mg Oral q1800  . sulfamethoxazole-trimethoprim  1 tablet  Oral Once per day on Mon Wed Fri   Continuous Infusions: . azithromycin Stopped (07/22/19 1906)  . cefTRIAXone (ROCEPHIN)  IV Stopped (07/22/19 1639)     LOS: 3 days      Debbe Odea, MD [facility] Pager: [email or web address] Password [email or web address] 07/23/2019, 4:24 PM

## 2019-07-23 NOTE — Progress Notes (Signed)
Patient had CMP during hospital encounter. AST/ALT within normal limits and will continue to monitor every 3 months.  Mariella Saa, PharmD, [place], [place] [facility]ty Pharmacist [phone]  07/23/2019 9:47 AM

## 2019-07-23 NOTE — Consult Note (Signed)
NAME:  Stacy [name], MRN:  [id number], DOB:  01/08/1956, LOS: 3 ADMISSION DATE:  07/20/2019, CONSULTATION DATE:  07/23/19 REFERRING MD:  Wynelle Cleveland, CHIEF COMPLAINT:  Dyspnea   Brief History   64 year old woman with a history of UIP followed by Dr. Vaughan Browner presenting with acute on chronic hypoxemic respiratory failure and abnormal CT scan of chest.  History of present illness   This is a 64 year old woman with a history of biopsy-proven UIP on chronic prednisone, Imuran, esbriet followed by Dr. Vaughan Browner who is presenting with 1 month of worsening cough and shortness of breath on exertion.  She is normally on 4 L of oxygen at home but has recently needed to go to 6 L.  This is culminated in an observation admission.  Pulmonology is consulted to assist with her work-up.  Her CT scan is notable for a dense left upper lobe infiltrate.  Her procalcitonin is interestingly negative.  She does have chills but denies any fevers.  Sputum initially beige in color now more clear.  She has mild pleurisy which is chronic for her.  50 pack year smoking history.  Past Medical History   Past Medical History:  Diagnosis Date  . GERD (gastroesophageal reflux disease)   . Pneumonia   . Pulmonary fibrosis ([place])      Significant Hospital Events   N/A  Consults:  Pulm  Procedures:  N/A  Significant Diagnostic Tests:  CTA Chest IMPRESSION: 1. Left upper lobe pneumonia. Clinical correlation and follow-up to resolution recommended. 2. No large central pulmonary artery embolus. 3. Mild cardiomegaly with findings of right heart dysfunction. Correlation with echocardiogram recommended. 4. Changes of pulmonary fibrosis. 5. Bilateral hilar adenopathy, reactive.  Micro Data:  COVID Neg Flu Neg HIV Neg  Antimicrobials:  Ceftriaxone/azithromycin   Interim history/subjective:  Consulted.  Objective   Blood pressure 119/77, pulse 87, temperature 98.1 F (36.7 C), temperature source Oral, resp. rate 16,  height _0  (1.676 m), weight 66.1 kg, SpO2 96 %.        Intake/Output Summary (Last 24 hours) at 07/23/2019 1140 Last data filed at 07/22/2019 1950 Gross per 24 hour  Intake 740.06 ml  Output --  Net 740.06 ml   Filed Weights   07/21/19 1617 07/22/19 0513  Weight: 66 kg 66.1 kg    Examination: General: very pleasant woman in NAD HENT: MMM, trachea midline Lungs: bibasilar crackles and rhonci on left Cardiovascular: RRR, ext warm Abdomen: Soft, +BS3 Extremities: No edema Neuro: Moves all 4 ext to command Skin: no rashes  Resolved Hospital Problem list   N/A  Assessment & Plan:  # Left upper lobe infiltrate- hopefully just CAP, immunocompromised status, smoking history, and insidious onset make other diagnoses possible. # Acute on chronic hypoxemia, improving. # UIP, possible element of acute exacerbation of this.  - Sputum for fungal/bacterial/AFB culture - Rx for 7 more days of 776m levaquin - For steroids, do 40 mg/day x 1 week then 226mday x 1 week then resume home 5m14may - Needs close OP f/u, will arrange for Wednesday tele-visit: (A) assure improved symptoms (B) review sputum results (C) arrange either f/u CXR or CT in about 4 weeks - Fine for home once sputum sample obtained   Labs   CBC: Recent Labs  Lab 07/20/19 1103 07/20/19 1620 07/21/19 0535 07/22/19 0226 07/23/19 0309  WBC 20.6 Repeated and verified X2.* 19.0* 16.9* 15.0* 12.3*  NEUTROABS 16.8* 15.8*  --   --   --   HGB 11.8* 12.1 10.7*  10.1* 9.3*  HCT 39.3 39.6 34.2* 33.1* 30.9*  MCV 76.8* 77.3* 74.7* 76.8* 77.1*  PLT 691.0* 641* 535* 524* 502*    Basic Metabolic Panel: Recent Labs  Lab 07/20/19 1620 07/21/19 0535 07/22/19 0226 07/23/19 0309  NA 140 138 142 142  K 4.1 4.1 4.3 4.6  CL 101 102 105 106  CO2 _0 GLUCOSE 128* 93 148* 151*  BUN 7* <5* 6* <5*  CREATININE 0.74 0.72 0.70 0.62  CALCIUM 9.3 8.5* 8.7* 8.3*  MG  --  1.8  --   --   PHOS  --  3.3  --   --     GFR: Estimated Creatinine Clearance: 65.6 mL/min (by C-G formula based on SCr of 0.62 mg/dL). Recent Labs  Lab 07/20/19 1620 07/21/19 0535 07/22/19 0226 07/23/19 0309  PROCALCITON  --  0.16  --   --   WBC 19.0* 16.9* 15.0* 12.3*    Liver Function Tests: Recent Labs  Lab 07/23/19 0309  AST 16  ALT 12  ALKPHOS 41  BILITOT 0.2*  PROT 5.9*  ALBUMIN 2.3*   No results for input(s): LIPASE, AMYLASE in the last 168 hours. No results for input(s): AMMONIA in the last 168 hours.  ABG No results found for: PHART, PCO2ART, PO2ART, HCO3, TCO2, ACIDBASEDEF, O2SAT   Coagulation Profile: No results for input(s): INR, PROTIME in the last 168 hours.  Cardiac Enzymes: No results for input(s): CKTOTAL, CKMB, CKMBINDEX, TROPONINI in the last 168 hours.  HbA1C: No results found for: HGBA1C  CBG: No results for input(s): GLUCAP in the last 168 hours.  Review of Systems:    Positive Symptoms in bold:  Constitutional fevers, chills, weight loss, fatigue, anorexia, malaise  Eyes decreased vision, double vision, eye irritation  Ears, Nose, Mouth, Throat sore throat, trouble swallowing, sinus congestion  Cardiovascular chest pain, paroxysmal nocturnal dyspnea, lower ext edema, palpitations   Respiratory SOB, cough, DOE, hemoptysis, wheezing  Gastrointestinal nausea, vomiting, diarrhea  Genitourinary burning with urination, trouble urinating  Musculoskeletal joint aches, joint swelling, back pain  Integumentary  rashes, skin lesions  Neurological focal weakness, focal numbness, trouble speaking, headaches  Psychiatric depression, anxiety, confusion  Endocrine polyuria, polydipsia, cold intolerance, heat intolerance  Hematologic abnormal bruising, abnormal bleeding, unexplained nose bleeds  Allergic/Immunologic recurrent infections, hives, swollen lymph nodes     Past Medical History  She,  has a past medical history of GERD (gastroesophageal reflux disease), Pneumonia, and  Pulmonary fibrosis ([place]).   Surgical History    Past Surgical History:  Procedure Laterality Date  . ANKLE SURGERY Left    fx, has plates  . COLONOSCOPY    . ESOPHAGOGASTRODUODENOSCOPY    . HEMORRHOID SURGERY    . LUNG BIOPSY    . TUBAL LIGATION       Social History   reports that she quit smoking about 26 years ago. Her smoking use included cigarettes. She started smoking about 52 years ago. She smoked 2.00 packs per day. She has never used smokeless tobacco. She reports current alcohol use. She reports that she does not use drugs.   Family History   Her family history includes Asthma in her brother; Cancer in her father; Diabetes in her mother.   Allergies No Known Allergies   Home Medications  Prior to Admission medications   Medication Sig Start Date End Date Taking? Authorizing Provider  acetaminophen (TYLENOL) 500 MG tablet Take 500 mg by mouth 2 (two) times a day. Take 3 tablets  twice daily   Yes [provider]  azaTHIOprine (IMURAN) 50 MG tablet TAKE 3 TABLETS EVERY DAY Patient taking differently: Take 50 mg by mouth 3 (three) times daily.  07/12/19  Yes Mannam, Praveen, MD  levofloxacin (LEVAQUIN) 500 MG tablet Take 1 tablet (500 mg total) by mouth daily. 07/20/19  Yes Martyn Ehrich, NP  Multiple Vitamins-Minerals (CENTRUM SILVER) tablet Take by mouth.   Yes [provider]  omeprazole (PRILOSEC) 20 MG capsule Take 1 capsule (20 mg total) by mouth daily. 12/28/18  Yes Marrian Salvage, [name]  OXYGEN Inhale into the lungs. 3 liters per minute, uses when up and moving around   Yes [provider]  predniSONE (DELTASONE) 5 MG tablet Take 5 mg by mouth daily with breakfast.   Yes [provider]  simvastatin (ZOCOR) 20 MG tablet TAKE 1 TABLET DAILY AT 6PM (NO FURTHER REFILLS WITHOUT OFFICE VISIT) 07/08/19  Yes Biagio Borg, MD  sulfamethoxazole-trimethoprim (BACTRIM DS) 800-160 MG tablet Take 1 tablet by mouth 3 (three) times a week.  TAKE 1 TABLET EVERY DAY ON MONDAY, WEDNESDAY AND FRIDAY 01/22/19  Yes Mannam, Praveen, MD  traZODone (DESYREL) 100 MG tablet TAKE 1 TABLET (100 MG TOTAL) BY MOUTH AT BEDTIME AS NEEDED FOR SLEEP (NO FURTHER REFILLS WITHOUT OFFICE VISIT) Patient taking differently: Take 100 mg by mouth at bedtime as needed for sleep.  07/08/19  Yes Biagio Borg, MD  ESBRIET 267 MG TABS TAKE 3 TABLETS (801MG) BY MOUTH THREE TIMES A DAY 07/22/19   Marshell Garfinkel, MD

## 2019-07-24 MED ORDER — LIP MEDEX EX OINT
1.0000 "application " | TOPICAL_OINTMENT | CUTANEOUS | Status: DC | PRN
  Administered 2019-07-24: 1 via TOPICAL
  Filled 2019-07-24: qty 7

## 2019-07-24 NOTE — Progress Notes (Signed)
PROGRESS NOTE    Stacy [name]   BWG:[id number]  DOB: 1956-04-04  DOA: 07/20/2019 PCP: Marrian Salvage, [name]   Brief Narrative:  Stacy [name]   is a 64 year old woman with a history of biopsy-proven UIP on chronic prednisone, Imuran, esbriet followed by Dr. Vaughan Browner who is presenting with 1 month of worsening cough and shortness of breath on exertion.  She is normally on 4 L of oxygen at home but has recently needed to go to 6 L.  This is culminated in an observation admission.  Pulmonology is consulted to assist with her work-up.  Her CT scan is notable for a dense left upper lobe infiltrate.  Her procalcitonin is interestingly negative.  She does have chills but denies any fevers.  Sputum initially beige in color now more clear.  She has mild pleurisy which is chronic for her.  50 pack year smoking history.   Subjective: Still is short of breath.     Assessment & Plan:   Active Problems:   Pulmonary fibrosis ([place])   Community acquired pneumonia- left upper lobe   Chronic respiratory failure with hypoxia ([place]) -Covid & flu negative -Continue IV antibiotics -Pulmonary eval completed today-sputum culture ordered by pulmonary -Continue Imuran, Pulmicort, duo nebs, prednisone and Pirfenidone -Have added Tussionex and Mucinex DM - still requiring 5 L O2 at rest- cont to follow in the hospital  Time spent in minutes: 35 DVT prophylaxis: Lovenox Code Status: Full code Family Communication:  Disposition Plan: home Consultants:   pulm Procedures:   none Antimicrobials:  Anti-infectives (From admission, onward)   Start     Dose/Rate Route Frequency Ordered Stop   07/21/19 1800  azithromycin (ZITHROMAX) 500 mg in sodium chloride 0.9 % 250 mL IVPB     500 mg 250 mL/hr over 60 Minutes Intravenous Every 24 hours 07/21/19 0333     07/21/19 1600  cefTRIAXone (ROCEPHIN) 1 g in sodium chloride 0.9 % 100 mL IVPB     1 g 200 mL/hr over 30 Minutes Intravenous Every 24 hours  07/21/19 0333     07/21/19 0900  sulfamethoxazole-trimethoprim (BACTRIM DS) 800-160 MG per tablet 1 tablet    Note to Pharmacy: TAKE 1 TABLET EVERY DAY ON MONDAY, [facility] AND FRIDAY     1 tablet Oral Once per day on Mon Wed Fri 07/21/19 0333     07/20/19 1715  cefTRIAXone (ROCEPHIN) 1 g in sodium chloride 0.9 % 100 mL IVPB     1 g 200 mL/hr over 30 Minutes Intravenous  Once 07/20/19 1706 07/20/19 1838   07/20/19 1715  azithromycin (ZITHROMAX) 500 mg in sodium chloride 0.9 % 250 mL IVPB     500 mg 250 mL/hr over 60 Minutes Intravenous  Once 07/20/19 1706 07/20/19 2109       Objective: Vitals:   07/24/19 0823 07/24/19 0842 07/24/19 1227 07/24/19 1332  BP:   109/69   Pulse:   90   Resp:   18 19  Temp:   97.9 F (36.6 C)   TempSrc:   Oral   SpO2:  91% 98% 93%  Weight: 70.8 kg     Height:        Intake/Output Summary (Last 24 hours) at 07/24/2019 1646 Last data filed at 07/24/2019 1200 Gross per 24 hour  Intake 830 ml  Output --  Net 830 ml   Filed Weights   07/21/19 1617 07/22/19 0513 07/24/19 0823  Weight: 66 kg 66.1 kg 70.8 kg    Examination: General  exam: Appears comfortable  HEENT: PERRLA, oral mucosa moist, no sclera icterus or thrush Respiratory system: crackles at bases- Respiratory effort normal. Cardiovascular system: S1 & S2 heard,  No murmurs  Gastrointestinal system: Abdomen soft, non-tender, nondistended. Normal bowel sounds   Central nervous system: Alert and oriented. No focal neurological deficits. Extremities: No cyanosis, clubbing or edema Skin: No rashes or ulcers Psychiatry:  Mood & affect appropriate.   Data Reviewed: I have personally reviewed following labs and imaging studies  CBC: Recent Labs  Lab 07/20/19 1103 07/20/19 1620 07/21/19 0535 07/22/19 0226 07/23/19 0309  WBC 20.6 Repeated and verified X2.* 19.0* 16.9* 15.0* 12.3*  NEUTROABS 16.8* 15.8*  --   --   --   HGB 11.8* 12.1 10.7* 10.1* 9.3*  HCT 39.3 39.6 34.2* 33.1* 30.9*   MCV 76.8* 77.3* 74.7* 76.8* 77.1*  PLT 691.0* 641* 535* 524* 175*   Basic Metabolic Panel: Recent Labs  Lab 07/20/19 1620 07/21/19 0535 07/22/19 0226 07/23/19 0309  NA 140 138 142 142  K 4.1 4.1 4.3 4.6  CL 101 102 105 106  CO2 _0 GLUCOSE 128* 93 148* 151*  BUN 7* <5* 6* <5*  CREATININE 0.74 0.72 0.70 0.62  CALCIUM 9.3 8.5* 8.7* 8.3*  MG  --  1.8  --   --   PHOS  --  3.3  --   --    GFR: Estimated Creatinine Clearance: 65.6 mL/min (by C-G formula based on SCr of 0.62 mg/dL). Liver Function Tests: Recent Labs  Lab 07/23/19 0309  AST 16  ALT 12  ALKPHOS 41  BILITOT 0.2*  PROT 5.9*  ALBUMIN 2.3*   No results for input(s): LIPASE, AMYLASE in the last 168 hours. No results for input(s): AMMONIA in the last 168 hours. Coagulation Profile: No results for input(s): INR, PROTIME in the last 168 hours. Cardiac Enzymes: No results for input(s): CKTOTAL, CKMB, CKMBINDEX, TROPONINI in the last 168 hours. BNP (last 3 results) Recent Labs    01/28/19 1602  PROBNP 74.0   HbA1C: No results for input(s): HGBA1C in the last 72 hours. CBG: No results for input(s): GLUCAP in the last 168 hours. Lipid Profile: No results for input(s): CHOL, HDL, LDLCALC, TRIG, CHOLHDL, LDLDIRECT in the last 72 hours. Thyroid Function Tests: No results for input(s): TSH, T4TOTAL, FREET4, T3FREE, THYROIDAB in the last 72 hours. Anemia Panel: Recent Labs    07/22/19 0226  FERRITIN 187  TIBC 251  IRON 28   Urine analysis:    Component Value Date/Time   COLORURINE YELLOW 07/21/2019 0333   APPEARANCEUR HAZY (A) 07/21/2019 0333   LABSPEC 1.013 07/21/2019 0333   PHURINE 6.0 07/21/2019 0333   GLUCOSEU NEGATIVE 07/21/2019 0333   HGBUR NEGATIVE 07/21/2019 0333   BILIRUBINUR NEGATIVE 07/21/2019 0333   KETONESUR 5 (A) 07/21/2019 0333   PROTEINUR NEGATIVE 07/21/2019 0333   NITRITE NEGATIVE 07/21/2019 0333   LEUKOCYTESUR TRACE (A) 07/21/2019 0333   Sepsis  Labs: _1 (procalcitonin:4,lacticidven:4) ) Recent Results (from the past 240 hour(s))  Respiratory Panel by RT PCR (Flu A&B, Covid) - Nasopharyngeal Swab     Status: None   Collection Time: 07/20/19  7:38 PM   Specimen: Nasopharyngeal Swab  Result Value Ref Range Status   SARS Coronavirus 2 by RT PCR NEGATIVE NEGATIVE Final    Comment: (NOTE) SARS-CoV-2 target nucleic acids are NOT DETECTED. The SARS-CoV-2 RNA is generally detectable in upper respiratoy specimens during the acute phase of infection. The lowest concentration of SARS-CoV-2  viral copies this assay can detect is 131 copies/mL. A negative result does not preclude SARS-Cov-2 infection and should not be used as the sole basis for treatment or other patient management decisions. A negative result may occur with  improper specimen collection/handling, submission of specimen other than nasopharyngeal swab, presence of viral mutation(s) within the areas targeted by this assay, and inadequate number of viral copies (<131 copies/mL). A negative result must be combined with clinical observations, patient history, and epidemiological information. The expected result is Negative. Fact Sheet for Patients:  PinkCheek.be Fact Sheet for Healthcare Providers:  GravelBags.it This test is not yet ap proved or cleared by the [place] FDA and  has been authorized for detection and/or diagnosis of SARS-CoV-2 by FDA under an Emergency Use Authorization (EUA). This EUA will remain  in effect (meaning this test can be used) for the duration of the COVID-19 declaration under Section 564(b)(1) of the Act, 21 U.S.C. section 360bbb-3(b)(1), unless the authorization is terminated or revoked sooner.    Influenza A by PCR NEGATIVE NEGATIVE Final   Influenza B by PCR NEGATIVE NEGATIVE Final    Comment: (NOTE) The Xpert Xpress SARS-CoV-2/FLU/RSV assay is intended as an aid in  the  diagnosis of influenza from Nasopharyngeal swab specimens and  should not be used as a sole basis for treatment. Nasal washings and  aspirates are unacceptable for Xpert Xpress SARS-CoV-2/FLU/RSV  testing. Fact Sheet for Patients: PinkCheek.be Fact Sheet for Healthcare Providers: GravelBags.it This test is not yet approved or cleared by the [place] FDA and  has been authorized for detection and/or diagnosis of SARS-CoV-2 by  FDA under an Emergency Use Authorization (EUA). This EUA will remain  in effect (meaning this test can be used) for the duration of the  Covid-19 declaration under Section 564(b)(1) of the Act, 21  U.S.C. section 360bbb-3(b)(1), unless the authorization is  terminated or revoked. Performed at [facility], [place] [address] [place], [place] [address]   MRSA PCR Screening     Status: None   Collection Time: 07/21/19  8:41 AM   Specimen: Nasopharyngeal  Result Value Ref Range Status   MRSA by PCR NEGATIVE NEGATIVE Final    Comment:        The GeneXpert MRSA Assay (FDA approved for NASAL specimens only), is one component of a comprehensive MRSA colonization surveillance program. It is not intended to diagnose MRSA infection nor to guide or monitor treatment for MRSA infections. Performed at [facility], [place] [address] [place], [place] [address]          Radiology Studies: No results found.    Scheduled Meds: . azaTHIOprine  50 mg Oral TID  . budesonide (PULMICORT) nebulizer solution  0.25 mg Nebulization BID  . chlorpheniramine-HYDROcodone  5 mL Oral Q12H  . dextromethorphan-guaiFENesin  1 tablet Oral BID  . enoxaparin (LOVENOX) injection  40 mg Subcutaneous Q24H  . pantoprazole  40 mg Oral Daily  . Pirfenidone  3 tablet Oral TID  . predniSONE  60 mg Oral Q breakfast  . simvastatin  20 mg Oral q1800  . sulfamethoxazole-trimethoprim  1 tablet Oral Once per day on Mon Wed  Fri   Continuous Infusions: . azithromycin 500 mg (07/23/19 1753)  . cefTRIAXone (ROCEPHIN)  IV 1 g (07/24/19 1642)     LOS: 4 days      Debbe Odea, MD [facility] Pager: [email or web address] Password [facility] 07/24/2019, 4:46 PM

## 2019-07-24 NOTE — Progress Notes (Signed)
SATURATION QUALIFICATIONS: (This note is used to comply with regulatory documentation for home oxygen)  Patient Saturations on Room Air at Rest = %  Patient Saturations on Room Air while Ambulating = 77%  Patient Saturations on 5 Liters of oxygen while Ambulating = 93%  Please briefly explain why patient needs home oxygen:  Pt ambulated 100 ft without assistive devices. Pt tolerated fair. Pt had to stop to catch breath once

## 2019-07-25 DIAGNOSIS — K219 Gastro-esophageal reflux disease without esophagitis: Secondary | ICD-10-CM

## 2019-07-25 DIAGNOSIS — J9622 Acute and chronic respiratory failure with hypercapnia: Secondary | ICD-10-CM

## 2019-07-25 MED ORDER — PREDNISONE 10 MG PO TABS: ORAL_TABLET | ORAL | 0 refills | Status: DC

## 2019-07-25 MED ORDER — DM-GUAIFENESIN ER 30-600 MG PO TB12: 1.0000 | ORAL_TABLET | Freq: Two times a day (BID) | ORAL | 0 refills | Status: DC | PRN

## 2019-07-25 MED ORDER — HYDROCOD POLST-CPM POLST ER 10-8 MG/5ML PO SUER: 5.0000 mL | Freq: Two times a day (BID) | ORAL | 0 refills | Status: DC | PRN

## 2019-07-25 MED ORDER — LEVOFLOXACIN 750 MG PO TABS: 750.0000 mg | ORAL_TABLET | Freq: Every day | ORAL | 0 refills | Status: AC

## 2019-07-25 NOTE — Progress Notes (Signed)
Pt discharged via wheelchair, on oxygen [place] 5L, with RN  Pt has all belongings  Pt transferred to home oxygen in car

## 2019-07-25 NOTE — Progress Notes (Addendum)
Pt IV removed, catheter intact and telemetry removed  Pt discharge education provided at bedside  Pt has all belongings including printed prescription, home medications from pharamcy, and incentive spirometer  Awaiting pt discharge transportation

## 2019-07-25 NOTE — Discharge Summary (Signed)
Physician Discharge Summary  Stacy [name] KGU:[id number] DOB: Oct 06, 1955 DOA: 07/20/2019  PCP: Stacy Salvage, [name]  Admit date: 07/20/2019 Discharge date: 07/25/2019  Admitted From: home Disposition:  home   Recommendations for Outpatient Follow-up:  1. Wean O2 as able- currently on 4-5 L and normally on 2 at baseline   Discharge Condition:  stable   CODE STATUS:  Full code   Diet recommendation:  Heart healthy Consultations:  PCCM    Discharge Diagnoses:  Principal Problem:   Acute on chronic respiratory failure with hypercapnia ([place]) Active Problems:   Community acquired pneumonia   Pulmonary fibrosis ([place])   Gastroesophageal reflux disease   Osteoporosis     Brief Summary: Stacy [name]   is a 63 year old woman with a history of biopsy-proven UIP on chronic prednisone, Imuran, esbrietfollowed by Stacy. Vaughan [name] who is presenting with 1 month of worsening cough and shortness of breath on exertion. She is normally on 4 L of oxygen at home but has recently needed to go to 6 L. This is culminated in an observation admission. Pulmonology is consulted to assist with her work-up. Her CT scan is notable for a dense left upper lobe infiltrate. Her procalcitonin is interestingly negative. She does have chills but denies any fevers. Sputum initially beige in color now more clear.She has mild pleurisy which is chronic for her.50 pack year smoking history.   Hospital Course:  Pulmonary fibrosis With LUL Community acquired pneumonia  Acute on Chronic respiratory failure with hypoxia ([place]) -Covid & flu negative -Continued on IV antibiotics in the hospital- her cough and sputum production have improved significantly but still hypoxic and with dyspnea -Pulmonary eval completed based on patient's request-sputum culture ordered by pulmonary but she has no been able to produce much sputum -Continue Imuran, Pulmicort, duo nebs, prednisone and Pirfenidone- PCCM recommends a  slow prednisone taper with Levaquin on d/c and f/u with primary pulmonologist, Stacy [name] in a couple of weeks -Have added Tussionex and Mucinex DM - still requiring about 4 L O2 at rest and ~4-5 with exertion   Discharge Exam: Vitals:   07/25/19 0822 07/25/19 0845  BP: 124/69   Pulse: 73   Resp:    Temp:    SpO2:  96%   Vitals:   07/25/19 0541 07/25/19 0819 07/25/19 0822 07/25/19 0845  BP: 133/80  124/69   Pulse: 63  73   Resp: 19     Temp: 98.1 F (36.7 C)     TempSrc: Oral     SpO2: 96%   96%  Weight: 66.9 kg 70 kg    Height:        General: Pt is alert, awake, not in acute distress Cardiovascular: RRR, S1/S2 +, no rubs, no gallops Respiratory: crackles bilaterally,  no wheezing, no rhonchi Abdominal: Soft, NT, ND, bowel sounds + Extremities: no edema, no cyanosis   Discharge Instructions  Discharge Instructions    Diet - low sodium heart healthy   Complete by: As directed    Increase activity slowly   Complete by: As directed      Allergies as of 07/25/2019   No Known Allergies     Medication List    TAKE these medications   acetaminophen 500 MG tablet Commonly known as: TYLENOL Take 500 mg by mouth 2 (two) times a day. Take 3 tablets twice daily   azaTHIOprine 50 MG tablet Commonly known as: IMURAN TAKE 3 TABLETS EVERY DAY What changed: See the new instructions.   Centrum Silver  tablet Take by mouth.   chlorpheniramine-HYDROcodone 10-8 MG/5ML Suer Commonly known as: TUSSIONEX Take 5 mLs by mouth every 12 (twelve) hours as needed for cough.   dextromethorphan-guaiFENesin 30-600 MG 12hr tablet Commonly known as: MUCINEX DM Take 1 tablet by mouth 2 (two) times daily as needed for cough.   Esbriet 267 MG Tabs Generic drug: Pirfenidone TAKE 3 TABLETS (801MG) BY MOUTH THREE TIMES A DAY What changed: See the new instructions.   levofloxacin 750 MG tablet Commonly known as: Levaquin Take 1 tablet (750 mg total) by mouth daily for 5 days. Start  taking on: July 26, 2019   omeprazole 20 MG capsule Commonly known as: PRILOSEC Take 1 capsule (20 mg total) by mouth daily.   OXYGEN Inhale into the lungs. 3 liters per minute, uses when up and moving around   predniSONE 10 MG tablet Commonly known as: DELTASONE 40 mg daily x 7 days 20 mg daily x 7 days 10 mg daily x 7 days 5 mg daily What changed:   medication strength  how much to take  how to take this  when to take this  additional instructions   simvastatin 20 MG tablet Commonly known as: ZOCOR TAKE 1 TABLET DAILY AT 6PM (NO FURTHER REFILLS WITHOUT OFFICE VISIT)   sulfamethoxazole-trimethoprim 800-160 MG tablet Commonly known as: BACTRIM DS Take 1 tablet by mouth 3 (three) times a week. TAKE 1 TABLET EVERY DAY ON MONDAY, WEDNESDAY AND FRIDAY   traZODone 100 MG tablet Commonly known as: DESYREL TAKE 1 TABLET (100 MG TOTAL) BY MOUTH AT BEDTIME AS NEEDED FOR SLEEP (NO FURTHER REFILLS WITHOUT OFFICE VISIT) What changed: See the new instructions.      Follow-up Information    Stacy Salvage, [name]. Go on 07/28/2019.   Specialty: Internal Medicine Why: 10:40am; this is scheduled to be an in person visit but they will call within 48 hrs of appointment to assess for in person or telehealth. Please wear a mask. Contact information: [place] [address] [phone]          No Known Allergies   Procedures/Studies:    Chest xray  Result Date: 07/20/2019 CLINICAL DATA:  Cough.  History of pulmonary fibrosis EXAM: CHEST - 2 VIEW COMPARISON:  Chest CT Nov 23, 2018 FINDINGS: There is extensive underlying pulmonary fibrosis. There is an area of increased opacity in the posterior segment of the left upper lobe measuring 3.8 x 2.9 x 2.8 cm. There is consolidation in portions of the right upper lobe and right base. Heart size and pulmonary vascularity are normal. No adenopathy evident. No bone lesions. IMPRESSION: 1. Small irregular  nodular appearing opacity in the posterior segment of the left upper lobe measuring 3.8 x 2.9 x 2.8 cm. Focal neoplasm must be of concern given this appearance. Advise contrast enhanced chest CT to further assess. 2. Extensive fibrotic change bilaterally, grossly stable from prior study. 3. Areas of consolidation concerning for pneumonia in portions of the right upper lobe and right base. 4.  Cardiac silhouette normal.  No evident adenopathy. These results will be called to the ordering clinician or representative by the Radiologist Assistant, and communication documented in the PACS or zVision Dashboard. Electronically Signed   By: Lowella Grip III M.D.   On: 07/20/2019 11:24   CT Angio Chest PE W and/or Wo Contrast  Result Date: 07/20/2019 CLINICAL DATA:  64 year old female with shortness of breath and fever. Positive D-dimer. Concern for pulmonary embolism. EXAM: CT ANGIOGRAPHY  CHEST WITH CONTRAST TECHNIQUE: Multidetector CT imaging of the chest was performed using the standard protocol during bolus administration of intravenous contrast. Multiplanar CT image reconstructions and MIPs were obtained to evaluate the vascular anatomy. CONTRAST:  189m OMNIPAQUE IOHEXOL 350 MG/ML SOLN COMPARISON:  Chest CT dated 11/23/2018. FINDINGS: Cardiovascular: There is mild cardiomegaly. There is retrograde flow of contrast from the right atrium into the IVC in keeping with right heart dysfunction. No pericardial effusion. Mild atherosclerotic calcification of the thoracic aorta. Mildly dilated main pulmonary trunk suggestive of a degree of pulmonary hypertension. Clinical correlation is recommended. Evaluation of the pulmonary arteries is somewhat limited due to respiratory motion artifact and suboptimal visualization of the peripheral branches. No large central pulmonary artery embolus identified. Mediastinum/Nodes: Bilateral hilar adenopathy measure up to 15 mm in short axis on the left. The esophagus is grossly  unremarkable. Asymmetric enlargement of the right thyroid lobe, likely related to underlying nodule. Ultrasound may provide better evaluation. No mediastinal fluid collection. Lungs/Pleura: Background of interstitial lung disease with subpleural reticulation and honeycombing. There is a large area of consolidative change in the left upper lobe with air bronchogram most consistent with pneumonia. Clinical correlation and follow-up to resolution recommended to exclude an underlying mass. There is no pleural effusion or pneumothorax. The central airways are patent. Upper Abdomen: Partially visualized 5.5 cm cyst in the interpolar aspect of the right kidney. Musculoskeletal: No chest wall abnormality. No acute or significant osseous findings. Review of the MIP images confirms the above findings. IMPRESSION: 1. Left upper lobe pneumonia. Clinical correlation and follow-up to resolution recommended. 2. No large central pulmonary artery embolus. 3. Mild cardiomegaly with findings of right heart dysfunction. Correlation with echocardiogram recommended. 4. Changes of pulmonary fibrosis. 5. Bilateral hilar adenopathy, reactive. Electronically Signed   By: AAnner CreteM.D.   On: 07/20/2019 18:33     The results of significant diagnostics from this hospitalization (including imaging, microbiology, ancillary and laboratory) are listed below for reference.     Microbiology: Recent Results (from the past 240 hour(s))  Respiratory Panel by RT PCR (Flu A&B, Covid) - Nasopharyngeal Swab     Status: None   Collection Time: 07/20/19  7:38 PM   Specimen: Nasopharyngeal Swab  Result Value Ref Range Status   SARS Coronavirus 2 by RT PCR NEGATIVE NEGATIVE Final    Comment: (NOTE) SARS-CoV-2 target nucleic acids are NOT DETECTED. The SARS-CoV-2 RNA is generally detectable in upper respiratoy specimens during the acute phase of infection. The lowest concentration of SARS-CoV-2 viral copies this assay can detect is 131  copies/mL. A negative result does not preclude SARS-Cov-2 infection and should not be used as the sole basis for treatment or other patient management decisions. A negative result may occur with  improper specimen collection/handling, submission of specimen other than nasopharyngeal swab, presence of viral mutation(s) within the areas targeted by this assay, and inadequate number of viral copies (<131 copies/mL). A negative result must be combined with clinical observations, patient history, and epidemiological information. The expected result is Negative. Fact Sheet for Patients:  hPinkCheek.beFact Sheet for Healthcare Providers:  hGravelBags.itThis test is not yet ap proved or cleared by the [place] and  has been authorized for detection and/or diagnosis of SARS-CoV-2 by FDA under an Emergency Use Authorization (EUA). This EUA will remain  in effect (meaning this test can be used) for the duration of the COVID-19 declaration under Section 564(b)(1) of the Act, 21 U.S.C. section 360bbb-3(b)(1), unless the authorization  is terminated or revoked sooner.    Influenza A by PCR NEGATIVE NEGATIVE Final   Influenza B by PCR NEGATIVE NEGATIVE Final    Comment: (NOTE) The Xpert Xpress SARS-CoV-2/FLU/RSV assay is intended as an aid in  the diagnosis of influenza from Nasopharyngeal swab specimens and  should not be used as a sole basis for treatment. Nasal washings and  aspirates are unacceptable for Xpert Xpress SARS-CoV-2/FLU/RSV  testing. Fact Sheet for Patients: PinkCheek.be Fact Sheet for Healthcare Providers: GravelBags.it This test is not yet approved or cleared by the [place] FDA and  has been authorized for detection and/or diagnosis of SARS-CoV-2 by  FDA under an Emergency Use Authorization (EUA). This EUA will remain  in effect (meaning this test can  be used) for the duration of the  Covid-19 declaration under Section 564(b)(1) of the Act, 21  U.S.C. section 360bbb-3(b)(1), unless the authorization is  terminated or revoked. Performed at [facility], [place] [address], [place], [place] [address]   MRSA PCR Screening     Status: None   Collection Time: 07/21/19  8:41 AM   Specimen: Nasopharyngeal  Result Value Ref Range Status   MRSA by PCR NEGATIVE NEGATIVE Final    Comment:        The GeneXpert MRSA Assay (FDA approved for NASAL specimens only), is one component of a comprehensive MRSA colonization surveillance program. It is not intended to diagnose MRSA infection nor to guide or monitor treatment for MRSA infections. Performed at [facility], [place] [address] [place], [place] [address]      Labs: BNP (last 3 results) No results for input(s): BNP in the last 8760 hours. Basic Metabolic Panel: Recent Labs  Lab 07/20/19 1620 07/21/19 0535 07/22/19 0226 07/23/19 0309  NA 140 138 142 142  K 4.1 4.1 4.3 4.6  CL 101 102 105 106  CO2 _0 GLUCOSE 128* 93 148* 151*  BUN 7* <5* 6* <5*  CREATININE 0.74 0.72 0.70 0.62  CALCIUM 9.3 8.5* 8.7* 8.3*  MG  --  1.8  --   --   PHOS  --  3.3  --   --    Liver Function Tests: Recent Labs  Lab 07/23/19 0309  AST 16  ALT 12  ALKPHOS 41  BILITOT 0.2*  PROT 5.9*  ALBUMIN 2.3*   No results for input(s): LIPASE, AMYLASE in the last 168 hours. No results for input(s): AMMONIA in the last 168 hours. CBC: Recent Labs  Lab 07/20/19 1103 07/20/19 1620 07/21/19 0535 07/22/19 0226 07/23/19 0309  WBC 20.6 Repeated and verified X2.* 19.0* 16.9* 15.0* 12.3*  NEUTROABS 16.8* 15.8*  --   --   --   HGB 11.8* 12.1 10.7* 10.1* 9.3*  HCT 39.3 39.6 34.2* 33.1* 30.9*  MCV 76.8* 77.3* 74.7* 76.8* 77.1*  PLT 691.0* 641* 535* 524* 502*   Cardiac Enzymes: No results for input(s): CKTOTAL, CKMB, CKMBINDEX, TROPONINI in the last 168 hours. BNP: Invalid input(s):  POCBNP CBG: No results for input(s): GLUCAP in the last 168 hours. D-Dimer No results for input(s): DDIMER in the last 72 hours. Hgb A1c No results for input(s): HGBA1C in the last 72 hours. Lipid Profile No results for input(s): CHOL, HDL, LDLCALC, TRIG, CHOLHDL, LDLDIRECT in the last 72 hours. Thyroid function studies No results for input(s): TSH, T4TOTAL, T3FREE, THYROIDAB in the last 72 hours.  Invalid input(s): FREET3 Anemia work up No results for input(s): VITAMINB12, FOLATE, FERRITIN, TIBC,  IRON, RETICCTPCT in the last 72 hours. Urinalysis    Component Value Date/Time   COLORURINE YELLOW 07/21/2019 0333   APPEARANCEUR HAZY (A) 07/21/2019 0333   LABSPEC 1.013 07/21/2019 0333   PHURINE 6.0 07/21/2019 0333   GLUCOSEU NEGATIVE 07/21/2019 0333   HGBUR NEGATIVE 07/21/2019 0333   BILIRUBINUR NEGATIVE 07/21/2019 0333   KETONESUR 5 (A) 07/21/2019 0333   PROTEINUR NEGATIVE 07/21/2019 0333   NITRITE NEGATIVE 07/21/2019 0333   LEUKOCYTESUR TRACE (A) 07/21/2019 0333   Sepsis Labs Invalid input(s): PROCALCITONIN,  WBC,  LACTICIDVEN Microbiology Recent Results (from the past 240 hour(s))  Respiratory Panel by RT PCR (Flu A&B, Covid) - Nasopharyngeal Swab     Status: None   Collection Time: 07/20/19  7:38 PM   Specimen: Nasopharyngeal Swab  Result Value Ref Range Status   SARS Coronavirus 2 by RT PCR NEGATIVE NEGATIVE Final    Comment: (NOTE) SARS-CoV-2 target nucleic acids are NOT DETECTED. The SARS-CoV-2 RNA is generally detectable in upper respiratoy specimens during the acute phase of infection. The lowest concentration of SARS-CoV-2 viral copies this assay can detect is 131 copies/mL. A negative result does not preclude SARS-Cov-2 infection and should not be used as the sole basis for treatment or other patient management decisions. A negative result may occur with  improper specimen collection/handling, submission of specimen other than nasopharyngeal swab, presence of  viral mutation(s) within the areas targeted by this assay, and inadequate number of viral copies (<131 copies/mL). A negative result must be combined with clinical observations, patient history, and epidemiological information. The expected result is Negative. Fact Sheet for Patients:  PinkCheek.be Fact Sheet for Healthcare Providers:  GravelBags.it This test is not yet ap proved or cleared by the [place] FDA and  has been authorized for detection and/or diagnosis of SARS-CoV-2 by FDA under an Emergency Use Authorization (EUA). This EUA will remain  in effect (meaning this test can be used) for the duration of the COVID-19 declaration under Section 564(b)(1) of the Act, 21 U.S.C. section 360bbb-3(b)(1), unless the authorization is terminated or revoked sooner.    Influenza A by PCR NEGATIVE NEGATIVE Final   Influenza B by PCR NEGATIVE NEGATIVE Final    Comment: (NOTE) The Xpert Xpress SARS-CoV-2/FLU/RSV assay is intended as an aid in  the diagnosis of influenza from Nasopharyngeal swab specimens and  should not be used as a sole basis for treatment. Nasal washings and  aspirates are unacceptable for Xpert Xpress SARS-CoV-2/FLU/RSV  testing. Fact Sheet for Patients: PinkCheek.be Fact Sheet for Healthcare Providers: GravelBags.it This test is not yet approved or cleared by the [place] FDA and  has been authorized for detection and/or diagnosis of SARS-CoV-2 by  FDA under an Emergency Use Authorization (EUA). This EUA will remain  in effect (meaning this test can be used) for the duration of the  Covid-19 declaration under Section 564(b)(1) of the Act, 21  U.S.C. section 360bbb-3(b)(1), unless the authorization is  terminated or revoked. Performed at [facility], [place] [address] [place], [place] [address]   MRSA PCR Screening     Status: None    Collection Time: 07/21/19  8:41 AM   Specimen: Nasopharyngeal  Result Value Ref Range Status   MRSA by PCR NEGATIVE NEGATIVE Final    Comment:        The GeneXpert MRSA Assay (FDA approved for NASAL specimens only), is one component of a comprehensive MRSA colonization surveillance program. It is not intended to diagnose MRSA infection nor  to guide or monitor treatment for MRSA infections. Performed at [facility], [place] [address], [place], [place] [address]      Time coordinating discharge in minutes: 20  SIGNED:   Debbe Odea, MD  [facility] 07/25/2019, 12:24 PM Pager   If 7PM-7AM, please contact night-coverage [email or web address] Password [email or web address]

## 2019-07-26 NOTE — Telephone Encounter (Signed)
I have reviewed her hospital discharge notes. I think she was mistakenly scheduled here for Wednesday. The pulmonology consult note indicated they wanted to do a follow-up with her on Wednesday- not with me. She can cancel follow-up here and needs to schedule with her pulmonologist.

## 2019-07-26 NOTE — Telephone Encounter (Signed)
Spoke with patient and appointment cancelled.

## 2019-07-27 ENCOUNTER — Inpatient Hospital Stay: Payer: Medicare HMO | Admitting: Internal Medicine

## 2019-07-27 ENCOUNTER — Encounter: Payer: Self-pay | Admitting: Internal Medicine

## 2019-07-27 ENCOUNTER — Other Ambulatory Visit: Payer: Self-pay

## 2019-07-27 ENCOUNTER — Ambulatory Visit (INDEPENDENT_AMBULATORY_CARE_PROVIDER_SITE_OTHER): Payer: Medicare HMO | Admitting: Internal Medicine

## 2019-07-27 VITALS — BP 110/64 | HR 68 | Temp 97.6°F | Ht 66.0 in | Wt 148.6 lb

## 2019-07-27 DIAGNOSIS — J189 Pneumonia, unspecified organism: Secondary | ICD-10-CM | POA: Diagnosis not present

## 2019-07-27 MED ORDER — HYDROCOD POLST-CPM POLST ER 10-8 MG/5ML PO SUER: 5.0000 mL | Freq: Two times a day (BID) | ORAL | 0 refills | Status: DC | PRN

## 2019-07-27 NOTE — Patient Instructions (Addendum)
-  Continue to increase activity as needed - Continue steroid taper - 3 week appointment with Dr. Vaughan Browner and chest x-ray

## 2019-07-27 NOTE — Progress Notes (Signed)
Pulmonary F/u Note  S: Here after hospitalization f/u for pneumonia and AE-ILD (UIP).  Slowly improving but still having cough with exertion.  Energy levels improving.  On steroid taper and levaquin.  Was hoping to get a sputum sample in hospital but she was never able to produce. Remains on 4L O2.  O: Today's Vitals   07/27/19 1155  BP: 110/64  Pulse: 68  Temp: 97.6 F (36.4 C)  TempSrc: Temporal  SpO2: 98%  Weight: 148 lb 9.6 oz (67.4 kg)  Height: _0  (1.676 m)   Body mass index is 23.98 kg/m. Appears to be in no acute distress Lung sounds improved with less crackles and wheezing from exam in hospital on 07/24/19 Heart sounds regular Ext warm Ambulatory  A: # AE-UIP and left upper lobe pneumonia.  Immunosuppressed and hx of smoking so need to make sure this resolves or will need bronch.  Improving symptomatically with steroids and abx. - Continue steroid taper and levaquin - Increase activity as tolerated - Cough suppressants fine for short term comfort - Continue esbriet, imuran, bactrim ppx - 3 week visit with chest x-ray with Dr. Vaughan Browner, patient will call sooner if feeling unwel - Will route to Dr. Zorita Pang MD PCCM

## 2019-07-27 NOTE — Progress Notes (Signed)
chae

## 2019-07-28 ENCOUNTER — Inpatient Hospital Stay: Payer: Medicare HMO | Admitting: Family

## 2019-08-16 ENCOUNTER — Ambulatory Visit: Payer: Medicare HMO | Admitting: Family

## 2019-08-18 ENCOUNTER — Telehealth: Payer: Self-pay | Admitting: Pulmonary Disease

## 2019-08-18 NOTE — Telephone Encounter (Signed)
Called and spoke with pt about the call received from her granddaughter. Pt stated she was going to go to the hospital to be evaluated due to not feeling well. Stated to her to call us if there was anything she needed and she verbalized understanding. Nothing further needed.

## 2019-08-18 NOTE — Telephone Encounter (Signed)
according to grand daughter - pt is feeling like she has pneumonia again - throwing up, fever, body aches, not eating, breathing getting worse - granddaughter states that she is reluctant to go to the hospital or come see [place] - please advise pt - CB#  [phone]

## 2019-08-18 NOTE — Telephone Encounter (Signed)
Sent before I checked sick call and urgent - sorry!

## 2019-08-19 ENCOUNTER — Emergency Department (HOSPITAL_COMMUNITY): Payer: Medicare HMO

## 2019-08-19 ENCOUNTER — Other Ambulatory Visit: Payer: Self-pay

## 2019-08-19 ENCOUNTER — Encounter (HOSPITAL_COMMUNITY): Payer: Self-pay | Admitting: Emergency Medicine

## 2019-08-19 ENCOUNTER — Emergency Department (HOSPITAL_COMMUNITY)
Admission: EM | Admit: 2019-08-19 | Discharge: 2019-08-19 | Disposition: A | Discharge status: Home / Self Care | Payer: Medicare HMO | Source: Home / Self Care | Attending: Emergency Medicine | Admitting: Emergency Medicine

## 2019-08-19 DIAGNOSIS — R630 Anorexia: Secondary | ICD-10-CM | POA: Diagnosis present

## 2019-08-19 DIAGNOSIS — J449 Chronic obstructive pulmonary disease, unspecified: Secondary | ICD-10-CM | POA: Diagnosis not present

## 2019-08-19 DIAGNOSIS — J849 Interstitial pulmonary disease, unspecified: Secondary | ICD-10-CM | POA: Diagnosis not present

## 2019-08-19 DIAGNOSIS — R0603 Acute respiratory distress: Secondary | ICD-10-CM | POA: Diagnosis not present

## 2019-08-19 DIAGNOSIS — E876 Hypokalemia: Secondary | ICD-10-CM | POA: Diagnosis present

## 2019-08-19 DIAGNOSIS — J9600 Acute respiratory failure, unspecified whether with hypoxia or hypercapnia: Secondary | ICD-10-CM | POA: Diagnosis present

## 2019-08-19 DIAGNOSIS — Z7189 Other specified counseling: Secondary | ICD-10-CM | POA: Diagnosis not present

## 2019-08-19 DIAGNOSIS — R5381 Other malaise: Secondary | ICD-10-CM | POA: Diagnosis not present

## 2019-08-19 DIAGNOSIS — R0600 Dyspnea, unspecified: Secondary | ICD-10-CM | POA: Insufficient documentation

## 2019-08-19 DIAGNOSIS — R531 Weakness: Secondary | ICD-10-CM

## 2019-08-19 DIAGNOSIS — R0609 Other forms of dyspnea: Secondary | ICD-10-CM

## 2019-08-19 DIAGNOSIS — J189 Pneumonia, unspecified organism: Secondary | ICD-10-CM | POA: Diagnosis not present

## 2019-08-19 DIAGNOSIS — J181 Lobar pneumonia, unspecified organism: Secondary | ICD-10-CM | POA: Diagnosis not present

## 2019-08-19 DIAGNOSIS — I82459 Acute embolism and thrombosis of unspecified peroneal vein: Secondary | ICD-10-CM | POA: Diagnosis present

## 2019-08-19 DIAGNOSIS — Z79899 Other long term (current) drug therapy: Secondary | ICD-10-CM | POA: Diagnosis not present

## 2019-08-19 DIAGNOSIS — E860 Dehydration: Secondary | ICD-10-CM | POA: Diagnosis present

## 2019-08-19 DIAGNOSIS — Z8616 Personal history of COVID-19: Secondary | ICD-10-CM | POA: Diagnosis not present

## 2019-08-19 DIAGNOSIS — R279 Unspecified lack of coordination: Secondary | ICD-10-CM | POA: Diagnosis not present

## 2019-08-19 DIAGNOSIS — Z9981 Dependence on supplemental oxygen: Secondary | ICD-10-CM | POA: Diagnosis not present

## 2019-08-19 DIAGNOSIS — R0689 Other abnormalities of breathing: Secondary | ICD-10-CM | POA: Diagnosis not present

## 2019-08-19 DIAGNOSIS — K219 Gastro-esophageal reflux disease without esophagitis: Secondary | ICD-10-CM | POA: Diagnosis present

## 2019-08-19 DIAGNOSIS — I82401 Acute embolism and thrombosis of unspecified deep veins of right lower extremity: Secondary | ICD-10-CM | POA: Diagnosis present

## 2019-08-19 DIAGNOSIS — Z7401 Bed confinement status: Secondary | ICD-10-CM | POA: Diagnosis not present

## 2019-08-19 DIAGNOSIS — Z87891 Personal history of nicotine dependence: Secondary | ICD-10-CM | POA: Diagnosis not present

## 2019-08-19 DIAGNOSIS — Z825 Family history of asthma and other chronic lower respiratory diseases: Secondary | ICD-10-CM | POA: Diagnosis not present

## 2019-08-19 DIAGNOSIS — M2550 Pain in unspecified joint: Secondary | ICD-10-CM | POA: Diagnosis not present

## 2019-08-19 DIAGNOSIS — J939 Pneumothorax, unspecified: Secondary | ICD-10-CM | POA: Diagnosis present

## 2019-08-19 DIAGNOSIS — J45909 Unspecified asthma, uncomplicated: Secondary | ICD-10-CM | POA: Diagnosis not present

## 2019-08-19 DIAGNOSIS — Z6824 Body mass index (BMI) 24.0-24.9, adult: Secondary | ICD-10-CM | POA: Diagnosis not present

## 2019-08-19 DIAGNOSIS — M810 Age-related osteoporosis without current pathological fracture: Secondary | ICD-10-CM | POA: Diagnosis present

## 2019-08-19 DIAGNOSIS — U071 COVID-19: Secondary | ICD-10-CM | POA: Diagnosis present

## 2019-08-19 DIAGNOSIS — J84112 Idiopathic pulmonary fibrosis: Secondary | ICD-10-CM | POA: Diagnosis not present

## 2019-08-19 DIAGNOSIS — Z66 Do not resuscitate: Secondary | ICD-10-CM | POA: Diagnosis present

## 2019-08-19 DIAGNOSIS — R0902 Hypoxemia: Secondary | ICD-10-CM | POA: Diagnosis not present

## 2019-08-19 DIAGNOSIS — J9601 Acute respiratory failure with hypoxia: Secondary | ICD-10-CM | POA: Diagnosis not present

## 2019-08-19 DIAGNOSIS — J8410 Pulmonary fibrosis, unspecified: Secondary | ICD-10-CM | POA: Diagnosis present

## 2019-08-19 DIAGNOSIS — D849 Immunodeficiency, unspecified: Secondary | ICD-10-CM | POA: Diagnosis present

## 2019-08-19 DIAGNOSIS — R0602 Shortness of breath: Secondary | ICD-10-CM | POA: Diagnosis not present

## 2019-08-19 DIAGNOSIS — J9611 Chronic respiratory failure with hypoxia: Secondary | ICD-10-CM | POA: Diagnosis present

## 2019-08-19 DIAGNOSIS — F419 Anxiety disorder, unspecified: Secondary | ICD-10-CM | POA: Diagnosis present

## 2019-08-19 DIAGNOSIS — M7989 Other specified soft tissue disorders: Secondary | ICD-10-CM | POA: Diagnosis not present

## 2019-08-19 DIAGNOSIS — Z743 Need for continuous supervision: Secondary | ICD-10-CM | POA: Diagnosis not present

## 2019-08-19 DIAGNOSIS — Z515 Encounter for palliative care: Secondary | ICD-10-CM | POA: Diagnosis not present

## 2019-08-19 DIAGNOSIS — J1282 Pneumonia due to coronavirus disease 2019: Secondary | ICD-10-CM | POA: Diagnosis present

## 2019-08-19 DIAGNOSIS — J9621 Acute and chronic respiratory failure with hypoxia: Secondary | ICD-10-CM | POA: Diagnosis present

## 2019-08-19 LAB — CBC WITH DIFFERENTIAL/PLATELET
Abs Immature Granulocytes: 0.03 10*3/uL (ref 0.00–0.07)
Basophils Absolute: 0 10*3/uL (ref 0.0–0.1)
Basophils Relative: 0 %
Eosinophils Absolute: 0 10*3/uL (ref 0.0–0.5)
Eosinophils Relative: 0 %
HCT: 41.3 % (ref 36.0–46.0)
Hemoglobin: 12.6 g/dL (ref 12.0–15.0)
Immature Granulocytes: 0 %
Lymphocytes Relative: 11 %
Lymphs Abs: 0.8 10*3/uL (ref 0.7–4.0)
MCH: 23.6 pg — ABNORMAL LOW (ref 26.0–34.0)
MCHC: 30.5 g/dL (ref 30.0–36.0)
MCV: 77.2 fL — ABNORMAL LOW (ref 80.0–100.0)
Monocytes Absolute: 0.4 10*3/uL (ref 0.1–1.0)
Monocytes Relative: 6 %
Neutro Abs: 5.7 10*3/uL (ref 1.7–7.7)
Neutrophils Relative %: 83 %
Platelets: 260 10*3/uL (ref 150–400)
RBC: 5.35 MIL/uL — ABNORMAL HIGH (ref 3.87–5.11)
RDW: 18.6 % — ABNORMAL HIGH (ref 11.5–15.5)
WBC: 7 10*3/uL (ref 4.0–10.5)
nRBC: 0 % (ref 0.0–0.2)

## 2019-08-19 LAB — HEPATIC FUNCTION PANEL
ALT: 44 U/L (ref 0–44)
AST: 45 U/L — ABNORMAL HIGH (ref 15–41)
Albumin: 3.5 g/dL (ref 3.5–5.0)
Alkaline Phosphatase: 48 U/L (ref 38–126)
Bilirubin, Direct: 0.1 mg/dL (ref 0.0–0.2)
Indirect Bilirubin: 0.7 mg/dL (ref 0.3–0.9)
Total Bilirubin: 0.8 mg/dL (ref 0.3–1.2)
Total Protein: 6.7 g/dL (ref 6.5–8.1)

## 2019-08-19 LAB — BASIC METABOLIC PANEL
Anion gap: 10 (ref 5–15)
BUN: 14 mg/dL (ref 8–23)
CO2: 26 mmol/L (ref 22–32)
Calcium: 9.1 mg/dL (ref 8.9–10.3)
Chloride: 102 mmol/L (ref 98–111)
Creatinine, Ser: 0.68 mg/dL (ref 0.44–1.00)
GFR calc Af Amer: >60 mL/min (ref >60)
GFR calc non Af Amer: >60 mL/min (ref >60)
Glucose, Bld: 111 mg/dL — ABNORMAL HIGH (ref 70–99)
Potassium: 3.7 mmol/L (ref 3.5–5.1)
Sodium: 138 mmol/L (ref 135–145)

## 2019-08-19 LAB — TROPONIN I (HIGH SENSITIVITY)
Troponin I (High Sensitivity): 12 ng/L (ref <18)
Troponin I (High Sensitivity): 11 ng/L (ref <18)

## 2019-08-19 LAB — POC SARS CORONAVIRUS 2 AG -  ED: SARS Coronavirus 2 Ag: POSITIVE — AB

## 2019-08-19 LAB — BRAIN NATRIURETIC PEPTIDE: B Natriuretic Peptide: 32.6 pg/mL (ref 0.0–100.0)

## 2019-08-19 MED ORDER — ONDANSETRON HCL 4 MG/2ML IJ SOLN
4.0000 mg | Freq: Once | INTRAMUSCULAR | Status: AC
  Administered 2019-08-19: 4 mg via INTRAVENOUS
  Filled 2019-08-19: qty 2

## 2019-08-19 MED ORDER — SODIUM CHLORIDE 0.9 % IV BOLUS
1000.0000 mL | Freq: Once | INTRAVENOUS | Status: AC
  Administered 2019-08-19: 1000 mL via INTRAVENOUS

## 2019-08-19 MED ORDER — ACETAMINOPHEN 325 MG PO TABS
650.0000 mg | ORAL_TABLET | Freq: Once | ORAL | Status: AC
  Administered 2019-08-19: 20:00:00 650 mg via ORAL
  Filled 2019-08-19: qty 2

## 2019-08-19 NOTE — ED Triage Notes (Signed)
The patient is from home and complains of SOB for 3 week. This is since she was recently discharged for pneumonia. She is chronically on 4L nasal cannula the patient has desaturations with activity, but at rest is at 100%. She has also reported malaise and loss of appetite. The patient also took 1300 of tylenol at 1300 for generalized pain.  HX. Pulmonary fibrosis   EMS vitals: 124/76 BP 66 HR 100% O2 sat on 4L nasal cannula 18 Resp Rate

## 2019-08-19 NOTE — ED Provider Notes (Signed)
Care assumed from [facility], [place], at shift change, please see their notes for full documentation of patient's complaint/HPI. Briefly, pt here with increasing SOB x 3 weeks, however worse in the past week with subjective fevers, chills, nausea, and emesis. Results so far show CXR with stable ILD. Awaiting labwork. Plan is to dispo accordingly. If AKI needs to be admitted. If desatting with ambulation on chronic 4L needs to be admitted.   Physical Exam  BP 125/80 (BP Location: Right Arm)   Pulse 69   Temp 98.2 F (36.8 C) (Oral)   Resp 20   Ht _0  (1.676 m)   Wt 68 kg   SpO2 99%   BMI 24.21 kg/m   Physical Exam Vitals and nursing note reviewed.  Constitutional:      Appearance: She is not ill-appearing.  HENT:     Head: Normocephalic and atraumatic.  Eyes:     Conjunctiva/sclera: Conjunctivae normal.  Cardiovascular:     Rate and Rhythm: Normal rate and regular rhythm.  Pulmonary:     Effort: Pulmonary effort is normal.     Breath sounds: Rales present. No wheezing or rhonchi.     Comments: Satting 100% on 4L Skin:    General: Skin is warm and dry.     Coloration: Skin is not jaundiced.  Neurological:     Mental Status: She is alert.     ED Course/Procedures   Clinical Course as of Aug 18 1612  Thu Aug 19, 2019  1613 SARS Coronavirus 2 Ag(!): POSITIVE [MV]    Clinical Course User Index [MV] [name], PA-C    Procedures  Results for orders placed or performed during the hospital encounter of 79/02/40  Basic metabolic panel  Result Value Ref Range   Sodium 138 135 - 145 mmol/L   Potassium 3.7 3.5 - 5.1 mmol/L   Chloride 102 98 - 111 mmol/L   CO2 26 22 - 32 mmol/L   Glucose, Bld 111 (H) 70 - 99 mg/dL   BUN 14 8 - 23 mg/dL   Creatinine, Ser 0.68 0.44 - 1.00 mg/dL   Calcium 9.1 8.9 - 10.3 mg/dL   GFR calc non Af Amer >60 >60 mL/min   GFR calc Af Amer >60 >60 mL/min   Anion gap 10 5 - 15  Brain natriuretic peptide  Result Value Ref Range   B  Natriuretic Peptide 32.6 0.0 - 100.0 pg/mL  CBC with Differential  Result Value Ref Range   WBC 7.0 4.0 - 10.5 K/uL   RBC 5.35 (H) 3.87 - 5.11 MIL/uL   Hemoglobin 12.6 12.0 - 15.0 g/dL   HCT 41.3 36.0 - 46.0 %   MCV 77.2 (L) 80.0 - 100.0 fL   MCH 23.6 (L) 26.0 - 34.0 pg   MCHC 30.5 30.0 - 36.0 g/dL   RDW 18.6 (H) 11.5 - 15.5 %   Platelets 260 150 - 400 K/uL   nRBC 0.0 0.0 - 0.2 %   Neutrophils Relative % 83 %   Neutro Abs 5.7 1.7 - 7.7 K/uL   Lymphocytes Relative 11 %   Lymphs Abs 0.8 0.7 - 4.0 K/uL   Monocytes Relative 6 %   Monocytes Absolute 0.4 0.1 - 1.0 K/uL   Eosinophils Relative 0 %   Eosinophils Absolute 0.0 0.0 - 0.5 K/uL   Basophils Relative 0 %   Basophils Absolute 0.0 0.0 - 0.1 K/uL   Immature Granulocytes 0 %   Abs Immature Granulocytes 0.03 0.00 - 0.07  K/uL  Hepatic function panel  Result Value Ref Range   Total Protein 6.7 6.5 - 8.1 g/dL   Albumin 3.5 3.5 - 5.0 g/dL   AST 45 (H) 15 - 41 U/L   ALT 44 0 - 44 U/L   Alkaline Phosphatase 48 38 - 126 U/L   Total Bilirubin 0.8 0.3 - 1.2 mg/dL   Bilirubin, Direct 0.1 0.0 - 0.2 mg/dL   Indirect Bilirubin 0.7 0.3 - 0.9 mg/dL  POC SARS Coronavirus 2 Ag-ED - Nasal Swab (BD Veritor Kit)  Result Value Ref Range   SARS Coronavirus 2 Ag POSITIVE (A) NEGATIVE  Troponin I (High Sensitivity)  Result Value Ref Range   Troponin I (High Sensitivity) 12 <18 ng/L    MDM  Rapid covid test positive.  CBC without leukocytosis. Hgb stable. Mild elevation in RBCs consistent with dehydration.  BMP without electrolyte abnormalities. Creatinine stable 0.68. LFTs within normal limits.  Initial trop 12; will repeat.  BNP 32.6  Pt was ambulated in the room with 4L on; saturations remained above 93%. However pt was ambulated on RA per her request and she desatted to 77%. Pt is on chronic 4L at home at baseline; she should continue wearing this at home.   Awaiting repeat trop. Have discussed with patient her COVID 19 positive status.  She states she feels like she would rather go home than be admitted. Given she does not desat with her 4L O2 and she is well appearing I think this is appropriate. Discussed case with Dr. Lacinda Axon Attending [place] who agrees with plan. I have placed call to [place] at [facility] and left a voicemail to hopefully get patient in to see them. She is advised to follow up with her PCP and pulmonologist regarding her COVID 19 positive status and to remain at home for 14 days starting today. She is in agreement with plan however agrees to return to the ED immediately for any worsening of her condition including worsening SOB with her normal 4 L.       [name], PA-C 08/19/19 2231    Nat Christen, MD 08/23/19 [phone]

## 2019-08-19 NOTE — ED Notes (Signed)
Patient walked around the room with and without O2. While on her normal 4L of O2 the patient maintained O2 sats between 94-97% and tolerated it well. Without O2 the patient's O2 sat dropped to 74% and was very SOB.

## 2019-08-19 NOTE — Discharge Instructions (Signed)
You have tested POSITIVE for COVID 19 today. Please stay home and self isolate for 14 days. Cleared: 09/03/2019.  Follow up with  your PCP as well as Pulmonology regarding your ED Visit today Wear your oxygen at all times  Return to the ED IMMEDIATELY for any worsening symptoms including worsening shortness of breath, having to increase your oxygen at home and feeling short of breath despite this, chest pain, leg swelling, or any other concerning symptoms  I have placed a referral to the [facility] - they should be calling you soon.

## 2019-08-19 NOTE — ED Provider Notes (Signed)
[facility] DEPT Provider Note   CSN: [id number] Arrival date & time: 08/19/19  1334     History Chief Complaint  Patient presents with  . Shortness of Breath    Stacy [name] is a 64 y.o. female with history of GERD, pulmonary fibrosis, interstitial lung disease, osteoporosis presents for evaluation of acute onset, progressively worsening shortness of breath for 3 weeks.  She is on 4 L supplemental oxygen chronically but reports that with exertion or ambulation she feels particularly dyspneic over the last week.  Has had subjective fevers and chills and daughter told her yesterday that she was diaphoretic.  She denies any chest pains, abdominal pain.  She has had nausea and a couple episodes of nonbloody nonbilious emesis in the last 2 days.  She reports feeling generally weak and has had some anorexia.  Denies urinary symptoms, diarrhea, constipation.  She has been increasing her home O2 to 6 L/min with improvement. Of note, patient was recently admitted to the hospital from 07/20/2019 to 07/25/2019 for pneumonia.  She reports that she completed outpatient course of antibiotics and steroids.  No known sick contacts.  She is a former smoker, quit several decades ago.  The history is provided by the patient.       Past Medical History:  Diagnosis Date  . GERD (gastroesophageal reflux disease)   . Pneumonia   . Pulmonary fibrosis [facility])     Patient Active Problem List   Diagnosis Date Noted  . Acute on chronic respiratory failure with hypercapnia ([place]) 07/25/2019  . Community acquired pneumonia 07/20/2019  . Cough 12/16/2018  . Pulmonary fibrosis ([place]) 04/06/2018  . ILD (interstitial lung disease) ([place]) 04/06/2018  . Gastroesophageal reflux disease 04/06/2018  . Osteoporosis 04/06/2018    Past Surgical History:  Procedure Laterality Date  . ANKLE SURGERY Left    fx, has plates  . COLONOSCOPY    . ESOPHAGOGASTRODUODENOSCOPY    . HEMORRHOID  SURGERY    . LUNG BIOPSY    . TUBAL LIGATION       OB History   No obstetric history on file.     Family History  Problem Relation Age of Onset  . Diabetes Mother   . Cancer Father        unsure of primary source, farmer  . Asthma Brother     Social History   Tobacco Use  . Smoking status: Former Smoker    Packs/day: 2.00    Types: Cigarettes    Start date: 07/1967    Quit date: 07/1993    Years since quitting: 26.1  . Smokeless tobacco: Never Used  Substance Use Topics  . Alcohol use: Yes    Comment: rarely  . Drug use: Never    Home Medications Prior to Admission medications   Medication Sig Start Date End Date Taking? Authorizing Provider  acetaminophen (TYLENOL) 500 MG tablet Take 500 mg by mouth 2 (two) times a day. Take 3 tablets twice daily    [provider]  azaTHIOprine (IMURAN) 50 MG tablet TAKE 3 TABLETS EVERY DAY Patient taking differently: Take 50 mg by mouth 3 (three) times daily.  07/12/19   Mannam, Hart Robinsons, MD  chlorpheniramine-HYDROcodone (TUSSIONEX) 10-8 MG/5ML SUER Take 5 mLs by mouth every 12 (twelve) hours as needed for cough. 07/27/19   Candee Furbish, MD  dextromethorphan-guaiFENesin [facility] DM) 30-600 MG 12hr tablet Take 1 tablet by mouth 2 (two) times daily as needed for cough. 07/25/19   Debbe Odea, MD  ESBRIET 267 MG TABS TAKE 3 TABLETS (801MG) BY MOUTH THREE TIMES A DAY 07/22/19   Mannam, Hart Robinsons, MD  Multiple Vitamins-Minerals (CENTRUM SILVER) tablet Take by mouth.    [provider]  omeprazole (PRILOSEC) 20 MG capsule Take 1 capsule (20 mg total) by mouth daily. 12/28/18   Marrian Salvage, [name]  OXYGEN Inhale into the lungs. 3 liters per minute, uses when up and moving around    [provider]  predniSONE (DELTASONE) 10 MG tablet Take 10 mg by mouth daily with breakfast. 40 mg daily x 7 days 20 mg daily x 7 days 10 mg daily x 7 days 5 mg daily    [provider]  simvastatin (ZOCOR) 20 MG  tablet TAKE 1 TABLET DAILY AT 6PM (NO FURTHER REFILLS WITHOUT OFFICE VISIT) 07/08/19   Biagio Borg, MD  sulfamethoxazole-trimethoprim (BACTRIM DS) 800-160 MG tablet Take 1 tablet by mouth 3 (three) times a week. TAKE 1 TABLET EVERY DAY ON MONDAY, WEDNESDAY AND FRIDAY 01/22/19   Mannam, Hart Robinsons, MD  traZODone (DESYREL) 100 MG tablet TAKE 1 TABLET (100 MG TOTAL) BY MOUTH AT BEDTIME AS NEEDED FOR SLEEP (NO FURTHER REFILLS WITHOUT OFFICE VISIT) Patient taking differently: Take 100 mg by mouth at bedtime as needed for sleep.  07/08/19   Biagio Borg, MD    Allergies    Patient has no known allergies.  Review of Systems   Review of Systems  Constitutional: Positive for chills, fatigue and fever.  Respiratory: Positive for cough and shortness of breath.   Cardiovascular: Negative for chest pain.  Gastrointestinal: Positive for nausea and vomiting. Negative for abdominal pain, constipation and diarrhea.  All other systems reviewed and are negative.   Physical Exam Updated Vital Signs BP 125/80 (BP Location: Right Arm)   Pulse 69   Temp 98.2 F (36.8 C) (Oral)   Resp 20   Ht _0  (1.676 m)   Wt 68 kg   SpO2 99%   BMI 24.21 kg/m   Physical Exam Vitals and nursing note reviewed.  Constitutional:      General: She is not in acute distress.    Appearance: She is well-developed.  HENT:     Head: Normocephalic and atraumatic.  Eyes:     General:        Right eye: No discharge.        Left eye: No discharge.     Conjunctiva/sclera: Conjunctivae normal.  Neck:     Vascular: No JVD.     Trachea: No tracheal deviation.  Cardiovascular:     Rate and Rhythm: Normal rate and regular rhythm.     Pulses: Normal pulses.     Comments: 2+ radial and DP/PT pulses bilaterally, Homans sign absent bilaterally, no lower extremity edema, no palpable cords, compartments are soft  Pulmonary:     Comments: Mildly tachypneic but speaking in full sentences at rest at 4 L supplemental oxygen via  nasal cannula.  Diffuse Rales, worse in the bilateral lung bases. Chest:     Chest wall: No tenderness.  Abdominal:     General: Bowel sounds are normal. There is no distension.     Palpations: Abdomen is soft.     Tenderness: There is no abdominal tenderness. There is no guarding.  Musculoskeletal:     Cervical back: Normal range of motion and neck supple.     Right lower leg: No edema.     Left lower leg: No edema.  Skin:  General: Skin is warm and dry.     Findings: No erythema.  Neurological:     Mental Status: She is alert.  Psychiatric:        Behavior: Behavior normal.     ED Results / Procedures / Treatments   Labs (all labs ordered are listed, but only abnormal results are displayed) Labs Reviewed  BASIC METABOLIC PANEL  BRAIN NATRIURETIC PEPTIDE  CBC WITH DIFFERENTIAL/PLATELET  HEPATIC FUNCTION PANEL  POC SARS CORONAVIRUS 2 AG -  ED  TROPONIN I (HIGH SENSITIVITY)    EKG EKG Interpretation  Date/Time:  Thursday August 19 2019 13:56:02 EST Ventricular Rate:  61 PR Interval:    QRS Duration: 135 QT Interval:  432 QTC Calculation: 436 R Axis:   -93 Text Interpretation: Sinus rhythm RBBB and LAFB No significant change since last tracing Confirmed by Davonna Belling [phone]) on 08/19/2019 2:07:34 PM   Radiology DG Chest Port 1 View  Result Date: 08/19/2019 CLINICAL DATA:  Shortness of breath. EXAM: PORTABLE CHEST 1 VIEW COMPARISON:  July 20, 2019. FINDINGS: Stable cardiomegaly. No pneumothorax or pleural effusion is noted. Stable bilateral interstitial lung opacities are noted most consistent with pulmonary fibrosis or scarring. Left upper lobe pneumonia on prior exam has resolved. Bony thorax is unremarkable. No definite acute abnormality is noted. IMPRESSION: Stable bilateral interstitial lung opacities are noted most consistent with pulmonary fibrosis or scarring. Electronically Signed   By: Marijo Conception M.D.   On: 08/19/2019 14:42     Procedures Procedures (including critical care time)  Medications Ordered in ED Medications  ondansetron (ZOFRAN) injection 4 mg (4 mg Intravenous Given 08/19/19 1525)  sodium chloride 0.9 % bolus 1,000 mL (1,000 mLs Intravenous New Bag/Given 08/19/19 1526)    ED Course  I have reviewed the triage vital signs and the nursing notes.  Pertinent labs & imaging results that were available during my care of the patient were reviewed by me and considered in my medical decision making (see chart for details).    MDM Rules/Calculators/A&P                      Lillybeth Tal was evaluated in Emergency Department on 08/19/2019 for the symptoms described in the history of present illness. She was evaluated in the context of the global COVID-19 pandemic, which necessitated consideration that the patient might be at risk for infection with the SARS-CoV-2 virus that causes COVID-19. Institutional protocols and algorithms that pertain to the evaluation of patients at risk for COVID-19 are in a state of rapid change based on information released by regulatory bodies including the CDC and federal and state organizations. These policies and algorithms were followed during the patient's care in the ED.  Patient with history of pulmonary fibrosis presents to the ED for evaluation of 1 week of progressively worsening dyspnea on exertion, generalized weakness, 2 days of nausea and vomiting.  She is afebrile, vital signs are stable at rest.  He is mildly tachypneic but maintaining O2 saturations on her typical 4 L supplemental oxygen via nasal cannula.  We will obtain chest x-ray, EKG, lab work and point-of-care Covid test and reassess.  We will also give IV fluids and Zofran and attempt a p.o. challenge.  3:35 PM Patient signed out to oncoming provider PA Alroy Bailiff.  EKG shows no acute ischemic abnormalities, no significant changes from last tracing.  Chest x-ray shows stable changes consistent with pulmonary  fibrosis.  Lab work and [employer] test are pending.  If she has any evidence of electrolyte derangement, AKI, ACS/MI, pneumonia or heart failure she may require admission to the hospital for further evaluation or management.  If her O2 saturations drop with ambulation on her typical oxygen requirement she will also require admission.  Alternatively if her work-up is reassuring and she is tolerating p.o. food and fluids and feels better she will likely be stable for discharge home with close follow-up with her pulmonologist and PCP.    Final Clinical Impression(s) / ED Diagnoses Final diagnoses:  DOE (dyspnea on exertion)  Generalized weakness    Rx / DC Orders ED Discharge Orders    None       Debroah Baller 08/19/19 1537    Davonna Belling, MD 08/20/19 [phone]

## 2019-08-20 ENCOUNTER — Telehealth (INDEPENDENT_AMBULATORY_CARE_PROVIDER_SITE_OTHER): Payer: Medicare HMO | Admitting: Adult Health

## 2019-08-20 ENCOUNTER — Telehealth: Payer: Self-pay | Admitting: Nurse Practitioner

## 2019-08-20 ENCOUNTER — Telehealth: Payer: Self-pay | Admitting: Pulmonary Disease

## 2019-08-20 ENCOUNTER — Encounter: Payer: Self-pay | Admitting: Adult Health

## 2019-08-20 ENCOUNTER — Other Ambulatory Visit: Payer: Self-pay | Admitting: Adult Health

## 2019-08-20 DIAGNOSIS — U071 COVID-19: Secondary | ICD-10-CM

## 2019-08-20 DIAGNOSIS — J9611 Chronic respiratory failure with hypoxia: Secondary | ICD-10-CM

## 2019-08-20 DIAGNOSIS — J8410 Pulmonary fibrosis, unspecified: Secondary | ICD-10-CM | POA: Diagnosis not present

## 2019-08-20 DIAGNOSIS — J449 Chronic obstructive pulmonary disease, unspecified: Secondary | ICD-10-CM | POA: Diagnosis not present

## 2019-08-20 NOTE — Progress Notes (Signed)
  I connected by phone with Stacy [name] on 08/20/2019 at 4:07 PM to discuss the potential use of an new treatment for mild to moderate COVID-19 viral infection in non-hospitalized patients.  This patient is a 64 y.o. female that meets the FDA criteria for Emergency Use Authorization of bamlanivimab or casirivimab\imdevimab.  Has a (+) direct SARS-CoV-2 viral test result  Has mild or moderate COVID-19   Is ? 66 years of age and weighs ? 40 kg  Is NOT hospitalized due to COVID-19  Is NOT requiring oxygen therapy or requiring an increase in baseline oxygen flow rate due to COVID-19  Is within 10 days of symptom onset  Has at least one of the high risk factor(s) for progression to severe COVID-19 and/or hospitalization as defined in EUA.  Specific high risk criteria : >/= 66 yo   I have spoken and communicated the following to the patient or parent/caregiver:  1. FDA has authorized the emergency use of bamlanivimab and casirivimab\imdevimab for the treatment of mild to moderate COVID-19 in adults and pediatric patients with positive results of direct SARS-CoV-2 viral testing who are 54 years of age and older weighing at least 40 kg, and who are at high risk for progressing to severe COVID-19 and/or hospitalization.  2. The significant known and potential risks and benefits of bamlanivimab and casirivimab\imdevimab, and the extent to which such potential risks and benefits are unknown.  3. Information on available alternative treatments and the risks and benefits of those alternatives, including clinical trials.  4. Patients treated with bamlanivimab and casirivimab\imdevimab should continue to self-isolate and use infection control measures (e.g., wear mask, isolate, social distance, avoid sharing personal items, clean and disinfect "high touch" surfaces, and frequent handwashing) according to CDC guidelines.   5. The patient or parent/caregiver has the option to accept or refuse  bamlanivimab or casirivimab\imdevimab .  After reviewing this information with the patient, The patient agreed to proceed with receiving the casirivimab\imdevimab infusion and will be provided a copy of the Fact sheet prior to receiving the infusion.  Scheduled 08/21/19 at 1030 .  Marland Kitchen Stacy [name] 08/20/2019 4:07 PM

## 2019-08-20 NOTE — Telephone Encounter (Signed)
08/20/2019  Received message from Lazaro Arms, NP who contacted the patient for the monoclonal antibody infusion.  Shared concerns regarding the patient's recent emergency room visit.  Unfortunately patient is too far out to receive the monoclonal antibody infusion based off her symptoms.  Patient was tested yesterday on 08/19/2019 for SARS-CoV-2 testing positive.  Baseline has pulmonary fibrosis.  Last seen in our office by Dr. Tamala Julian.  High risk of readmission to the hospital.  Patient needs virtual visit follow-up with APP or Dr. Tamala Julian to review emergency room visit as well as monitor status.  Ideally this should be scheduled for 08/20/2019 if patient is able to accommodate.  If not either on Monday or Tuesday of next week.  This needs to be a virtual visit as she tested positive for Covid yesterday.  We will route to triage to get this scheduled quickly.  Will route to Dr. Tamala Julian as Juluis Rainier.  Wyn Quaker, [name]

## 2019-08-20 NOTE — Telephone Encounter (Signed)
Called to Discuss with patient about Covid symptoms and the use of bamlanivimab, a monoclonal antibody infusion for those with mild to moderate Covid symptoms and at a high risk of hospitalization.     Pt states that symptoms started around 3 weeks ago. Will try to get her a follow up with pulmonary scheduled for today.

## 2019-08-20 NOTE — Telephone Encounter (Signed)
Called spoke with patient, MyChart visit scheduled with Tammy NP for this afternoon at 1530.  Patient will need link texted to her.    Patient reported she is doing 'okay' today, is not in any distress on the phone.  Advised patient that if her symptoms change or worsen prior to MyChart visit, to contact the office immediately for recommendations.  Nothing further needed at this time; will sign and route to TP to make her aware.

## 2019-08-20 NOTE — Patient Instructions (Addendum)
Please hold Imuran for 4 weeks. Continue on prednisone 5 mg daily Continue on Esbriet Continue on oxygen 4 L,, O2 saturation goals greater than 90%. We have set you up for a monoclonal antibody infusion at [facility] August 21, 2019  at [place] at [address] [place], [place]. Please continue to quarantine at home for a total of 14 days. Follow-up for video visit on February 15 my nurse will call you with appointment If your symptoms worsen with increased shortness of breath, low oxygen levels below 90%, high fevers or inability to eat or drink please seek emergency room care Please contact office for sooner follow up if symptoms do not improve or worsen or seek emergency care

## 2019-08-20 NOTE — Progress Notes (Signed)
Virtual Visit via Video Note  I connected with Stacy [name] on 08/20/19 at  3:30 PM EST by a video enabled telemedicine application and verified that I am speaking with the correct person using two identifiers.  Location: Patient: Home  Provider: office    I discussed the limitations of evaluation and management by telemedicine and the availability of in person appointments. The patient expressed understanding and agreed to proceed.  History of Present Illness: 64 year old female former smoker followed for interstitial lung disease with UIP (VATS biopsy 2003).  Patient is on maintenance regimen of Imuran 150 mg daily, Esbriet 3 times daily and Bactrim 3 days weekly.  Along with prednisone 5 mg.  She has chronic respiratory failure on oxygen at home at 4 L.   Today's televisit is for an acute office visit for COVID-19   Patient was recently hospitalized for pneumonia and acute ILD exacerbation.  She was treated with antibiotics with Levaquin and a steroid burst.  Since last visit patient she was feeling better until days ago. Chest x-ray August 19, 2019 showed stable interstitial lung opacities consistent with pulmonary fibrosis.  Left upper lobe pneumonia has resolved.  Patient is on chronic oxygen at 4 L.  Has not seen any increased oxygen demands. She is on a maintenance regimen for interstitial lung disease with Esbriet, Imuran and chronic steroids at prednisone 5 mg.  She is on prophylactic Bactrim 3 days a week. She went to the emergency room yesterday found to have positive COVID-19.  Chest x-ray as above showed resolved pneumonia and stable ILD changes.  O2 saturations adequate on 4 L which is her baseline oxygen with no increased oxygen demands.  Patient is calling and as she is worried that her symptoms will worsen as she has multiple medical problems and chronic lung disease.  She says she has had some body aches and fevers.  She is had no fever today.  She has general malaise.   Appetite is good.  No nausea vomiting or diarrhea.  No increased oxygen demands today.  Patient Active Problem List   Diagnosis Date Noted  . Acute on chronic respiratory failure with hypercapnia ([place]) 07/25/2019  . Community acquired pneumonia 07/20/2019  . Cough 12/16/2018  . Pulmonary fibrosis ([place]) 04/06/2018  . ILD (interstitial lung disease) ([place]) 04/06/2018  . Gastroesophageal reflux disease 04/06/2018  . Osteoporosis 04/06/2018   Current Outpatient Medications on File Prior to Visit  Medication Sig Dispense Refill  . acetaminophen (TYLENOL) 500 MG tablet Take 1,000 mg by mouth every 6 (six) hours as needed for moderate pain. Take 3 tablets twice daily     . azaTHIOprine (IMURAN) 50 MG tablet TAKE 3 TABLETS EVERY DAY (Patient taking differently: Take 50 mg by mouth 3 (three) times daily. ) 270 tablet 0  . chlorpheniramine-HYDROcodone (TUSSIONEX) 10-8 MG/5ML SUER Take 5 mLs by mouth every 12 (twelve) hours as needed for cough. 120 mL 0  . dextromethorphan-guaiFENesin (MUCINEX DM) 30-600 MG 12hr tablet Take 1 tablet by mouth 2 (two) times daily as needed for cough. 60 tablet 0  . ESBRIET 267 MG TABS TAKE 3 TABLETS (801MG) BY MOUTH THREE TIMES A DAY (Patient taking differently: Take 801 mg by mouth 3 (three) times daily. ) 252 tablet 3  . Multiple Vitamins-Minerals (CENTRUM SILVER) tablet Take 1 tablet by mouth daily.     Marland Kitchen omeprazole (PRILOSEC) 20 MG capsule Take 1 capsule (20 mg total) by mouth daily. 90 capsule 3  . OXYGEN Inhale into the lungs.  3 liters per minute, uses when up and moving around    . predniSONE (DELTASONE) 5 MG tablet Take 5 mg by mouth daily with breakfast.    . simvastatin (ZOCOR) 20 MG tablet TAKE 1 TABLET DAILY AT 6PM (NO FURTHER REFILLS WITHOUT OFFICE VISIT) 90 tablet 1  . sulfamethoxazole-trimethoprim (BACTRIM DS) 800-160 MG tablet Take 1 tablet by mouth 3 (three) times a week. TAKE 1 TABLET EVERY DAY ON MONDAY, WEDNESDAY AND FRIDAY 39 tablet 3  . traZODone  (DESYREL) 100 MG tablet TAKE 1 TABLET (100 MG TOTAL) BY MOUTH AT BEDTIME AS NEEDED FOR SLEEP (NO FURTHER REFILLS WITHOUT OFFICE VISIT) 90 tablet 0   No current facility-administered medications on file prior to visit.     Observations/Objective: Patient appears stable with no increased work of breathing.  Does not appear to be in any distress on video visit.    Assessment and Plan: COVID-19 infection-patient has high risk for complications of [id number] as she has severe interstitial lung disease is oxygen dependent.  And is over age of 24. Have advised her to continue on supportive care.  She is a candidate for the monoclonal antibody infusion.  Discussed in detail the monoclonal antibody infusion along with patient education about treatment details.  Patient would like to proceed with this.  I have placed orders for this and she has been scheduled for tomorrow at the infusion center at 1030.  Patient is aware that if her symptoms worsen she has increased oxygen demands, hypoxemia fevers or inability to eat or drink.  She is to seek emergency room care as the monoclonal antibody is only for mild to moderate symptoms and no increased oxygen demands.  Patient is aware to wait 90 days to receive her Covid vaccine after her monoclonal antibody infusion  Interstitial lung disease-currently appears to be stable she is continue her maintenance regimen with  prednisone Esbriet and chronic steroids. Discussed with our ILD team ,Dr. Chase Caller , regarding her Imuran and will hold for the next 4 weeks while recovering from COVID-19    Chronic hypoxic respiratory failure-oxygen levels are stable with no increased demands.  Continue on 4 L to keep O2 saturations greater than 90%.  Community-acquired pneumonia recent treatment now resolved chest x-ray showed clearance   Plan   Patient Instructions  Please hold Imuran for 4 weeks. Continue on prednisone 5 mg daily Continue on Esbriet Continue on oxygen  4 L,, O2 saturation goals greater than 90%. We have set you up for a monoclonal antibody infusion at [facility] August 21, 2019  at [place] at [address] [place], [place]. Please continue to quarantine at home for a total of 14 days. Follow-up for video visit on February 15 my nurse will call you with appointment If your symptoms worsen with increased shortness of breath, low oxygen levels below 90%, high fevers or inability to eat or drink please seek emergency room care Please contact office for sooner follow up if symptoms do not improve or worsen or seek emergency care       Follow Up Instructions: Follow-up in 3 days and as needed Please contact office for sooner follow up if symptoms do not improve or worsen or seek emergency care     I discussed the assessment and treatment plan with the patient. The patient was provided an opportunity to ask questions and all were answered. The patient agreed with the plan and demonstrated an understanding of the instructions.  The patient was advised to call back or seek an in-person evaluation if the symptoms worsen or if the condition fails to improve as anticipated.  I provided  35  minutes of non-face-to-face time during this encounter.   Rexene Edison, NP

## 2019-08-21 ENCOUNTER — Inpatient Hospital Stay (HOSPITAL_COMMUNITY): Payer: Medicare HMO

## 2019-08-21 ENCOUNTER — Inpatient Hospital Stay (HOSPITAL_COMMUNITY)
Admission: EM | Admit: 2019-08-21 | Discharge: 2019-09-10 | DRG: 177 | Disposition: A | Discharge status: Hospice | Payer: Medicare HMO | Attending: Internal Medicine | Admitting: Internal Medicine

## 2019-08-21 ENCOUNTER — Emergency Department (HOSPITAL_COMMUNITY): Payer: Medicare HMO

## 2019-08-21 ENCOUNTER — Ambulatory Visit (HOSPITAL_COMMUNITY)
Admission: RE | Admit: 2019-08-21 | Discharge: 2019-08-21 | Disposition: A | Discharge status: Home / Self Care | Payer: Medicare HMO | Source: Ambulatory Visit | Attending: Pulmonary Disease | Admitting: Pulmonary Disease

## 2019-08-21 ENCOUNTER — Other Ambulatory Visit: Payer: Self-pay

## 2019-08-21 ENCOUNTER — Encounter (HOSPITAL_COMMUNITY): Payer: Self-pay

## 2019-08-21 DIAGNOSIS — E876 Hypokalemia: Secondary | ICD-10-CM

## 2019-08-21 DIAGNOSIS — I82401 Acute embolism and thrombosis of unspecified deep veins of right lower extremity: Secondary | ICD-10-CM | POA: Diagnosis present

## 2019-08-21 DIAGNOSIS — D849 Immunodeficiency, unspecified: Secondary | ICD-10-CM | POA: Diagnosis present

## 2019-08-21 DIAGNOSIS — Z6824 Body mass index (BMI) 24.0-24.9, adult: Secondary | ICD-10-CM | POA: Diagnosis not present

## 2019-08-21 DIAGNOSIS — J849 Interstitial pulmonary disease, unspecified: Secondary | ICD-10-CM | POA: Diagnosis present

## 2019-08-21 DIAGNOSIS — Z79899 Other long term (current) drug therapy: Secondary | ICD-10-CM | POA: Diagnosis not present

## 2019-08-21 DIAGNOSIS — F419 Anxiety disorder, unspecified: Secondary | ICD-10-CM | POA: Diagnosis present

## 2019-08-21 DIAGNOSIS — Z515 Encounter for palliative care: Secondary | ICD-10-CM | POA: Diagnosis not present

## 2019-08-21 DIAGNOSIS — I82459 Acute embolism and thrombosis of unspecified peroneal vein: Secondary | ICD-10-CM | POA: Diagnosis present

## 2019-08-21 DIAGNOSIS — J84112 Idiopathic pulmonary fibrosis: Secondary | ICD-10-CM | POA: Diagnosis not present

## 2019-08-21 DIAGNOSIS — J9611 Chronic respiratory failure with hypoxia: Secondary | ICD-10-CM | POA: Diagnosis present

## 2019-08-21 DIAGNOSIS — Z8616 Personal history of COVID-19: Secondary | ICD-10-CM | POA: Diagnosis not present

## 2019-08-21 DIAGNOSIS — Z87891 Personal history of nicotine dependence: Secondary | ICD-10-CM

## 2019-08-21 DIAGNOSIS — E860 Dehydration: Secondary | ICD-10-CM | POA: Diagnosis present

## 2019-08-21 DIAGNOSIS — U071 COVID-19: Principal | ICD-10-CM | POA: Diagnosis present

## 2019-08-21 DIAGNOSIS — Z66 Do not resuscitate: Secondary | ICD-10-CM | POA: Diagnosis present

## 2019-08-21 DIAGNOSIS — J939 Pneumothorax, unspecified: Secondary | ICD-10-CM | POA: Diagnosis present

## 2019-08-21 DIAGNOSIS — J9601 Acute respiratory failure with hypoxia: Secondary | ICD-10-CM | POA: Diagnosis not present

## 2019-08-21 DIAGNOSIS — J1282 Pneumonia due to coronavirus disease 2019: Secondary | ICD-10-CM

## 2019-08-21 DIAGNOSIS — M810 Age-related osteoporosis without current pathological fracture: Secondary | ICD-10-CM | POA: Diagnosis present

## 2019-08-21 DIAGNOSIS — Z7189 Other specified counseling: Secondary | ICD-10-CM

## 2019-08-21 DIAGNOSIS — J9621 Acute and chronic respiratory failure with hypoxia: Secondary | ICD-10-CM | POA: Diagnosis present

## 2019-08-21 DIAGNOSIS — R0602 Shortness of breath: Secondary | ICD-10-CM

## 2019-08-21 DIAGNOSIS — Z9981 Dependence on supplemental oxygen: Secondary | ICD-10-CM

## 2019-08-21 DIAGNOSIS — R630 Anorexia: Secondary | ICD-10-CM | POA: Diagnosis present

## 2019-08-21 DIAGNOSIS — M7989 Other specified soft tissue disorders: Secondary | ICD-10-CM | POA: Diagnosis not present

## 2019-08-21 DIAGNOSIS — Z825 Family history of asthma and other chronic lower respiratory diseases: Secondary | ICD-10-CM

## 2019-08-21 DIAGNOSIS — J9600 Acute respiratory failure, unspecified whether with hypoxia or hypercapnia: Secondary | ICD-10-CM | POA: Diagnosis present

## 2019-08-21 DIAGNOSIS — K219 Gastro-esophageal reflux disease without esophagitis: Secondary | ICD-10-CM | POA: Diagnosis present

## 2019-08-21 DIAGNOSIS — J8410 Pulmonary fibrosis, unspecified: Secondary | ICD-10-CM | POA: Diagnosis present

## 2019-08-21 DIAGNOSIS — Z8701 Personal history of pneumonia (recurrent): Secondary | ICD-10-CM

## 2019-08-21 DIAGNOSIS — Z7952 Long term (current) use of systemic steroids: Secondary | ICD-10-CM

## 2019-08-21 LAB — FIBRINOGEN: Fibrinogen: 401 mg/dL (ref 210–475)

## 2019-08-21 LAB — CBC WITH DIFFERENTIAL/PLATELET
Abs Immature Granulocytes: 0.04 10*3/uL (ref 0.00–0.07)
Basophils Absolute: 0 10*3/uL (ref 0.0–0.1)
Basophils Relative: 0 %
Eosinophils Absolute: 0 10*3/uL (ref 0.0–0.5)
Eosinophils Relative: 0 %
HCT: 37.5 % (ref 36.0–46.0)
Hemoglobin: 11.4 g/dL — ABNORMAL LOW (ref 12.0–15.0)
Immature Granulocytes: 1 %
Lymphocytes Relative: 7 %
Lymphs Abs: 0.4 10*3/uL — ABNORMAL LOW (ref 0.7–4.0)
MCH: 23.3 pg — ABNORMAL LOW (ref 26.0–34.0)
MCHC: 30.4 g/dL (ref 30.0–36.0)
MCV: 76.7 fL — ABNORMAL LOW (ref 80.0–100.0)
Monocytes Absolute: 0.3 10*3/uL (ref 0.1–1.0)
Monocytes Relative: 5 %
Neutro Abs: 5.7 10*3/uL (ref 1.7–7.7)
Neutrophils Relative %: 87 %
Platelets: 217 10*3/uL (ref 150–400)
RBC: 4.89 MIL/uL (ref 3.87–5.11)
RDW: 17.7 % — ABNORMAL HIGH (ref 11.5–15.5)
WBC: 6.5 10*3/uL (ref 4.0–10.5)
nRBC: 0 % (ref 0.0–0.2)

## 2019-08-21 LAB — LACTATE DEHYDROGENASE: LDH: 354 U/L — ABNORMAL HIGH (ref 98–192)

## 2019-08-21 LAB — COMPREHENSIVE METABOLIC PANEL
ALT: 28 U/L (ref 0–44)
AST: 42 U/L — ABNORMAL HIGH (ref 15–41)
Albumin: 3.5 g/dL (ref 3.5–5.0)
Alkaline Phosphatase: 49 U/L (ref 38–126)
Anion gap: 12 (ref 5–15)
BUN: 13 mg/dL (ref 8–23)
CO2: 25 mmol/L (ref 22–32)
Calcium: 8.4 mg/dL — ABNORMAL LOW (ref 8.9–10.3)
Chloride: 101 mmol/L (ref 98–111)
Creatinine, Ser: 0.68 mg/dL (ref 0.44–1.00)
GFR calc Af Amer: >60 mL/min (ref >60)
GFR calc non Af Amer: >60 mL/min (ref >60)
Glucose, Bld: 135 mg/dL — ABNORMAL HIGH (ref 70–99)
Potassium: 3 mmol/L — ABNORMAL LOW (ref 3.5–5.1)
Sodium: 138 mmol/L (ref 135–145)
Total Bilirubin: 0.7 mg/dL (ref 0.3–1.2)
Total Protein: 6.4 g/dL — ABNORMAL LOW (ref 6.5–8.1)

## 2019-08-21 LAB — LACTIC ACID, PLASMA
Lactic Acid, Venous: 1.5 mmol/L (ref 0.5–1.9)
Lactic Acid, Venous: 1.7 mmol/L (ref 0.5–1.9)

## 2019-08-21 LAB — C-REACTIVE PROTEIN: CRP: 1 mg/dL — ABNORMAL HIGH (ref <1.0)

## 2019-08-21 LAB — FERRITIN: Ferritin: 561 ng/mL — ABNORMAL HIGH (ref 11–307)

## 2019-08-21 LAB — TRIGLYCERIDES: Triglycerides: 162 mg/dL — ABNORMAL HIGH (ref <150)

## 2019-08-21 LAB — PROCALCITONIN: Procalcitonin: 0.47 ng/mL

## 2019-08-21 LAB — D-DIMER, QUANTITATIVE: D-Dimer, Quant: 2.41 ug/mL-FEU — ABNORMAL HIGH (ref 0.00–0.50)

## 2019-08-21 MED ORDER — DEXAMETHASONE SODIUM PHOSPHATE 10 MG/ML IJ SOLN
6.0000 mg | INTRAMUSCULAR | Status: DC
  Administered 2019-08-22 – 2019-08-31 (×10): 6 mg via INTRAVENOUS
  Filled 2019-08-21 (×11): qty 1

## 2019-08-21 MED ORDER — SODIUM CHLORIDE 0.9 % IV SOLN
200.0000 mg | Freq: Once | INTRAVENOUS | Status: AC
  Administered 2019-08-21: 200 mg via INTRAVENOUS
  Filled 2019-08-21: qty 200

## 2019-08-21 MED ORDER — DM-GUAIFENESIN ER 30-600 MG PO TB12
1.0000 | ORAL_TABLET | Freq: Two times a day (BID) | ORAL | Status: DC | PRN
  Administered 2019-08-24 – 2019-08-25 (×2): 1 via ORAL
  Filled 2019-08-21 (×2): qty 1

## 2019-08-21 MED ORDER — ZINC SULFATE 220 (50 ZN) MG PO CAPS
220.0000 mg | ORAL_CAPSULE | Freq: Every day | ORAL | Status: DC
  Administered 2019-08-22 – 2019-09-06 (×15): 220 mg via ORAL
  Filled 2019-08-21 (×16): qty 1

## 2019-08-21 MED ORDER — POTASSIUM CHLORIDE CRYS ER 20 MEQ PO TBCR
40.0000 meq | EXTENDED_RELEASE_TABLET | Freq: Once | ORAL | Status: DC
  Filled 2019-08-21: qty 2

## 2019-08-21 MED ORDER — IOHEXOL 350 MG/ML SOLN
80.0000 mL | Freq: Once | INTRAVENOUS | Status: AC | PRN
  Administered 2019-08-22: 80 mL via INTRAVENOUS

## 2019-08-21 MED ORDER — SODIUM CHLORIDE (PF) 0.9 % IJ SOLN
INTRAMUSCULAR | Status: AC
  Filled 2019-08-21: qty 50

## 2019-08-21 MED ORDER — ENOXAPARIN SODIUM 40 MG/0.4ML ~~LOC~~ SOLN
40.0000 mg | SUBCUTANEOUS | Status: DC
  Administered 2019-08-21 – 2019-08-25 (×5): 40 mg via SUBCUTANEOUS
  Filled 2019-08-21 (×5): qty 0.4

## 2019-08-21 MED ORDER — ASCORBIC ACID 500 MG PO TABS
500.0000 mg | ORAL_TABLET | Freq: Every day | ORAL | Status: DC
  Administered 2019-08-22 – 2019-09-06 (×15): 500 mg via ORAL
  Filled 2019-08-21 (×15): qty 1

## 2019-08-21 MED ORDER — HYDROCOD POLST-CPM POLST ER 10-8 MG/5ML PO SUER
5.0000 mL | Freq: Two times a day (BID) | ORAL | Status: DC | PRN
  Administered 2019-08-22 – 2019-09-09 (×13): 5 mL via ORAL
  Filled 2019-08-21 (×13): qty 5

## 2019-08-21 MED ORDER — PIRFENIDONE 267 MG PO TABS
801.0000 mg | ORAL_TABLET | Freq: Three times a day (TID) | ORAL | Status: DC
  Administered 2019-08-22 – 2019-09-10 (×55): 801 mg via ORAL
  Filled 2019-08-21 (×37): qty 3

## 2019-08-21 MED ORDER — ACETAMINOPHEN 500 MG PO TABS
1000.0000 mg | ORAL_TABLET | Freq: Four times a day (QID) | ORAL | Status: DC | PRN
  Administered 2019-08-21 – 2019-09-01 (×29): 1000 mg via ORAL
  Filled 2019-08-21 (×29): qty 2

## 2019-08-21 MED ORDER — DEXAMETHASONE SODIUM PHOSPHATE 10 MG/ML IJ SOLN
6.0000 mg | Freq: Once | INTRAMUSCULAR | Status: AC
  Administered 2019-08-21: 6 mg via INTRAVENOUS
  Filled 2019-08-21: qty 1

## 2019-08-21 MED ORDER — SODIUM CHLORIDE 0.9 % IV SOLN
100.0000 mg | Freq: Every day | INTRAVENOUS | Status: AC
  Administered 2019-08-22 – 2019-08-25 (×4): 100 mg via INTRAVENOUS
  Filled 2019-08-21 (×4): qty 20

## 2019-08-21 MED ORDER — IPRATROPIUM-ALBUTEROL 20-100 MCG/ACT IN AERS
1.0000 | INHALATION_SPRAY | Freq: Four times a day (QID) | RESPIRATORY_TRACT | Status: DC
  Administered 2019-08-22 – 2019-08-25 (×13): 1 via RESPIRATORY_TRACT
  Filled 2019-08-21: qty 4

## 2019-08-21 MED ORDER — ADULT MULTIVITAMIN W/MINERALS CH
1.0000 | ORAL_TABLET | Freq: Every day | ORAL | Status: DC
  Administered 2019-08-22 – 2019-09-10 (×20): 1 via ORAL
  Filled 2019-08-21 (×20): qty 1

## 2019-08-21 MED ORDER — PANTOPRAZOLE SODIUM 40 MG PO TBEC
40.0000 mg | DELAYED_RELEASE_TABLET | Freq: Every day | ORAL | Status: DC
  Administered 2019-08-22 – 2019-09-10 (×20): 40 mg via ORAL
  Filled 2019-08-21 (×20): qty 1

## 2019-08-21 MED ORDER — AZATHIOPRINE 50 MG PO TABS
50.0000 mg | ORAL_TABLET | Freq: Three times a day (TID) | ORAL | Status: DC
  Filled 2019-08-21 (×3): qty 1

## 2019-08-21 NOTE — ED Notes (Signed)
RN attempted to call report at this time. [facility] RN busy with another patient. Will call back.

## 2019-08-21 NOTE — Progress Notes (Addendum)
Spoke over phone with Dr. Alinda Dooms about patient presentation and sx. recommendations to send pt to ER via ambulance. Clinical lead, Anitra RN informed of plan.  911 contacted for transport. Pt updated on plan of care. Pt does not want me to call anyone for her at this time.

## 2019-08-21 NOTE — ED Triage Notes (Signed)
Pt BIB EMS from [facility] infusion center. Pt reports increased SHOB and body aches. Pt has been on 4L but [facility] staff had to place her on 15L non-rebreather due to SpO2 dropping to 80%.   20G RFA

## 2019-08-21 NOTE — Progress Notes (Signed)
Report to EMS, pt transported via stretcher with EMS. All of belongings sent with patient. Pt declined RN offer to update family. Informed EMS that pt brought herself here and car is in parking lot.

## 2019-08-21 NOTE — ED Notes (Signed)
[facility] called for transport in attempt to decrease delay in moving patient to [facility].

## 2019-08-21 NOTE — ED Provider Notes (Signed)
[facility] DEPT Provider Note   CSN: [id number] Arrival date & time: 08/21/19  1219     History No chief complaint on file.   Stacy [name] is a 64 y.o. female presenting for evaluation of shortness of breath.  Patient states last week she started to develop Covid symptoms.  She states she was feeling short of breath.  She was evaluated in the ED on 2-11, found to be Covid positive.  She had subjective fevers and chills at the time.  She states today her shortness of breath worsened significantly.  Her shortness of breath is constant, but worse with exertion.  She wears 4 L of oxygen via nasal cannula at baseline due to pulmonary fibrosis.  She was set up to go to the outpatient infusion center today when her oxygen levels were found to be in the 70s on 6 L of oxygen.  Patient denies known fever, denies significant cough.  Denies chest pain.  She reports some mild nausea and vomiting last week, but none recently.  No abdominal pain, urinary symptoms, abnormal bowel movements.  She lives at home by herself with frequent checks by her daughter.  No one else around her is sick.  Additional history obtained from chart review.  Patient with a history of GERD and pulmonary fibrosis.  She takes prednisone, esbriet, and azathioprine.  HPI     Past Medical History:  Diagnosis Date  . GERD (gastroesophageal reflux disease)   . Pneumonia   . Pulmonary fibrosis [facility])     Patient Active Problem List   Diagnosis Date Noted  . COVID-19 virus infection 08/21/2019  . Acute on chronic respiratory failure with hypercapnia ([place]) 07/25/2019  . Community acquired pneumonia 07/20/2019  . Cough 12/16/2018  . Pulmonary fibrosis ([place]) 04/06/2018  . ILD (interstitial lung disease) ([place]) 04/06/2018  . Gastroesophageal reflux disease 04/06/2018  . Osteoporosis 04/06/2018    Past Surgical History:  Procedure Laterality Date  . ANKLE SURGERY Left    fx, has plates  .  COLONOSCOPY    . ESOPHAGOGASTRODUODENOSCOPY    . HEMORRHOID SURGERY    . LUNG BIOPSY    . TUBAL LIGATION       OB History   No obstetric history on file.     Family History  Problem Relation Age of Onset  . Diabetes Mother   . Cancer Father        unsure of primary source, farmer  . Asthma Brother     Social History   Tobacco Use  . Smoking status: Former Smoker    Packs/day: 2.00    Types: Cigarettes    Start date: 07/1967    Quit date: 07/1993    Years since quitting: 26.1  . Smokeless tobacco: Never Used  Substance Use Topics  . Alcohol use: Yes    Comment: rarely  . Drug use: Never    Home Medications Prior to Admission medications   Medication Sig Start Date End Date Taking? Authorizing Provider  acetaminophen (TYLENOL) 500 MG tablet Take 1,000 mg by mouth every 6 (six) hours as needed for moderate pain. Take 3 tablets twice daily    Yes [provider]  azaTHIOprine (IMURAN) 50 MG tablet TAKE 3 TABLETS EVERY DAY Patient taking differently: Take 50 mg by mouth 3 (three) times daily.  07/12/19  Yes Mannam, Praveen, MD  chlorpheniramine-HYDROcodone (TUSSIONEX) 10-8 MG/5ML SUER Take 5 mLs by mouth every 12 (twelve) hours as needed for cough. 07/27/19  Yes Tamala Julian,  Darnelle Maffucci, MD  dextromethorphan-guaiFENesin [facility] DM) 30-600 MG 12hr tablet Take 1 tablet by mouth 2 (two) times daily as needed for cough. 07/25/19  Yes Rizwan, Eunice Blase, MD  ESBRIET 267 MG TABS TAKE 3 TABLETS (801MG) BY MOUTH THREE TIMES A DAY Patient taking differently: Take 801 mg by mouth 3 (three) times daily.  07/22/19  Yes Mannam, Praveen, MD  Multiple Vitamins-Minerals (CENTRUM SILVER) tablet Take 1 tablet by mouth daily.    Yes [provider]  omeprazole (PRILOSEC) 20 MG capsule Take 1 capsule (20 mg total) by mouth daily. 12/28/18  Yes Marrian Salvage, [name]  OXYGEN Inhale into the lungs. 3 liters per minute, uses when up and moving around   Yes [provider]    predniSONE (DELTASONE) 5 MG tablet Take 5 mg by mouth daily with breakfast.   Yes [provider]  simvastatin (ZOCOR) 20 MG tablet TAKE 1 TABLET DAILY AT 6PM (NO FURTHER REFILLS WITHOUT OFFICE VISIT) Patient taking differently: Take 20 mg by mouth daily at 6 PM.  07/08/19  Yes Biagio Borg, MD  sulfamethoxazole-trimethoprim (BACTRIM DS) 800-160 MG tablet Take 1 tablet by mouth 3 (three) times a week. TAKE 1 TABLET EVERY DAY ON MONDAY, [place] Patient not taking: Reported on 08/21/2019 01/22/19   Marshell Garfinkel, MD  traZODone (DESYREL) 100 MG tablet TAKE 1 TABLET (100 MG TOTAL) BY MOUTH AT BEDTIME AS NEEDED FOR SLEEP (NO FURTHER REFILLS WITHOUT OFFICE VISIT) Patient not taking: Reported on 08/21/2019 07/08/19   Biagio Borg, MD    Allergies    Patient has no known allergies.  Review of Systems   Review of Systems  Respiratory: Positive for shortness of breath.   Allergic/Immunologic: Positive for immunocompromised state.  All other systems reviewed and are negative.   Physical Exam Updated Vital Signs BP 102/69   Pulse 62   Temp 99.7 F (37.6 C) (Oral)   Resp 20   Ht _0  (1.676 m)   Wt 68 kg   SpO2 100%   BMI 24.20 kg/m   Physical Exam Vitals and nursing note reviewed.  Constitutional:      Appearance: She is well-developed. She is ill-appearing.     Comments: Appears older than stated age.  Appears ill.  HENT:     Head: Normocephalic and atraumatic.  Eyes:     Extraocular Movements: Extraocular movements intact.     Conjunctiva/sclera: Conjunctivae normal.     Pupils: Pupils are equal, round, and reactive to light.  Cardiovascular:     Rate and Rhythm: Regular rhythm. Tachycardia present.     Pulses: Normal pulses.     Comments: Mildly tachycardic around 110 Pulmonary:     Effort: Tachypnea present.     Breath sounds: No wheezing.     Comments: Tachypneic.  Speaking in short sentences.  No accessory muscle use.  Sats after exertion on home  O2 dose of 4 L at 45% and pt appears very dyspneic.  After rest, sats improved to 60%.  Patient placed on 15 L via nonrebreather, sats improved to mid 90s. Abdominal:     General: There is no distension.     Palpations: Abdomen is soft.     Tenderness: There is no abdominal tenderness.  Musculoskeletal:        General: Normal range of motion.     Cervical back: Normal range of motion and neck supple.  Skin:    General: Skin is warm and dry.  Capillary Refill: Capillary refill takes less than 2 seconds.  Neurological:     Mental Status: She is alert and oriented to person, place, and time.     ED Results / Procedures / Treatments   Labs (all labs ordered are listed, but only abnormal results are displayed) Labs Reviewed  CBC WITH DIFFERENTIAL/PLATELET - Abnormal; Notable for the following components:      Result Value   Hemoglobin 11.4 (*)    MCV 76.7 (*)    MCH 23.3 (*)    RDW 17.7 (*)    Lymphs Abs 0.4 (*)    All other components within normal limits  COMPREHENSIVE METABOLIC PANEL - Abnormal; Notable for the following components:   Potassium 3.0 (*)    Glucose, Bld 135 (*)    Calcium 8.4 (*)    Total Protein 6.4 (*)    AST 42 (*)    All other components within normal limits  D-DIMER, QUANTITATIVE (NOT AT [facility]) - Abnormal; Notable for the following components:   D-Dimer, Quant 2.41 (*)    All other components within normal limits  LACTATE DEHYDROGENASE - Abnormal; Notable for the following components:   LDH 354 (*)    All other components within normal limits  FERRITIN - Abnormal; Notable for the following components:   Ferritin 561 (*)    All other components within normal limits  TRIGLYCERIDES - Abnormal; Notable for the following components:   Triglycerides 162 (*)    All other components within normal limits  C-REACTIVE PROTEIN - Abnormal; Notable for the following components:   CRP 1.0 (*)    All other components within normal limits  CULTURE, BLOOD (ROUTINE  X 2)  CULTURE, BLOOD (ROUTINE X 2)  LACTIC ACID, PLASMA  PROCALCITONIN  FIBRINOGEN  LACTIC ACID, PLASMA    EKG EKG Interpretation  Date/Time:  Saturday August 21 2019 13:36:35 EST Ventricular Rate:  73 PR Interval:    QRS Duration: 142 QT Interval:  402 QTC Calculation: 443 R Axis:   -93 Text Interpretation: Sinus rhythm Right bundle branch block No STEMI Confirmed by Nanda Quinton [phone]) on 08/21/2019 1:56:51 PM   Radiology DG Chest Port 1 View  Result Date: 08/21/2019 CLINICAL DATA:  Hypoxia, COVID-19. EXAM: PORTABLE CHEST 1 VIEW COMPARISON:  Chest x-rays dated 08/19/2019 and 07/20/2019 FINDINGS: Interstitial lung opacities are again seen bilaterally, RIGHT slightly greater than LEFT, most likely related to chronic interstitial lung disease/fibrosis. The LEFT upper lobe is now clear, compatible with resolution of the previously demonstrated LEFT upper lobe pneumonia. No evidence of a new pneumonia. No pleural effusion or pneumothorax is seen. Stable cardiomegaly. IMPRESSION: 1. Resolution of the previous LEFT upper lobe pneumonia. No new lung findings. No evidence of a new pneumonia. 2. Stable interstitial lung opacities bilaterally, most likely chronic interstitial lung disease/fibrosis. 3. Stable cardiomegaly. Electronically Signed   By: Franki Cabot M.D.   On: 08/21/2019 13:22    Procedures .Critical Care Performed by: Franchot Heidelberg, PA-C Authorized by: Franchot Heidelberg, PA-C   Critical care provider statement:    Critical care time (minutes):  45   Critical care time was exclusive of:  Separately billable procedures and treating other patients and teaching time   Critical care was necessary to treat or prevent imminent or life-threatening deterioration of the following conditions:  Respiratory failure   Critical care was time spent personally by me on the following activities:  Blood draw for specimens, development of treatment plan with patient or surrogate,  evaluation of  patient's response to treatment, examination of patient, obtaining history from patient or surrogate, ordering and performing treatments and interventions, ordering and review of laboratory studies, ordering and review of radiographic studies, pulse oximetry, re-evaluation of patient's condition and review of old charts   I assumed direction of critical care for this patient from another provider in my specialty: no   Comments:     Patient severely hypoxic likely due to coronavirus.  Placed on nonrebreather and monitor closely.  Needs to be admitted to the hospital.   (including critical care time)  Medications Ordered in ED Medications  remdesivir 200 mg in sodium chloride 0.9% 250 mL IVPB (0 mg Intravenous Stopped 08/21/19 1437)    Followed by  remdesivir 100 mg in sodium chloride 0.9 % 100 mL IVPB (has no administration in time range)  potassium chloride SA (KLOR-CON) CR tablet 40 mEq (has no administration in time range)  dexamethasone (DECADRON) injection 6 mg (6 mg Intravenous Given 08/21/19 1404)    ED Course  I have reviewed the triage vital signs and the nursing notes.  Pertinent labs & imaging results that were available during my care of the patient were reviewed by me and considered in my medical decision making (see chart for details).    MDM Rules/Calculators/A&P                      Patient presenting for evaluation of shortness of breath and hypoxia.  Physical exam shows patient who appears ill.  On her home O2, sats are extremely low.  Patient placed on nonrebreather, sats improved.  Patient has been tested positive for Covid 2 days ago.  This is likely the cause for her shortness of breath and hypoxia.  Will obtain x-ray and labs, and admit patient to the hospital.  X-ray viewed interpreted by me, no obvious infection.  Per radiology, left pneumonia has resolved and there are chronic changes consistent with pulmonary fibrosis.  Labs interpreted by me, show  elevated inflammatory markers consistent with Covid.  Hemoglobin stable.  Potassium mildly low at 3.  Will call for admission.  Discussed with Akula from [facility] hospitalist service, patient to be admitted.  Final Clinical Impression(s) / ED Diagnoses Final diagnoses:  Acute hypoxemic respiratory failure due to COVID-19 [facility])  Hypokalemia    Rx / DC Orders ED Discharge Orders    None       Franchot Heidelberg, PA-C 08/21/19 1638    Dorie Rank, MD 08/21/19 Joen Laura

## 2019-08-21 NOTE — ED Notes (Signed)
[facility] has been called at this time.

## 2019-08-21 NOTE — ED Provider Notes (Signed)
Medical screening examination/treatment/procedure(s) were conducted as a shared visit with non-physician practitioner(s) and myself.  I personally evaluated the patient during the encounter.  Patient presented to ED for evaluation of hypoxia associated with known Covid illness.  Patient has history of pulmonary fibrosis.  She was diagnosed a couple days ago.  Patient was referred for monoclonal antibody treatment.  Patient was at the center today for treatment but she was noted to be hypoxic.  Patient felt like her shortness of breath became worse today  Physical Exam  BP 102/69   Pulse 62   Temp 99.7 F (37.6 C) (Oral)   Resp 20   Ht 1.676 m (_0 )   Wt 68 kg   SpO2 100%   BMI 24.20 kg/m   Physical Exam Alert, able to speak in full sentences Heart regular rate and rhythm Lungs: Crackles noted Abdomen soft nontender Extremities no edema ED Course/Procedures     Procedures  EKG Interpretation  Date/Time:  Saturday August 21 2019 13:36:35 EST Ventricular Rate:  73 PR Interval:    QRS Duration: 142 QT Interval:  402 QTC Calculation: 443 R Axis:   -93 Text Interpretation: Sinus rhythm Right bundle branch block No STEMI Confirmed by Nanda Quinton [phone]) on 08/21/2019 1:56:51 PM       MDM   NO def pna on CXR.New worsening oxygen requirement.   At risk for complications. REmdesivir and steroids ordered.   Admit for further treatment.       Dorie Rank, MD 08/21/19 1530

## 2019-08-21 NOTE — ED Notes (Signed)
Karleen Hampshire, MD told Tamar Lipscomb,RN to let CT know that the CT scan can wait until patient gets to [facility].

## 2019-08-21 NOTE — H&P (Signed)
History and Physical    Stacy [name] MNO:[id number] DOB: 1955-11-26 DOA: 08/21/2019  PCP: Marrian Salvage, [name]  Patient coming from: Home  I have personally briefly reviewed patient's old medical records in [place]  Chief Complaint: worsening sob and hypoxia.   HPI: Stacy [name] is a 64 y.o. female with medical history significant of GERD, pulmonary fibrosis on Imuran and Pirfenidone was recently tested positive for COVID 19 viral illness, presents to ED with sob. She reports subjective fevers, chills, headache, and some nausea. On arrival to ED, she was saturating in low 40% on 4lit of [place] oxygen. She denies vomiting and diarrhea. She denies chest pain, dizziness, tingling or numbness, dysuria, hematochezia. She was set up to go to the infusion center but was found hypoxic and sent to [facility] ED for further evaluation.  On arrival to ED, she was hypoxic and was put on NRB, febrile with a fever of 100.7, tachycardic, tachypneic, normotensive.  Labs were significant for poc COVID 19 positive, potassium of 3, and LDH of 354, ferritin of 561, CRP of 1, lactic acid of 1.5, and pro calcitonin of 0. 47, hemoglobin of 11.4 and wbc count of 6.5, and D DIMER OF 2.41.  She was ordered IV remdesivir and IV steroids.   She was referred to medical service for admission for acute COVID 19  viral illness .   CXR shows Resolution of the previous LEFT upper lobe pneumonia. No new lung findings. No evidence of a new pneumonia. Stable interstitial lung opacities bilaterally, most likely chronic interstitial lung disease/fibrosis. Stable cardiomegaly.  CT angio of the chest ordered for evaluation of PE.    Review of Systems: As per HPI otherwise 10 point review of systems negative.    Past Medical History:  Diagnosis Date  . GERD (gastroesophageal reflux disease)   . Pneumonia   . Pulmonary fibrosis ([place])     Past Surgical History:  Procedure Laterality Date  . ANKLE SURGERY Left    fx, has plates  . COLONOSCOPY    . ESOPHAGOGASTRODUODENOSCOPY    . HEMORRHOID SURGERY    . LUNG BIOPSY    . TUBAL LIGATION     Social History:   reports that she quit smoking about 26 years ago. Her smoking use included cigarettes. She started smoking about 52 years ago. She smoked 2.00 packs per day. She has never used smokeless tobacco. She reports current alcohol use. She reports that she does not use drugs.  No Known Allergies  Family History  Problem Relation Age of Onset  . Diabetes Mother   . Cancer Father        unsure of primary source, farmer  . Asthma Brother     Family history reviewed and pertinent.    Prior to Admission medications   Medication Sig Start Date End Date Taking? Authorizing Provider  acetaminophen (TYLENOL) 500 MG tablet Take 1,000 mg by mouth every 6 (six) hours as needed for moderate pain. Take 3 tablets twice daily    Yes [provider]  azaTHIOprine (IMURAN) 50 MG tablet TAKE 3 TABLETS EVERY DAY Patient taking differently: Take 50 mg by mouth 3 (three) times daily.  07/12/19  Yes Mannam, Praveen, MD  chlorpheniramine-HYDROcodone (TUSSIONEX) 10-8 MG/5ML SUER Take 5 mLs by mouth every 12 (twelve) hours as needed for cough. 07/27/19  Yes Candee Furbish, MD  dextromethorphan-guaiFENesin [facility] DM) 30-600 MG 12hr tablet Take 1 tablet by mouth 2 (two) times daily as needed for cough. 07/25/19  Yes Rizwan, Eunice Blase, MD  ESBRIET 267 MG TABS TAKE 3 TABLETS (801MG) BY MOUTH THREE TIMES A DAY Patient taking differently: Take 801 mg by mouth 3 (three) times daily.  07/22/19  Yes Mannam, Praveen, MD  Multiple Vitamins-Minerals (CENTRUM SILVER) tablet Take 1 tablet by mouth daily.    Yes [provider]  omeprazole (PRILOSEC) 20 MG capsule Take 1 capsule (20 mg total) by mouth daily. 12/28/18  Yes Marrian Salvage, [name]  OXYGEN Inhale into the lungs. 3 liters per minute, uses when up and moving around   Yes [provider]  predniSONE  (DELTASONE) 5 MG tablet Take 5 mg by mouth daily with breakfast.   Yes [provider]  simvastatin (ZOCOR) 20 MG tablet TAKE 1 TABLET DAILY AT 6PM (NO FURTHER REFILLS WITHOUT OFFICE VISIT) Patient taking differently: Take 20 mg by mouth daily at 6 PM.  07/08/19  Yes Biagio Borg, MD  sulfamethoxazole-trimethoprim (BACTRIM DS) 800-160 MG tablet Take 1 tablet by mouth 3 (three) times a week. TAKE 1 TABLET EVERY DAY ON MONDAY, [place] Patient not taking: Reported on 08/21/2019 01/22/19   Marshell Garfinkel, MD  traZODone (DESYREL) 100 MG tablet TAKE 1 TABLET (100 MG TOTAL) BY MOUTH AT BEDTIME AS NEEDED FOR SLEEP (NO FURTHER REFILLS WITHOUT OFFICE VISIT) Patient not taking: Reported on 08/21/2019 07/08/19   Biagio Borg, MD    Physical Exam: Vitals:   08/21/19 1545 08/21/19 1600 08/21/19 1615 08/21/19 1630  BP:  111/70  117/69  Pulse: (!) 57 (!) 57 64 62  Resp: 17 17 (!) 28 (!) 24  Temp:      TempSrc:      SpO2: 100% 100% 100% 100%  Weight:      Height:        Constitutional: NAD, calm, comfortable Vitals:   08/21/19 1545 08/21/19 1600 08/21/19 1615 08/21/19 1630  BP:  111/70  117/69  Pulse: (!) 57 (!) 57 64 62  Resp: 17 17 (!) 28 (!) 24  Temp:      TempSrc:      SpO2: 100% 100% 100% 100%  Weight:      Height:       Eyes: PERRL, lids and conjunctivae normal ENMT: Mucous membranes are moist.  Neck: normal, supple, no masses, Respiratory:  Tachypnea present, diminished air entry throughout the lungs.  Cardiovascular: S1S2 +, Tachycardic. Marland Kitchen No extremity edema. Abdomen: no tenderness, no masses palpated. No hepatosplenomegaly. Bowel sounds positive.  Musculoskeletal: no clubbing / cyanosis. No joint deformity upper and lower extremities.  Skin: no rashes, lesions, ulcers. No induration Neurologic: CN 2-12 grossly intact. Sensation intact, DTR normal. Strength 5/5 in all 4.  Psychiatric: Normal judgment and insight. Alert and oriented x 3. Normal mood.      Labs on Admission: I have personally reviewed following labs and imaging studies  CBC: Recent Labs  Lab 08/19/19 1518 08/21/19 1350  WBC 7.0 6.5  NEUTROABS 5.7 5.7  HGB 12.6 11.4*  HCT 41.3 37.5  MCV 77.2* 76.7*  PLT 260 161   Basic Metabolic Panel: Recent Labs  Lab 08/19/19 1518 08/21/19 1350  NA 138 138  K 3.7 3.0*  CL 102 101  CO2 26 25  GLUCOSE 111* 135*  BUN 14 13  CREATININE 0.68 0.68  CALCIUM 9.1 8.4*   GFR: Estimated Creatinine Clearance: 65.6 mL/min (by C-G formula based on SCr of 0.68 mg/dL). Liver Function Tests: Recent Labs  Lab 08/19/19 1518 08/21/19 1350  AST 45*  42*  ALT 44 28  ALKPHOS 48 49  BILITOT 0.8 0.7  PROT 6.7 6.4*  ALBUMIN 3.5 3.5   No results for input(s): LIPASE, AMYLASE in the last 168 hours. No results for input(s): AMMONIA in the last 168 hours. Coagulation Profile: No results for input(s): INR, PROTIME in the last 168 hours. Cardiac Enzymes: No results for input(s): CKTOTAL, CKMB, CKMBINDEX, TROPONINI in the last 168 hours. BNP (last 3 results) Recent Labs    01/28/19 1602  PROBNP 74.0   HbA1C: No results for input(s): HGBA1C in the last 72 hours. CBG: No results for input(s): GLUCAP in the last 168 hours. Lipid Profile: Recent Labs    08/21/19 1350  TRIG 162*   Thyroid Function Tests: No results for input(s): TSH, T4TOTAL, FREET4, T3FREE, THYROIDAB in the last 72 hours. Anemia Panel: Recent Labs    08/21/19 1350  FERRITIN 561*   Urine analysis:    Component Value Date/Time   COLORURINE YELLOW 07/21/2019 0333   APPEARANCEUR HAZY (A) 07/21/2019 0333   LABSPEC 1.013 07/21/2019 0333   PHURINE 6.0 07/21/2019 0333   GLUCOSEU NEGATIVE 07/21/2019 0333   HGBUR NEGATIVE 07/21/2019 0333   BILIRUBINUR NEGATIVE 07/21/2019 0333   KETONESUR 5 (A) 07/21/2019 0333   PROTEINUR NEGATIVE 07/21/2019 0333   NITRITE NEGATIVE 07/21/2019 0333   LEUKOCYTESUR TRACE (A) 07/21/2019 0333    Radiological Exams on  Admission: DG Chest Port 1 View  Result Date: 08/21/2019 CLINICAL DATA:  Hypoxia, COVID-19. EXAM: PORTABLE CHEST 1 VIEW COMPARISON:  Chest x-rays dated 08/19/2019 and 07/20/2019 FINDINGS: Interstitial lung opacities are again seen bilaterally, RIGHT slightly greater than LEFT, most likely related to chronic interstitial lung disease/fibrosis. The LEFT upper lobe is now clear, compatible with resolution of the previously demonstrated LEFT upper lobe pneumonia. No evidence of a new pneumonia. No pleural effusion or pneumothorax is seen. Stable cardiomegaly. IMPRESSION: 1. Resolution of the previous LEFT upper lobe pneumonia. No new lung findings. No evidence of a new pneumonia. 2. Stable interstitial lung opacities bilaterally, most likely chronic interstitial lung disease/fibrosis. 3. Stable cardiomegaly. Electronically Signed   By: Franki Cabot M.D.   On: 08/21/2019 13:22    EKG: Independently reviewed. Sinus rhythm with RBBB.   Assessment/Plan Active Problems:   COVID-19 virus infection  Acute COVID 19 VIRAL illness:  Admit to [facility], pt is currently requiring NRB to keep sats greater than 90%.  Started her on IV Remdisivir and IV steroids.  Continue with inhalers, bronchodilators and tussinex Follow inflammatory markers.  CT angiogram of the lungs ordered for evaluation of PE.  Not a candidate for Actemra as pt is on chronic immunosuppressants    GERD: Stable, continue with PPI.     Pulmonary fibrosis:  Resume Imuran and Pirfenidone   Severity of Illness: The appropriate patient status for this patient is INPATIENT. Inpatient status is judged to be reasonable and necessary in order to provide the required intensity of service to ensure the patient's safety. The patient's presenting symptoms, physical exam findings, and initial radiographic and laboratory data in the context of their chronic comorbidities is felt to place them at high risk for further clinical deterioration.  Furthermore, it is not anticipated that the patient will be medically stable for discharge from the hospital within 2 midnights of admission.  * I certify that at the point of admission it is my clinical judgment that the patient will require inpatient hospital care spanning beyond 2 midnights from the point of admission due to high intensity  of service, high risk for further deterioration and high frequency of surveillance required.*     DVT prophylaxis: lovenox Code Status: FULL CODE.  Family Communication: NONE at bedside.  Disposition Plan: admit to [place] called: none.  Admission status: inpatient/ progressive care.   Hosie Poisson MD [facility]   If 7PM-7AM, please contact night-coverage [email or web address] Use universal  password for that web site. If you do not have the password, please call the hospital operator.  08/21/2019, 4:55 PM

## 2019-08-21 NOTE — Progress Notes (Addendum)
Pt arrived to clinic for infusion, increased RR and WOB and with low oxygen in 70's  Pt arrived on her home 4L [place]. Pt with hx of pulmonary fibrosis and states that she usually is able to ambulate and perform ADL's without respiratory distress. Pt states walking to car today made her become very SOB and did not recover throughout 10 minute drive to clinic even when she upped her oxygen to 5L [place] during drive. Pt upped on to 6L [place] on arrival due to respiratory distress and low saturation. Without improvement of saturations on 6L [place], pt was placed on NRB at 15L with instructions for deep breathing. Pt oxygen saturation increased into low 80's and then into 90's. Pt able to answer questions with one or two words at a time. Pt appears fatigued.   Anitra, clinical lead RN informed and this RN has reached out to bed placement for return call from a provider for possible hospital admission. Will continue to monitor patient.

## 2019-08-22 DIAGNOSIS — J9601 Acute respiratory failure with hypoxia: Secondary | ICD-10-CM

## 2019-08-22 DIAGNOSIS — J1282 Pneumonia due to coronavirus disease 2019: Secondary | ICD-10-CM

## 2019-08-22 LAB — CBC WITH DIFFERENTIAL/PLATELET
Abs Immature Granulocytes: 0.04 10*3/uL (ref 0.00–0.07)
Basophils Absolute: 0 10*3/uL (ref 0.0–0.1)
Basophils Relative: 0 %
Eosinophils Absolute: 0 10*3/uL (ref 0.0–0.5)
Eosinophils Relative: 0 %
HCT: 36.3 % (ref 36.0–46.0)
Hemoglobin: 11 g/dL — ABNORMAL LOW (ref 12.0–15.0)
Immature Granulocytes: 1 %
Lymphocytes Relative: 11 %
Lymphs Abs: 0.6 10*3/uL — ABNORMAL LOW (ref 0.7–4.0)
MCH: 23.1 pg — ABNORMAL LOW (ref 26.0–34.0)
MCHC: 30.3 g/dL (ref 30.0–36.0)
MCV: 76.1 fL — ABNORMAL LOW (ref 80.0–100.0)
Monocytes Absolute: 0.3 10*3/uL (ref 0.1–1.0)
Monocytes Relative: 6 %
Neutro Abs: 4.3 10*3/uL (ref 1.7–7.7)
Neutrophils Relative %: 82 %
Platelets: 248 10*3/uL (ref 150–400)
RBC: 4.77 MIL/uL (ref 3.87–5.11)
RDW: 17.6 % — ABNORMAL HIGH (ref 11.5–15.5)
WBC: 5.2 10*3/uL (ref 4.0–10.5)
nRBC: 0 % (ref 0.0–0.2)

## 2019-08-22 LAB — COMPREHENSIVE METABOLIC PANEL
ALT: 24 U/L (ref 0–44)
AST: 41 U/L (ref 15–41)
Albumin: 3.1 g/dL — ABNORMAL LOW (ref 3.5–5.0)
Alkaline Phosphatase: 46 U/L (ref 38–126)
Anion gap: 7 (ref 5–15)
BUN: 10 mg/dL (ref 8–23)
CO2: 30 mmol/L (ref 22–32)
Calcium: 8.5 mg/dL — ABNORMAL LOW (ref 8.9–10.3)
Chloride: 104 mmol/L (ref 98–111)
Creatinine, Ser: 0.53 mg/dL (ref 0.44–1.00)
GFR calc Af Amer: >60 mL/min (ref >60)
GFR calc non Af Amer: >60 mL/min (ref >60)
Glucose, Bld: 118 mg/dL — ABNORMAL HIGH (ref 70–99)
Potassium: 4.3 mmol/L (ref 3.5–5.1)
Sodium: 141 mmol/L (ref 135–145)
Total Bilirubin: 0.5 mg/dL (ref 0.3–1.2)
Total Protein: 6.1 g/dL — ABNORMAL LOW (ref 6.5–8.1)

## 2019-08-22 LAB — MAGNESIUM: Magnesium: 1.6 mg/dL — ABNORMAL LOW (ref 1.7–2.4)

## 2019-08-22 LAB — C-REACTIVE PROTEIN: CRP: 4.4 mg/dL — ABNORMAL HIGH (ref <1.0)

## 2019-08-22 LAB — D-DIMER, QUANTITATIVE: D-Dimer, Quant: 2 ug/mL-FEU — ABNORMAL HIGH (ref 0.00–0.50)

## 2019-08-22 LAB — ABO/RH: ABO/RH(D): O POS

## 2019-08-22 LAB — FERRITIN: Ferritin: 665 ng/mL — ABNORMAL HIGH (ref 11–307)

## 2019-08-22 LAB — PHOSPHORUS: Phosphorus: 3.2 mg/dL (ref 2.5–4.6)

## 2019-08-22 MED ORDER — SODIUM CHLORIDE 0.9 % IV SOLN: Freq: Once | INTRAVENOUS | Status: DC

## 2019-08-22 MED ORDER — EPINEPHRINE 0.3 MG/0.3ML IJ SOAJ: 0.3000 mg | Freq: Once | INTRAMUSCULAR | Status: DC | PRN

## 2019-08-22 MED ORDER — ALBUTEROL SULFATE HFA 108 (90 BASE) MCG/ACT IN AERS: 2.0000 | INHALATION_SPRAY | Freq: Once | RESPIRATORY_TRACT | Status: DC | PRN

## 2019-08-22 MED ORDER — IOHEXOL 350 MG/ML SOLN: 100.0000 mL | Freq: Once | INTRAVENOUS | Status: DC | PRN

## 2019-08-22 MED ORDER — FAMOTIDINE IN NACL 20-0.9 MG/50ML-% IV SOLN: 20.0000 mg | Freq: Once | INTRAVENOUS | Status: DC | PRN

## 2019-08-22 MED ORDER — SODIUM CHLORIDE 0.9 % IV SOLN: INTRAVENOUS | Status: DC | PRN

## 2019-08-22 MED ORDER — METHYLPREDNISOLONE SODIUM SUCC 125 MG IJ SOLR: 125.0000 mg | Freq: Once | INTRAMUSCULAR | Status: DC | PRN

## 2019-08-22 MED ORDER — DIPHENHYDRAMINE HCL 50 MG/ML IJ SOLN: 50.0000 mg | Freq: Once | INTRAMUSCULAR | Status: DC | PRN

## 2019-08-22 NOTE — Progress Notes (Signed)
Pharmacy Medication Storage Note  Storing home medications for Stacy [name] in pharmacy secured storage.   Medication storage bag number: [id number]  Delivered to pharmacy @ 17:50 (time) by Loren Racer (RN name)  Medications will be returned to patient/caregiver upon discharge.  Peggyann Juba, PharmD, BCPS Pharmacy: [phone] 08/22/19 5:50 PM

## 2019-08-22 NOTE — Plan of Care (Signed)
Marland Kitchen

## 2019-08-22 NOTE — Progress Notes (Signed)
[facility] PROGRESS NOTE    Progress Note  Stacy [name]  IWP:[id number] DOB: 08/02/1955 DOA: 08/21/2019 PCP: Marrian Salvage, [name]     Brief Narrative:   Stacy [name] is an 64 y.o. female past medical history significant for GERD, pulmonary fibrosis on 4 L of oxygen at home on Imuran and Pirfenidone  for SARS-CoV-2 on 08/19/2019 into the ED with shortness of breath, subjective fevers, chills and nausea in the ED she was found to be satting in the low 40s placed on 4 L of oxygen and saturations improved.  Chest x-ray showed resolution of her left upper lobe pneumonia seen on previous chest x-ray.  CT angio of the chest showed no evidence of PE, did show pulmonary fibrosis with reactive lymphadenopathy.  Assessment/Plan:   Acute respiratory failure with hypoxia due to interstitial lung disease: She is requiring nonrebreather 15 L of oxygen to keep saturations greater than 90%. Started empirically on IV remdesivir and steroids her SARS-CoV-2 PCR was positive on 08/18/2018. Her inflammatory markers. With discussed with pulmonary and critical care and see if we could give her convalescent plasma she has agreed to it.  She is more than 10 days out from when he symptoms started. Continue inhalers and bronchodilators, the patient is not a candidate for Actemra due to immunosuppression.  Gastroesophageal reflux disease: Continue PPI.  IPF: Resume Imuran and pirfenidone    DVT prophylaxis: lovenox Family Communication:none Disposition Plan/Barrier to D/C:   Code Status:     Code Status Orders  (From admission, onward)         Start     Ordered   08/21/19 2224  Full code  Continuous     08/21/19 2224        Code Status History    Date Active Date Inactive Code Status Order ID Comments User Context   07/21/2019 0333 07/25/2019 1843 Full Code [id number]  Bonnell Public, MD Inpatient   Advance Care Planning Activity        IV Access:    Peripheral  IV   Procedures and diagnostic studies:   CT ANGIO CHEST PE W OR WO CONTRAST  Result Date: 08/22/2019 CLINICAL DATA:  Shortness of breath, history of pulmonary fibrosis EXAM: CT ANGIOGRAPHY CHEST WITH CONTRAST TECHNIQUE: Multidetector CT imaging of the chest was performed using the standard protocol during bolus administration of intravenous contrast. Multiplanar CT image reconstructions and MIPs were obtained to evaluate the vascular anatomy. CONTRAST:  74m OMNIPAQUE IOHEXOL 350 MG/ML SOLN COMPARISON:  07/20/2019 FINDINGS: Cardiovascular: Satisfactory opacification the bilateral pulmonary arteries to the segmental level. No evidence of pulmonary embolism. Enlargement of the pulmonary arteries, suggesting pulmonary arterial hypertension. No evidence of thoracic aortic aneurysm or dissection. Atherosclerotic calcifications of the aortic arch. The heart is top-normal in size.  No pericardial effusion. Mediastinum/Nodes: Small mediastinal lymph nodes, including a dominant 12 mm short axis subcarinal node (series 5/image 77, previously 11 mm. Bilateral hilar/perihilar lymphadenopathy, measuring up to 13 mm short axis on the right, previously 12 mm. This appearance is favored to be reactive. Visualized right thyroid is mildly enlarged/nodular. Lungs/Pleura: Residual post infectious/inflammatory scarring in the left upper lobe (series 7/image 38). Multifocal patchy/ground-glass opacities with subpleural reticulation in the lungs bilaterally, lower lobe predominant, compatible with pulmonary fibrosis. Trace loculated pleural fluid along the left major fissure, minimally increased from the prior. No suspicious pulmonary nodules. No pneumothorax. Upper Abdomen: Visualized upper abdomen is notable for a dominant anterior right upper pole renal cyst measuring at least 6.5  cm, incompletely visualized. Musculoskeletal: Visualized osseous structures are within normal limits. Review of the MIP images confirms the above  findings. IMPRESSION: No evidence of pulmonary embolism. Pulmonary fibrosis, unchanged.  Suspected reactive lymphadenopathy. Prior left upper lobe pneumonia has resolved, with mild residual post infectious/inflammatory scarring. Suspected pulmonary arterial hypertension. Aortic Atherosclerosis (ICD10-I70.0). Electronically Signed   By: Julian Hy M.D.   On: 08/22/2019 01:10   DG Chest Port 1 View  Result Date: 08/21/2019 CLINICAL DATA:  Hypoxia, COVID-19. EXAM: PORTABLE CHEST 1 VIEW COMPARISON:  Chest x-rays dated 08/19/2019 and 07/20/2019 FINDINGS: Interstitial lung opacities are again seen bilaterally, RIGHT slightly greater than LEFT, most likely related to chronic interstitial lung disease/fibrosis. The LEFT upper lobe is now clear, compatible with resolution of the previously demonstrated LEFT upper lobe pneumonia. No evidence of a new pneumonia. No pleural effusion or pneumothorax is seen. Stable cardiomegaly. IMPRESSION: 1. Resolution of the previous LEFT upper lobe pneumonia. No new lung findings. No evidence of a new pneumonia. 2. Stable interstitial lung opacities bilaterally, most likely chronic interstitial lung disease/fibrosis. 3. Stable cardiomegaly. Electronically Signed   By: Franki Cabot M.D.   On: 08/21/2019 13:22     Medical Consultants:    None.  Anti-Infectives:   IV remdesivir  Subjective:    Stacy [name] she relates her breathing is about the same as yesterday.  Objective:    Vitals:   08/21/19 2217 08/21/19 2334 08/22/19 0000 08/22/19 0437  BP: 126/73  125/69 98/64  Pulse: 75 63 61 62  Resp: _0 Temp: 98.4 F (36.9 C)  98.1 F (36.7 C) 98.2 F (36.8 C)  TempSrc:   Oral Oral  SpO2: 95% 98% 96% 95%  Weight: 71.9 kg     Height: 5' 6" (1.676 m)      SpO2: 95 % O2 Flow Rate (L/min): 15 L/min   Intake/Output Summary (Last 24 hours) at 08/22/2019 4324 Last data filed at 08/21/2019 2217 Gross per 24 hour  Intake --  Output 200 ml  Net  -200 ml   Filed Weights   08/21/19 1232 08/21/19 2217  Weight: 68 kg 71.9 kg    Exam: General exam: In no acute distress. Respiratory system: Good air movement and diffuse dry crackles bilaterally. Cardiovascular system: S1 & S2 heard, RRR. No JVD. Gastrointestinal system: Abdomen is nondistended, soft and nontender.  Central nervous system: Alert and oriented. No focal neurological deficits. Extremities: No pedal edema. Skin: No rashes, lesions or ulcers Psychiatry: Judgement and insight appear normal. Mood & affect appropriate.    Data Reviewed:    Labs: Basic Metabolic Panel: Recent Labs  Lab 08/19/19 1518 08/19/19 1518 08/21/19 1350 08/22/19 0219  NA 138  --  138 141  K 3.7   < > 3.0* 4.3  CL 102  --  101 104  CO2 26  --  25 30  GLUCOSE 111*  --  135* 118*  BUN 14  --  13 10  CREATININE 0.68  --  0.68 0.53  CALCIUM 9.1  --  8.4* 8.5*  MG  --   --   --  1.6*  PHOS  --   --   --  3.2   < > = values in this interval not displayed.   GFR Estimated Creatinine Clearance: 71.2 mL/min (by C-G formula based on SCr of 0.53 mg/dL). Liver Function Tests: Recent Labs  Lab 08/19/19 1518 08/21/19 1350 08/22/19 0219  AST 45* 42* 41  ALT 44 28  24  ALKPHOS 48 49 46  BILITOT 0.8 0.7 0.5  PROT 6.7 6.4* 6.1*  ALBUMIN 3.5 3.5 3.1*   No results for input(s): LIPASE, AMYLASE in the last 168 hours. No results for input(s): AMMONIA in the last 168 hours. Coagulation profile No results for input(s): INR, PROTIME in the last 168 hours. COVID-19 Labs  Recent Labs    08/21/19 1350 08/22/19 0219  DDIMER 2.41* 2.00*  FERRITIN 561* 665*  LDH 354*  --   CRP 1.0* 4.4*    Lab Results  Component Value Date   SARSCOV2NAA NEGATIVE 07/20/2019   [place] Not Detected 01/20/2019    CBC: Recent Labs  Lab 08/19/19 1518 08/21/19 1350 08/22/19 0219  WBC 7.0 6.5 5.2  NEUTROABS 5.7 5.7 4.3  HGB 12.6 11.4* 11.0*  HCT 41.3 37.5 36.3  MCV 77.2* 76.7* 76.1*  PLT 260 217  248   Cardiac Enzymes: No results for input(s): CKTOTAL, CKMB, CKMBINDEX, TROPONINI in the last 168 hours. BNP (last 3 results) Recent Labs    01/28/19 1602  PROBNP 74.0   CBG: No results for input(s): GLUCAP in the last 168 hours. D-Dimer: Recent Labs    08/21/19 1350 08/22/19 0219  DDIMER 2.41* 2.00*   Hgb A1c: No results for input(s): HGBA1C in the last 72 hours. Lipid Profile: Recent Labs    08/21/19 1350  TRIG 162*   Thyroid function studies: No results for input(s): TSH, T4TOTAL, T3FREE, THYROIDAB in the last 72 hours.  Invalid input(s): FREET3 Anemia work up: Recent Labs    08/21/19 1350 08/22/19 0219  FERRITIN 561* 665*   Sepsis Labs: Recent Labs  Lab 08/19/19 1518 08/21/19 1350 08/21/19 1811 08/22/19 0219  PROCALCITON  --  0.47  --   --   WBC 7.0 6.5  --  5.2  LATICACIDVEN  --  1.5 1.7  --    Microbiology No results found for this or any previous visit (from the past 240 hour(s)).   Medications:   . vitamin C  500 mg Oral Daily  . azaTHIOprine  50 mg Oral TID  . dexamethasone (DECADRON) injection  6 mg Intravenous Q24H  . enoxaparin (LOVENOX) injection  40 mg Subcutaneous Q24H  . Ipratropium-Albuterol  1 puff Inhalation Q6H  . multivitamin with minerals  1 tablet Oral Daily  . pantoprazole  40 mg Oral Daily  . Pirfenidone  801 mg Oral TID  . potassium chloride  40 mEq Oral Once  . zinc sulfate  220 mg Oral Daily   Continuous Infusions: . sodium chloride    . casirivimab(REGN 10933)/imdevimab (REGN 10987) IVPB    . famotidine (PEPCID) IV    . remdesivir 100 mg in NS 100 mL        LOS: 1 day   Charlynne Cousins  [facility]  08/22/2019, 8:12 AM

## 2019-08-23 ENCOUNTER — Telehealth: Payer: Medicare HMO | Admitting: Adult Health

## 2019-08-23 LAB — MAGNESIUM: Magnesium: 2 mg/dL (ref 1.7–2.4)

## 2019-08-23 LAB — COMPREHENSIVE METABOLIC PANEL
ALT: 22 U/L (ref 0–44)
AST: 43 U/L — ABNORMAL HIGH (ref 15–41)
Albumin: 3.2 g/dL — ABNORMAL LOW (ref 3.5–5.0)
Alkaline Phosphatase: 48 U/L (ref 38–126)
Anion gap: 10 (ref 5–15)
BUN: 11 mg/dL (ref 8–23)
CO2: 28 mmol/L (ref 22–32)
Calcium: 8.8 mg/dL — ABNORMAL LOW (ref 8.9–10.3)
Chloride: 105 mmol/L (ref 98–111)
Creatinine, Ser: 0.48 mg/dL (ref 0.44–1.00)
GFR calc Af Amer: >60 mL/min (ref >60)
GFR calc non Af Amer: >60 mL/min (ref >60)
Glucose, Bld: 87 mg/dL (ref 70–99)
Potassium: 3.7 mmol/L (ref 3.5–5.1)
Sodium: 143 mmol/L (ref 135–145)
Total Bilirubin: 0.7 mg/dL (ref 0.3–1.2)
Total Protein: 6.2 g/dL — ABNORMAL LOW (ref 6.5–8.1)

## 2019-08-23 LAB — CBC WITH DIFFERENTIAL/PLATELET
Abs Immature Granulocytes: 0.03 10*3/uL (ref 0.00–0.07)
Basophils Absolute: 0 10*3/uL (ref 0.0–0.1)
Basophils Relative: 0 %
Eosinophils Absolute: 0 10*3/uL (ref 0.0–0.5)
Eosinophils Relative: 0 %
HCT: 34.8 % — ABNORMAL LOW (ref 36.0–46.0)
Hemoglobin: 11.1 g/dL — ABNORMAL LOW (ref 12.0–15.0)
Immature Granulocytes: 1 %
Lymphocytes Relative: 17 %
Lymphs Abs: 0.9 10*3/uL (ref 0.7–4.0)
MCH: 23.6 pg — ABNORMAL LOW (ref 26.0–34.0)
MCHC: 31.9 g/dL (ref 30.0–36.0)
MCV: 74 fL — ABNORMAL LOW (ref 80.0–100.0)
Monocytes Absolute: 0.3 10*3/uL (ref 0.1–1.0)
Monocytes Relative: 5 %
Neutro Abs: 3.9 10*3/uL (ref 1.7–7.7)
Neutrophils Relative %: 77 %
Platelets: 282 10*3/uL (ref 150–400)
RBC: 4.7 MIL/uL (ref 3.87–5.11)
RDW: 17.9 % — ABNORMAL HIGH (ref 11.5–15.5)
WBC: 5.1 10*3/uL (ref 4.0–10.5)
nRBC: 0 % (ref 0.0–0.2)

## 2019-08-23 LAB — FERRITIN: Ferritin: 774 ng/mL — ABNORMAL HIGH (ref 11–307)

## 2019-08-23 LAB — C-REACTIVE PROTEIN: CRP: 3.9 mg/dL — ABNORMAL HIGH (ref <1.0)

## 2019-08-23 LAB — D-DIMER, QUANTITATIVE: D-Dimer, Quant: 1.57 ug/mL-FEU — ABNORMAL HIGH (ref 0.00–0.50)

## 2019-08-23 LAB — PHOSPHORUS: Phosphorus: 2.7 mg/dL (ref 2.5–4.6)

## 2019-08-23 MED ORDER — SALINE SPRAY 0.65 % NA SOLN
1.0000 | NASAL | Status: DC | PRN
  Administered 2019-08-23: 1 via NASAL
  Filled 2019-08-23: qty 44

## 2019-08-23 MED ORDER — SODIUM CHLORIDE 0.9% IV SOLUTION: Freq: Once | INTRAVENOUS | Status: AC

## 2019-08-23 MED ORDER — TRAMADOL HCL 50 MG PO TABS
50.0000 mg | ORAL_TABLET | Freq: Four times a day (QID) | ORAL | Status: DC | PRN
  Administered 2019-08-23 – 2019-09-10 (×52): 50 mg via ORAL
  Filled 2019-08-23 (×52): qty 1

## 2019-08-23 NOTE — Plan of Care (Signed)
Went over Stacy [name] with pt and daughter per pt request. All questions asked and all questions answered.

## 2019-08-23 NOTE — Progress Notes (Signed)
Patient noted by central tele to have an increase in ST elevation after patient began to desat into the 50's. Patient placed on NRB and MD notified. Patient O2 sat returned into the 90's and MD (Opy[place]) reported to bedside. Patient denies chest pain and VSS. No further intervention at this time determined by MD. Will continue to monitor.

## 2019-08-23 NOTE — Progress Notes (Signed)
08/23/19 0706  Family/Significant Other Communication  Family/Significant Other Update Called;Updated  Called and updated family per pt request. All questions asked and all questions answered.

## 2019-08-23 NOTE — Progress Notes (Signed)
[facility] PROGRESS NOTE    Progress Note  Stacy [name]  HQI:[id number] DOB: 22-Jun-1956 DOA: 08/21/2019 PCP: Marrian Salvage, [name]     Brief Narrative:   Stacy [name] is an 64 y.o. female past medical history significant for GERD, pulmonary fibrosis on 4 L of oxygen at home on Imuran and Pirfenidone  for SARS-CoV-2 on 08/19/2019 into the ED with shortness of breath, subjective fevers, chills and nausea in the ED she was found to be satting in the low 40s placed on 4 L of oxygen and saturations improved.  Chest x-ray showed resolution of her left upper lobe pneumonia seen on previous chest x-ray.  CT angio of the chest showed no evidence of PE, did show pulmonary fibrosis with reactive lymphadenopathy.  Assessment/Plan:   Acute respiratory failure with hypoxia due to interstitial lung disease: Stacy [name] is currently requiring anywhere from 13 to 14 L of high flow nasal cannula to keep saturations greater than 92%. Continue IV remdesivir and steroids, her inflammatory markers are significantly improved. We will go ahead and give her 2 units of convalescent plasma she has agreed to it.  Explained to the patient in front of the nurse that these are blood products, the treatment plan and use of medications and known side effects were discussed with patient/family, they were clearly explained that there is no proven definitive treatment for COVID-19 infection, any medications used here are based on published clinical articles/anecdotal data which are not peer-reviewed or randomized control trials.  Complete risks and long-term side effects are unknown, however in the best clinical judgment they seem to be of some clinical benefit rather than medical risks.  Patient/family agree with the treatment plan and want to receive the given medications.  Gastroesophageal reflux disease: Continue PPI.  IPF: Resume Imuran and pirfenidone    DVT prophylaxis: lovenox Family  Communication:none Disposition Plan/Barrier to D/C:   Code Status:     Code Status Orders  (From admission, onward)         Start     Ordered   08/21/19 2224  Full code  Continuous     08/21/19 2224        Code Status History    Date Active Date Inactive Code Status Order ID Comments User Context   07/21/2019 0333 07/25/2019 1843 Full Code [id number]  Bonnell Public, MD Inpatient   Advance Care Planning Activity        IV Access:    Peripheral IV   Procedures and diagnostic studies:   CT ANGIO CHEST PE W OR WO CONTRAST  Result Date: 08/22/2019 CLINICAL DATA:  Shortness of breath, history of pulmonary fibrosis EXAM: CT ANGIOGRAPHY CHEST WITH CONTRAST TECHNIQUE: Multidetector CT imaging of the chest was performed using the standard protocol during bolus administration of intravenous contrast. Multiplanar CT image reconstructions and MIPs were obtained to evaluate the vascular anatomy. CONTRAST:  25m OMNIPAQUE IOHEXOL 350 MG/ML SOLN COMPARISON:  07/20/2019 FINDINGS: Cardiovascular: Satisfactory opacification the bilateral pulmonary arteries to the segmental level. No evidence of pulmonary embolism. Enlargement of the pulmonary arteries, suggesting pulmonary arterial hypertension. No evidence of thoracic aortic aneurysm or dissection. Atherosclerotic calcifications of the aortic arch. The heart is top-normal in size.  No pericardial effusion. Mediastinum/Nodes: Small mediastinal lymph nodes, including a dominant 12 mm short axis subcarinal node (series 5/image 77, previously 11 mm. Bilateral hilar/perihilar lymphadenopathy, measuring up to 13 mm short axis on the right, previously 12 mm. This appearance is favored to be reactive. Visualized right  thyroid is mildly enlarged/nodular. Lungs/Pleura: Residual post infectious/inflammatory scarring in the left upper lobe (series 7/image 38). Multifocal patchy/ground-glass opacities with subpleural reticulation in the lungs bilaterally,  lower lobe predominant, compatible with pulmonary fibrosis. Trace loculated pleural fluid along the left major fissure, minimally increased from the prior. No suspicious pulmonary nodules. No pneumothorax. Upper Abdomen: Visualized upper abdomen is notable for a dominant anterior right upper pole renal cyst measuring at least 6.5 cm, incompletely visualized. Musculoskeletal: Visualized osseous structures are within normal limits. Review of the MIP images confirms the above findings. IMPRESSION: No evidence of pulmonary embolism. Pulmonary fibrosis, unchanged.  Suspected reactive lymphadenopathy. Prior left upper lobe pneumonia has resolved, with mild residual post infectious/inflammatory scarring. Suspected pulmonary arterial hypertension. Aortic Atherosclerosis (ICD10-I70.0). Electronically Signed   By: Julian Hy M.D.   On: 08/22/2019 01:10   DG Chest Port 1 View  Result Date: 08/21/2019 CLINICAL DATA:  Hypoxia, COVID-19. EXAM: PORTABLE CHEST 1 VIEW COMPARISON:  Chest x-rays dated 08/19/2019 and 07/20/2019 FINDINGS: Interstitial lung opacities are again seen bilaterally, RIGHT slightly greater than LEFT, most likely related to chronic interstitial lung disease/fibrosis. The LEFT upper lobe is now clear, compatible with resolution of the previously demonstrated LEFT upper lobe pneumonia. No evidence of a new pneumonia. No pleural effusion or pneumothorax is seen. Stable cardiomegaly. IMPRESSION: 1. Resolution of the previous LEFT upper lobe pneumonia. No new lung findings. No evidence of a new pneumonia. 2. Stable interstitial lung opacities bilaterally, most likely chronic interstitial lung disease/fibrosis. 3. Stable cardiomegaly. Electronically Signed   By: Franki Cabot M.D.   On: 08/21/2019 13:22     Medical Consultants:    None.  Anti-Infectives:   IV remdesivir  Subjective:    Stacy [name] she relates her breathing is a little bit worse than yesterday.  Objective:    Vitals:    08/23/19 0250 08/23/19 0504 08/23/19 0600 08/23/19 0706  BP:  121/81  120/77  Pulse: 77 81 61 71  Resp: 19 (!) _0 Temp:  98 F (36.7 C)  98.3 F (36.8 C)  TempSrc:  Oral  Oral  SpO2: 96% 100% 95% 92%  Weight:      Height:       SpO2: 92 % O2 Flow Rate (L/min): 14 L/min   Intake/Output Summary (Last 24 hours) at 08/23/2019 0748 Last data filed at 08/23/2019 0700 Gross per 24 hour  Intake 560 ml  Output --  Net 560 ml   Filed Weights   08/21/19 1232 08/21/19 2217  Weight: 68 kg 71.9 kg    Exam: General exam: In no acute distress. Respiratory system: Good air movement and crackles bilaterally. Cardiovascular system: S1 & S2 heard, RRR. No JVD. Gastrointestinal system: Abdomen is nondistended, soft and nontender.  Extremities: No pedal edema. Skin: No rashes, lesions or ulcers  Data Reviewed:    Labs: Basic Metabolic Panel: Recent Labs  Lab 08/19/19 1518 08/19/19 1518 08/21/19 1350 08/21/19 1350 08/22/19 0219 08/23/19 0130  NA 138  --  138  --  141 143  K 3.7   < > 3.0*   < > 4.3 3.7  CL 102  --  101  --  104 105  CO2 26  --  25  --  30 28  GLUCOSE 111*  --  135*  --  118* 87  BUN 14  --  13  --  10 11  CREATININE 0.68  --  0.68  --  0.53 0.48  CALCIUM 9.1  --  8.4*  --  8.5* 8.8*  MG  --   --   --   --  1.6* 2.0  PHOS  --   --   --   --  3.2 2.7   < > = values in this interval not displayed.   GFR Estimated Creatinine Clearance: 71.2 mL/min (by C-G formula based on SCr of 0.48 mg/dL). Liver Function Tests: Recent Labs  Lab 08/19/19 1518 08/21/19 1350 08/22/19 0219 08/23/19 0130  AST 45* 42* 41 43*  ALT 44 _0 ALKPHOS 48 49 46 48  BILITOT 0.8 0.7 0.5 0.7  PROT 6.7 6.4* 6.1* 6.2*  ALBUMIN 3.5 3.5 3.1* 3.2*   No results for input(s): LIPASE, AMYLASE in the last 168 hours. No results for input(s): AMMONIA in the last 168 hours. Coagulation profile No results for input(s): INR, PROTIME in the last 168 hours. COVID-19  Labs  Recent Labs    08/21/19 1350 08/22/19 0219 08/23/19 0130  DDIMER 2.41* 2.00* 1.57*  FERRITIN 561* 665* 774*  LDH 354*  --   --   CRP 1.0* 4.4* 3.9*    Lab Results  Component Value Date   SARSCOV2NAA NEGATIVE 07/20/2019   [place] Not Detected 01/20/2019    CBC: Recent Labs  Lab 08/19/19 1518 08/21/19 1350 08/22/19 0219 08/23/19 0130  WBC 7.0 6.5 5.2 5.1  NEUTROABS 5.7 5.7 4.3 3.9  HGB 12.6 11.4* 11.0* 11.1*  HCT 41.3 37.5 36.3 34.8*  MCV 77.2* 76.7* 76.1* 74.0*  PLT 260 217 248 282   Cardiac Enzymes: No results for input(s): CKTOTAL, CKMB, CKMBINDEX, TROPONINI in the last 168 hours. BNP (last 3 results) Recent Labs    01/28/19 1602  PROBNP 74.0   CBG: No results for input(s): GLUCAP in the last 168 hours. D-Dimer: Recent Labs    08/22/19 0219 08/23/19 0130  DDIMER 2.00* 1.57*   Hgb A1c: No results for input(s): HGBA1C in the last 72 hours. Lipid Profile: Recent Labs    08/21/19 1350  TRIG 162*   Thyroid function studies: No results for input(s): TSH, T4TOTAL, T3FREE, THYROIDAB in the last 72 hours.  Invalid input(s): FREET3 Anemia work up: Recent Labs    08/22/19 0219 08/23/19 0130  FERRITIN 665* 774*   Sepsis Labs: Recent Labs  Lab 08/19/19 1518 08/21/19 1350 08/21/19 1811 08/22/19 0219 08/23/19 0130  PROCALCITON  --  0.47  --   --   --   WBC 7.0 6.5  --  5.2 5.1  LATICACIDVEN  --  1.5 1.7  --   --    Microbiology Recent Results (from the past 240 hour(s))  Blood Culture (routine x 2)     Status: None (Preliminary result)   Collection Time: 08/21/19  1:50 PM   Specimen: BLOOD  Result Value Ref Range Status   Specimen Description   Final    BLOOD RIGHT WRIST Performed at [facility] [address], [place], [place] [address]    Special Requests   Final    BOTTLES DRAWN AEROBIC AND ANAEROBIC Blood Culture adequate volume Performed at [facility] [address]  [place], [place] [address]    Culture   Final    NO GROWTH 2 DAYS Performed at [facility] [address] [place] [address]    Report Status PENDING  Incomplete  Blood Culture (routine x 2)     Status: None (Preliminary result)   Collection Time: 08/21/19  1:53 PM  Specimen: BLOOD  Result Value Ref Range Status   Specimen Description   Final    BLOOD RIGHT ANTECUBITAL Performed at [facility] [address], [place], [place] [address]    Special Requests   Final    BOTTLES DRAWN AEROBIC AND ANAEROBIC Blood Culture adequate volume Performed at [facility] [address] [place], [place] [address]    Culture   Final    NO GROWTH 2 DAYS Performed at [facility] [address], [place], [place] [address]    Report Status PENDING  Incomplete     Medications:    vitamin C  500 mg Oral Daily   dexamethasone (DECADRON) injection  6 mg Intravenous Q24H   enoxaparin (LOVENOX) injection  40 mg Subcutaneous Q24H   Ipratropium-Albuterol  1 puff Inhalation Q6H   multivitamin with minerals  1 tablet Oral Daily   pantoprazole  40 mg Oral Daily   Pirfenidone  801 mg Oral TID   potassium chloride  40 mEq Oral Once   zinc sulfate  220 mg Oral Daily   Continuous Infusions:  remdesivir 100 mg in NS 100 mL 100 mg (08/22/19 1049)      LOS: 2 days   Charlynne Cousins  [facility]  08/23/2019, 7:48 AM

## 2019-08-24 LAB — COMPREHENSIVE METABOLIC PANEL
ALT: 21 U/L (ref 0–44)
AST: 39 U/L (ref 15–41)
Albumin: 3.3 g/dL — ABNORMAL LOW (ref 3.5–5.0)
Alkaline Phosphatase: 58 U/L (ref 38–126)
Anion gap: 12 (ref 5–15)
BUN: 12 mg/dL (ref 8–23)
CO2: 29 mmol/L (ref 22–32)
Calcium: 8.6 mg/dL — ABNORMAL LOW (ref 8.9–10.3)
Chloride: 102 mmol/L (ref 98–111)
Creatinine, Ser: 0.61 mg/dL (ref 0.44–1.00)
GFR calc Af Amer: >60 mL/min (ref >60)
GFR calc non Af Amer: >60 mL/min (ref >60)
Glucose, Bld: 89 mg/dL (ref 70–99)
Potassium: 3.3 mmol/L — ABNORMAL LOW (ref 3.5–5.1)
Sodium: 143 mmol/L (ref 135–145)
Total Bilirubin: 0.7 mg/dL (ref 0.3–1.2)
Total Protein: 6.6 g/dL (ref 6.5–8.1)

## 2019-08-24 LAB — CBC WITH DIFFERENTIAL/PLATELET
Abs Immature Granulocytes: 0.02 10*3/uL (ref 0.00–0.07)
Basophils Absolute: 0 10*3/uL (ref 0.0–0.1)
Basophils Relative: 0 %
Eosinophils Absolute: 0 10*3/uL (ref 0.0–0.5)
Eosinophils Relative: 0 %
HCT: 33.9 % — ABNORMAL LOW (ref 36.0–46.0)
Hemoglobin: 10.7 g/dL — ABNORMAL LOW (ref 12.0–15.0)
Immature Granulocytes: 0 %
Lymphocytes Relative: 15 %
Lymphs Abs: 0.8 10*3/uL (ref 0.7–4.0)
MCH: 23.3 pg — ABNORMAL LOW (ref 26.0–34.0)
MCHC: 31.6 g/dL (ref 30.0–36.0)
MCV: 73.7 fL — ABNORMAL LOW (ref 80.0–100.0)
Monocytes Absolute: 0.3 10*3/uL (ref 0.1–1.0)
Monocytes Relative: 5 %
Neutro Abs: 4.6 10*3/uL (ref 1.7–7.7)
Neutrophils Relative %: 80 %
Platelets: 333 10*3/uL (ref 150–400)
RBC: 4.6 MIL/uL (ref 3.87–5.11)
RDW: 17.9 % — ABNORMAL HIGH (ref 11.5–15.5)
WBC: 5.7 10*3/uL (ref 4.0–10.5)
nRBC: 0 % (ref 0.0–0.2)

## 2019-08-24 LAB — PREPARE FRESH FROZEN PLASMA: Unit division: 0

## 2019-08-24 LAB — C-REACTIVE PROTEIN: CRP: 2.9 mg/dL — ABNORMAL HIGH (ref <1.0)

## 2019-08-24 LAB — PHOSPHORUS: Phosphorus: 3.4 mg/dL (ref 2.5–4.6)

## 2019-08-24 LAB — D-DIMER, QUANTITATIVE: D-Dimer, Quant: 1.87 ug/mL-FEU — ABNORMAL HIGH (ref 0.00–0.50)

## 2019-08-24 LAB — MAGNESIUM: Magnesium: 1.8 mg/dL (ref 1.7–2.4)

## 2019-08-24 LAB — BPAM FFP
Blood Product Expiration Date: 202102161525
Blood Product Expiration Date: 202102161525
ISSUE DATE / TIME: 202102151626
ISSUE DATE / TIME: 202102151626
Unit Type and Rh: 5100
Unit Type and Rh: 5100

## 2019-08-24 LAB — FERRITIN: Ferritin: 559 ng/mL — ABNORMAL HIGH (ref 11–307)

## 2019-08-24 MED ORDER — TOCILIZUMAB 400 MG/20ML IV SOLN: 8.0000 mg/kg | Freq: Once | INTRAVENOUS | Status: DC

## 2019-08-24 NOTE — Consult Note (Signed)
   [facility] CM Inpatient Consult   08/24/2019  Stacy [name] [birth year] [id number]   Patient screened for unplanned readmission in less than 30 days re-hospitalizations patient with [facility] in the [place] Management services are needed.  Review of patient's medical record reveals from MD progress notes of history and physical which includes but not limited to patient is: Stacy [name] is an 64 y.o. female past medical history significant for GERD, pulmonary fibrosis on 4 L of oxygen at home on Imuran and Pirfenidone for SARS-CoV-2 on 08/19/2019 into the ED with shortness of breath, subjective fevers, chills and nausea in the ED she was found to be satting in the low 40s placed on 4 L of oxygen and saturations improved.   Primary Care Provider is Marrian Salvage, NP [facility] WU:[id number] and [employer]  Plan:    Follow inpatient [facility] team. Continue to follow progress and disposition to assess for post hospital care management needs.    Please place a [facility] Care Management consult as appropriate and for questions contact:   Natividad Brood, RN BSN [facility] Liaison  [phone] business mobile phone Toll free office [phone]  Fax number: [phone] [email or web address]

## 2019-08-24 NOTE — Progress Notes (Signed)
[facility] PROGRESS NOTE    Progress Note  Stacy [name]  DGU:[id number] DOB: 1955/09/01 DOA: 08/21/2019 PCP: Marrian Salvage, [name]     Brief Narrative:   Stacy [name] is an 64 y.o. female past medical history significant for GERD, pulmonary fibrosis on 4 L of oxygen at home on Imuran and Pirfenidone  for SARS-CoV-2 on 08/19/2019 into the ED with shortness of breath, subjective fevers, chills and nausea in the ED she was found to be satting in the low 40s placed on 4 L of oxygen and saturations improved.  Chest x-ray showed resolution of her left upper lobe pneumonia seen on previous chest x-ray.  CT angio of the chest showed no evidence of PE, did show pulmonary fibrosis with reactive lymphadenopathy.  Assessment/Plan:   Acute respiratory failure with hypoxia due to interstitial lung disease: Stacy [name] is currently requiring 15 L of high flow nasal cannula to keep saturations greater than 92%. She was started on IV remdesivir and steroids, she also agreed to 2 units of convalescent plasma risk and benefits were explained, on admission her inflammatory markers were elevated now they are improved Try to keep the patient prone for early 16 hours a day, not prone out of bed to chair. We will help talk to the patient about IV Actemra. Is currently not a candidate for Actemra as she is on 2 immunosuppressive therapy at home her Imuran was held as recommended by pulmonary and critical care (Dr. Baldwin Crown)  Gastroesophageal reflux disease: Continue PPI.  IPF: Continue pirfenidone, continue to hold Imuran as per pulmonary recommendations.    DVT prophylaxis: lovenox Family Communication:none Disposition Plan/Barrier to D/C:   Code Status:     Code Status Orders  (From admission, onward)         Start     Ordered   08/21/19 2224  Full code  Continuous     08/21/19 2224        Code Status History    Date Active Date Inactive Code Status Order ID Comments User  Context   07/21/2019 0333 07/25/2019 1843 Full Code [id number]  Bonnell Public, MD Inpatient   Advance Care Planning Activity        IV Access:    Peripheral IV   Procedures and diagnostic studies:   No results found.   Medical Consultants:    None.  Anti-Infectives:   IV remdesivir  Subjective:    Stacy [name] she relates her breathing is not good today.  Objective:    Vitals:   08/24/19 0000 08/24/19 0018 08/24/19 0335 08/24/19 0700  BP: 128/72 117/85 125/84   Pulse: 71 84 98 66  Resp: (!) 30 (!) 29 (!) 23 20  Temp:  99 F (37.2 C) 98.5 F (36.9 C)   TempSrc:  Oral Oral   SpO2: 95% 90% 92% 95%  Weight:      Height:       SpO2: 95 % O2 Flow Rate (L/min): 15 L/min   Intake/Output Summary (Last 24 hours) at 08/24/2019 0755 Last data filed at 08/24/2019 0400 Gross per 24 hour  Intake 2062.23 ml  Output 200 ml  Net 1862.23 ml   Filed Weights   08/21/19 1232 08/21/19 2217  Weight: 68 kg 71.9 kg    Exam: General exam: In no acute distress. Respiratory system: Good air movement and diffuse crackles bilaterally Cardiovascular system: S1 & S2 heard, RRR. No JVD. Gastrointestinal system: Abdomen is nondistended, soft and nontender.  Extremities: No pedal edema.  Skin: No rashes, lesions or ulcers  Data Reviewed:    Labs: Basic Metabolic Panel: Recent Labs  Lab 08/19/19 1518 08/19/19 1518 08/21/19 1350 08/21/19 1350 08/22/19 0219 08/22/19 0219 08/23/19 0130 08/24/19 0352  NA 138  --  138  --  141  --  143 143  K 3.7   < > 3.0*   < > 4.3   < > 3.7 3.3*  CL 102  --  101  --  104  --  105 102  CO2 26  --  25  --  30  --  28 29  GLUCOSE 111*  --  135*  --  118*  --  87 89  BUN 14  --  13  --  10  --  11 12  CREATININE 0.68  --  0.68  --  0.53  --  0.48 0.61  CALCIUM 9.1  --  8.4*  --  8.5*  --  8.8* 8.6*  MG  --   --   --   --  1.6*  --  2.0 1.8  PHOS  --   --   --   --  3.2  --  2.7 3.4   < > = values in this interval not  displayed.   GFR Estimated Creatinine Clearance: 71.2 mL/min (by C-G formula based on SCr of 0.61 mg/dL). Liver Function Tests: Recent Labs  Lab 08/19/19 1518 08/21/19 1350 08/22/19 0219 08/23/19 0130 08/24/19 0352  AST 45* 42* 41 43* 39  ALT 44 _0 ALKPHOS 48 49 46 48 58  BILITOT 0.8 0.7 0.5 0.7 0.7  PROT 6.7 6.4* 6.1* 6.2* 6.6  ALBUMIN 3.5 3.5 3.1* 3.2* 3.3*   No results for input(s): LIPASE, AMYLASE in the last 168 hours. No results for input(s): AMMONIA in the last 168 hours. Coagulation profile No results for input(s): INR, PROTIME in the last 168 hours. COVID-19 Labs  Recent Labs    08/21/19 1350 08/21/19 1350 08/22/19 0219 08/23/19 0130 08/24/19 0352  DDIMER 2.41*   < > 2.00* 1.57* 1.87*  FERRITIN 561*   < > 665* 774* 559*  LDH 354*  --   --   --   --   CRP 1.0*   < > 4.4* 3.9* 2.9*   < > = values in this interval not displayed.    Lab Results  Component Value Date   SARSCOV2NAA NEGATIVE 07/20/2019   [place] Not Detected 01/20/2019    CBC: Recent Labs  Lab 08/19/19 1518 08/21/19 1350 08/22/19 0219 08/23/19 0130 08/24/19 0352  WBC 7.0 6.5 5.2 5.1 5.7  NEUTROABS 5.7 5.7 4.3 3.9 4.6  HGB 12.6 11.4* 11.0* 11.1* 10.7*  HCT 41.3 37.5 36.3 34.8* 33.9*  MCV 77.2* 76.7* 76.1* 74.0* 73.7*  PLT 260 217 248 282 333   Cardiac Enzymes: No results for input(s): CKTOTAL, CKMB, CKMBINDEX, TROPONINI in the last 168 hours. BNP (last 3 results) Recent Labs    01/28/19 1602  PROBNP 74.0   CBG: No results for input(s): GLUCAP in the last 168 hours. D-Dimer: Recent Labs    08/23/19 0130 08/24/19 0352  DDIMER 1.57* 1.87*   Hgb A1c: No results for input(s): HGBA1C in the last 72 hours. Lipid Profile: Recent Labs    08/21/19 1350  TRIG 162*   Thyroid function studies: No results for input(s): TSH, T4TOTAL, T3FREE, THYROIDAB in the last 72 hours.  Invalid input(s): FREET3 Anemia work up: [employer]    08/23/19  0130 08/24/19 0352    FERRITIN 774* 559*   Sepsis Labs: Recent Labs  Lab 08/21/19 1350 08/21/19 1811 08/22/19 0219 08/23/19 0130 08/24/19 0352  PROCALCITON 0.47  --   --   --   --   WBC 6.5  --  5.2 5.1 5.7  LATICACIDVEN 1.5 1.7  --   --   --    Microbiology Recent Results (from the past 240 hour(s))  Blood Culture (routine x 2)     Status: None (Preliminary result)   Collection Time: 08/21/19  1:50 PM   Specimen: BLOOD  Result Value Ref Range Status   Specimen Description   Final    BLOOD RIGHT WRIST Performed at [facility] [address] [place], [place] [address]    Special Requests   Final    BOTTLES DRAWN AEROBIC AND ANAEROBIC Blood Culture adequate volume Performed at [facility] [address] [place], [place] [address]    Culture   Final    NO GROWTH 3 DAYS Performed at [facility], [place] [address] [place], [place] [address]    Report Status PENDING  Incomplete  Blood Culture (routine x 2)     Status: None (Preliminary result)   Collection Time: 08/21/19  1:53 PM   Specimen: BLOOD  Result Value Ref Range Status   Specimen Description   Final    BLOOD RIGHT ANTECUBITAL Performed at [facility] [address], [place], [place] [address]    Special Requests   Final    BOTTLES DRAWN AEROBIC AND ANAEROBIC Blood Culture adequate volume Performed at [facility] [address] [place], [place] [address]    Culture   Final    NO GROWTH 3 DAYS Performed at [facility], [place] [address] [place], [place] [address]    Report Status PENDING  Incomplete     Medications:   . vitamin C  500 mg Oral Daily  . dexamethasone (DECADRON) injection  6 mg Intravenous Q24H  . enoxaparin (LOVENOX) injection  40 mg Subcutaneous Q24H  . Ipratropium-Albuterol  1 puff Inhalation Q6H  . multivitamin with minerals  1 tablet Oral Daily  . pantoprazole  40 mg Oral Daily  . Pirfenidone  801 mg Oral TID   . potassium chloride  40 mEq Oral Once  . zinc sulfate  220 mg Oral Daily   Continuous Infusions: . remdesivir 100 mg in NS 100 mL 100 mg (08/23/19 0943)      LOS: 3 days   Charlynne Cousins  [facility]  08/24/2019, 7:55 AM

## 2019-08-25 DIAGNOSIS — J8410 Pulmonary fibrosis, unspecified: Secondary | ICD-10-CM

## 2019-08-25 DIAGNOSIS — K219 Gastro-esophageal reflux disease without esophagitis: Secondary | ICD-10-CM

## 2019-08-25 LAB — MAGNESIUM: Magnesium: 2.1 mg/dL (ref 1.7–2.4)

## 2019-08-25 LAB — CBC WITH DIFFERENTIAL/PLATELET
Abs Immature Granulocytes: 0.05 10*3/uL (ref 0.00–0.07)
Basophils Absolute: 0 10*3/uL (ref 0.0–0.1)
Basophils Relative: 0 %
Eosinophils Absolute: 0 10*3/uL (ref 0.0–0.5)
Eosinophils Relative: 0 %
HCT: 34.4 % — ABNORMAL LOW (ref 36.0–46.0)
Hemoglobin: 11 g/dL — ABNORMAL LOW (ref 12.0–15.0)
Immature Granulocytes: 1 %
Lymphocytes Relative: 14 %
Lymphs Abs: 0.8 10*3/uL (ref 0.7–4.0)
MCH: 23.4 pg — ABNORMAL LOW (ref 26.0–34.0)
MCHC: 32 g/dL (ref 30.0–36.0)
MCV: 73.2 fL — ABNORMAL LOW (ref 80.0–100.0)
Monocytes Absolute: 0.4 10*3/uL (ref 0.1–1.0)
Monocytes Relative: 6 %
Neutro Abs: 4.8 10*3/uL (ref 1.7–7.7)
Neutrophils Relative %: 79 %
Platelets: 343 10*3/uL (ref 150–400)
RBC: 4.7 MIL/uL (ref 3.87–5.11)
RDW: 17.8 % — ABNORMAL HIGH (ref 11.5–15.5)
WBC: 6.1 10*3/uL (ref 4.0–10.5)
nRBC: 0 % (ref 0.0–0.2)

## 2019-08-25 LAB — COMPREHENSIVE METABOLIC PANEL
ALT: 19 U/L (ref 0–44)
AST: 33 U/L (ref 15–41)
Albumin: 3.3 g/dL — ABNORMAL LOW (ref 3.5–5.0)
Alkaline Phosphatase: 59 U/L (ref 38–126)
Anion gap: 11 (ref 5–15)
BUN: 17 mg/dL (ref 8–23)
CO2: 29 mmol/L (ref 22–32)
Calcium: 8.8 mg/dL — ABNORMAL LOW (ref 8.9–10.3)
Chloride: 102 mmol/L (ref 98–111)
Creatinine, Ser: 0.41 mg/dL — ABNORMAL LOW (ref 0.44–1.00)
GFR calc Af Amer: >60 mL/min (ref >60)
GFR calc non Af Amer: >60 mL/min (ref >60)
Glucose, Bld: 99 mg/dL (ref 70–99)
Potassium: 3.8 mmol/L (ref 3.5–5.1)
Sodium: 142 mmol/L (ref 135–145)
Total Bilirubin: 0.7 mg/dL (ref 0.3–1.2)
Total Protein: 6.6 g/dL (ref 6.5–8.1)

## 2019-08-25 LAB — PHOSPHORUS: Phosphorus: 3.6 mg/dL (ref 2.5–4.6)

## 2019-08-25 LAB — C-REACTIVE PROTEIN: CRP: 3.9 mg/dL — ABNORMAL HIGH (ref <1.0)

## 2019-08-25 LAB — FERRITIN: Ferritin: 355 ng/mL — ABNORMAL HIGH (ref 11–307)

## 2019-08-25 LAB — D-DIMER, QUANTITATIVE: D-Dimer, Quant: 4.5 ug/mL-FEU — ABNORMAL HIGH (ref 0.00–0.50)

## 2019-08-25 MED ORDER — IPRATROPIUM-ALBUTEROL 20-100 MCG/ACT IN AERS
1.0000 | INHALATION_SPRAY | Freq: Four times a day (QID) | RESPIRATORY_TRACT | Status: DC
  Administered 2019-08-25 – 2019-09-10 (×62): 1 via RESPIRATORY_TRACT
  Filled 2019-08-25: qty 4

## 2019-08-25 MED ORDER — FUROSEMIDE 10 MG/ML IJ SOLN
40.0000 mg | Freq: Once | INTRAMUSCULAR | Status: DC
  Filled 2019-08-25: qty 4

## 2019-08-25 NOTE — Plan of Care (Signed)
  Problem: Education: Goal: Knowledge of risk factors and measures for prevention of condition will improve Outcome: Progressing   Problem: Coping: Goal: Psychosocial and spiritual needs will be supported Outcome: Progressing   Problem: Respiratory: Goal: Will maintain a patent airway Outcome: Progressing Goal: Complications related to the disease process, condition or treatment will be avoided or minimized Outcome: Progressing   Problem: Clinical Measurements: Goal: Will remain free from infection Outcome: Progressing   Problem: Activity: Goal: Risk for activity intolerance will decrease Outcome: Progressing   Problem: Safety: Goal: Ability to remain free from injury will improve Outcome: Progressing   Problem: Skin Integrity: Goal: Risk for impaired skin integrity will decrease Outcome: Progressing

## 2019-08-25 NOTE — Progress Notes (Signed)
PROGRESS NOTE    Stacy [name]  OHY:[id number] DOB: [birth year] DOA: 08/21/2019 PCP: Marrian Salvage, [name]   Brief Narrative:  64 year old with history of GERD, pulmonary fibrosis with chronic hypoxia on 4 L nasal cannula at home on Imuran and pirfenidone presented to the ED with shortness of breath on 2/11 found to have COVID-19 pneumonia.  CTA chest showed pulmonary fibrosis with reactive lymphadenopathy but no evidence of pulmonary embolism.  Started on remdesivir, steroids.   Assessment & Plan:   Active Problems:   Pulmonary fibrosis (HCC)   ILD (interstitial lung disease) (HCC)   Gastroesophageal reflux disease   Pneumonia due to COVID-19 virus   Acute respiratory failure with hypoxia (HCC)  Acute on chronic hypoxic respiratory failure secondary to COVID-19 pneumonia Pulmonary interstitial fibrosis -Oxygen levels- 15L HF. On 4L [place] at home.  -Remdesivir- D5 -Decadrone- D5 -Actemra- Not a candidate -Convalescent plasma- 2U 2/15 -Antibiotics- None -procalcitonin- 0.47; BNP 32 on admission -Lasix= 63m IV (2/17) -CTA 2/14= Neg for PE; Pulm Fibrosis with Reactive LN.  -Supportive care-antitussive, inhalers, I-S/flutter -CODE STATUS confirmed-she wishes to be DNI -Vitamin C & Zinc. Prone >16hrs/day.  -Routine: Labs have been reviewed including ferritin, LDH, CRP, d-dimer, fibrinogen.  Will need to trend this lab daily. -Holding Imuran. Cont Pirfenidone TID  GERD -PPI  DVT prophylaxis: Lovenox  Code Status: Limited code-DNI Family Communication:  CHarrison Mons her daughter.  Disposition Plan:   Patient From= Home  Patient Anticipated D/C place= Home  Barriers= persistently significantly hypoxic.  Unsafe for discharge until this is resolved.   Subjective: Very dyspneic with minimal exertion and even after using incentive spirometer and flutter valve.  Significant amount of coughing after using it and desaturates down to 50% with quick recovery up to 85% on 15 L  nasal cannula.  At rest she feels okay.  She expressed to me that she does not want to be intubated in case of emergency otherwise okay to perform rest of the resuscitative measures.  RN at bedside during my interaction with her.  Review of Systems Otherwise negative except as per HPI, including: General: Denies fever, chills, night sweats or unintended weight loss. Resp: Denies hemoptysis Cardiac: Denies chest pain, palpitations, orthopnea, paroxysmal nocturnal dyspnea. GI: Denies abdominal pain, nausea, vomiting, diarrhea or constipation GU: Denies dysuria, frequency, hesitancy or incontinence MS: Denies muscle aches, joint pain or swelling Neuro: Denies headache, neurologic deficits (focal weakness, numbness, tingling), abnormal gait Psych: Denies anxiety, depression, SI/HI/AVH Skin: Denies new rashes or lesions ID: Denies sick contacts, exotic exposures, travel  Examination:  General exam: Appears calm and comfortable; 15L HF with NRB.  Respiratory system: Diffuse Rhonchi.  Cardiovascular system: S1 & S2 heard, RRR. No JVD, murmurs, rubs, gallops or clicks. No pedal edema. Gastrointestinal system: Abdomen is nondistended, soft and nontender. No organomegaly or masses felt. Normal bowel sounds heard. Central nervous system: Alert and oriented. No focal neurological deficits. Extremities: Symmetric 5 x 5 power. Skin: No rashes, lesions or ulcers Psychiatry: Judgement and insight appear normal. Mood & affect appropriate.     Objective: Vitals:   08/25/19 0012 08/25/19 0423 08/25/19 0426 08/25/19 0722  BP: 122/75  (!) 142/78 (!) 151/86  Pulse: 60  68 72  Resp: (!) 21  (!) 23 (!) 24  Temp: 97.8 F (36.6 C) 97.9 F (36.6 C) 97.9 F (36.6 C) 97.6 F (36.4 C)  TempSrc: Axillary  Oral Axillary  SpO2: 95%  92% 95%  Weight:      Height:  Intake/Output Summary (Last 24 hours) at 08/25/2019 0800 Last data filed at 08/24/2019 1940 Gross per 24 hour  Intake 840 ml  Output  --  Net 840 ml   Filed Weights   08/21/19 1232 08/21/19 2217  Weight: 68 kg 71.9 kg     Data Reviewed:   CBC: Recent Labs  Lab 08/21/19 1350 08/22/19 0219 08/23/19 0130 08/24/19 0352 08/25/19 0155  WBC 6.5 5.2 5.1 5.7 6.1  NEUTROABS 5.7 4.3 3.9 4.6 4.8  HGB 11.4* 11.0* 11.1* 10.7* 11.0*  HCT 37.5 36.3 34.8* 33.9* 34.4*  MCV 76.7* 76.1* 74.0* 73.7* 73.2*  PLT 217 248 282 333 080   Basic Metabolic Panel: Recent Labs  Lab 08/21/19 1350 08/22/19 0219 08/23/19 0130 08/24/19 0352 08/25/19 0155  NA 138 141 143 143 142  K 3.0* 4.3 3.7 3.3* 3.8  CL 101 104 105 102 102  CO2 _0 GLUCOSE 135* 118* 87 89 99  BUN _1 CREATININE 0.68 0.53 0.48 0.61 0.41*  CALCIUM 8.4* 8.5* 8.8* 8.6* 8.8*  MG  --  1.6* 2.0 1.8 2.1  PHOS  --  3.2 2.7 3.4 3.6   GFR: Estimated Creatinine Clearance: 71.2 mL/min (A) (by C-G formula based on SCr of 0.41 mg/dL (L)). Liver Function Tests: Recent Labs  Lab 08/21/19 1350 08/22/19 0219 08/23/19 0130 08/24/19 0352 08/25/19 0155  AST 42* 41 43* 39 33  ALT _2 ALKPHOS 49 46 48 58 59  BILITOT 0.7 0.5 0.7 0.7 0.7  PROT 6.4* 6.1* 6.2* 6.6 6.6  ALBUMIN 3.5 3.1* 3.2* 3.3* 3.3*   No results for input(s): LIPASE, AMYLASE in the last 168 hours. No results for input(s): AMMONIA in the last 168 hours. Coagulation Profile: No results for input(s): INR, PROTIME in the last 168 hours. Cardiac Enzymes: No results for input(s): CKTOTAL, CKMB, CKMBINDEX, TROPONINI in the last 168 hours. BNP (last 3 results) Recent Labs    01/28/19 1602  PROBNP 74.0   HbA1C: No results for input(s): HGBA1C in the last 72 hours. CBG: No results for input(s): GLUCAP in the last 168 hours. Lipid Profile: No results for input(s): CHOL, HDL, LDLCALC, TRIG, CHOLHDL, LDLDIRECT in the last 72 hours. Thyroid Function Tests: No results for input(s): TSH, T4TOTAL, FREET4, T3FREE, THYROIDAB in the last 72 hours. Anemia Panel: Recent Labs     08/24/19 0352 08/25/19 0155  FERRITIN 559* 355*   Sepsis Labs: Recent Labs  Lab 08/21/19 1350 08/21/19 1811  PROCALCITON 0.47  --   LATICACIDVEN 1.5 1.7    Recent Results (from the past 240 hour(s))  Blood Culture (routine x 2)     Status: None (Preliminary result)   Collection Time: 08/21/19  1:50 PM   Specimen: BLOOD  Result Value Ref Range Status   Specimen Description   Final    BLOOD RIGHT WRIST Performed at [facility] [address], [place], [place] [address]    Special Requests   Final    BOTTLES DRAWN AEROBIC AND ANAEROBIC Blood Culture adequate volume Performed at [facility] [address], [place], [place] [address]    Culture   Final    NO GROWTH 4 DAYS Performed at [facility], [place] [address], [place],  [address]    Report Status PENDING  Incomplete  Blood Culture (routine x 2)     Status: None (Preliminary result)   Collection Time: 08/21/19  1:53  PM   Specimen: BLOOD  Result Value Ref Range Status   Specimen Description   Final    BLOOD RIGHT ANTECUBITAL Performed at [facility] [address] [place], [place] [address]    Special Requests   Final    BOTTLES DRAWN AEROBIC AND ANAEROBIC Blood Culture adequate volume Performed at [facility] [address], [place], [place] [address]    Culture   Final    NO GROWTH 4 DAYS Performed at [facility], [place] [address] [place] [address] [place], [place] [address]    Report Status PENDING  Incomplete         Radiology Studies: No results found.      Scheduled Meds: . vitamin C  500 mg Oral Daily  . dexamethasone (DECADRON) injection  6 mg Intravenous Q24H  . enoxaparin (LOVENOX) injection  40 mg Subcutaneous Q24H  . Ipratropium-Albuterol  1 puff Inhalation Q6H  . multivitamin with minerals  1 tablet Oral Daily  . pantoprazole  40 mg Oral Daily  . Pirfenidone  801 mg Oral TID  . potassium chloride  40  mEq Oral Once  . zinc sulfate  220 mg Oral Daily   Continuous Infusions: . remdesivir 100 mg in NS 100 mL Stopped (08/24/19 1300)     LOS: 4 days   Time spent= 35 mins    Labrian Torregrossa Arsenio Loader, MD [facility]  If 7PM-7AM, please contact night-coverage  08/25/2019, 8:00 AM

## 2019-08-26 LAB — COMPREHENSIVE METABOLIC PANEL
ALT: 20 U/L (ref 0–44)
AST: 31 U/L (ref 15–41)
Albumin: 3.3 g/dL — ABNORMAL LOW (ref 3.5–5.0)
Alkaline Phosphatase: 68 U/L (ref 38–126)
Anion gap: 12 (ref 5–15)
BUN: 13 mg/dL (ref 8–23)
CO2: 29 mmol/L (ref 22–32)
Calcium: 8.8 mg/dL — ABNORMAL LOW (ref 8.9–10.3)
Chloride: 101 mmol/L (ref 98–111)
Creatinine, Ser: 0.52 mg/dL (ref 0.44–1.00)
GFR calc Af Amer: >60 mL/min (ref >60)
GFR calc non Af Amer: >60 mL/min (ref >60)
Glucose, Bld: 110 mg/dL — ABNORMAL HIGH (ref 70–99)
Potassium: 3.5 mmol/L (ref 3.5–5.1)
Sodium: 142 mmol/L (ref 135–145)
Total Bilirubin: 0.8 mg/dL (ref 0.3–1.2)
Total Protein: 6.7 g/dL (ref 6.5–8.1)

## 2019-08-26 LAB — CBC WITH DIFFERENTIAL/PLATELET
Abs Immature Granulocytes: 0.07 10*3/uL (ref 0.00–0.07)
Basophils Absolute: 0 10*3/uL (ref 0.0–0.1)
Basophils Relative: 1 %
Eosinophils Absolute: 0 10*3/uL (ref 0.0–0.5)
Eosinophils Relative: 0 %
HCT: 37.1 % (ref 36.0–46.0)
Hemoglobin: 11.4 g/dL — ABNORMAL LOW (ref 12.0–15.0)
Immature Granulocytes: 1 %
Lymphocytes Relative: 16 %
Lymphs Abs: 1.4 10*3/uL (ref 0.7–4.0)
MCH: 22.8 pg — ABNORMAL LOW (ref 26.0–34.0)
MCHC: 30.7 g/dL (ref 30.0–36.0)
MCV: 74.2 fL — ABNORMAL LOW (ref 80.0–100.0)
Monocytes Absolute: 0.5 10*3/uL (ref 0.1–1.0)
Monocytes Relative: 6 %
Neutro Abs: 6.6 10*3/uL (ref 1.7–7.7)
Neutrophils Relative %: 76 %
Platelets: 368 10*3/uL (ref 150–400)
RBC: 5 MIL/uL (ref 3.87–5.11)
RDW: 17.8 % — ABNORMAL HIGH (ref 11.5–15.5)
WBC: 8.6 10*3/uL (ref 4.0–10.5)
nRBC: 0 % (ref 0.0–0.2)

## 2019-08-26 LAB — BRAIN NATRIURETIC PEPTIDE: B Natriuretic Peptide: 45.3 pg/mL (ref 0.0–100.0)

## 2019-08-26 LAB — CULTURE, BLOOD (ROUTINE X 2)
Culture: NO GROWTH
Culture: NO GROWTH
Special Requests: ADEQUATE
Special Requests: ADEQUATE

## 2019-08-26 LAB — PROCALCITONIN: Procalcitonin: 0.1 ng/mL

## 2019-08-26 LAB — MAGNESIUM: Magnesium: 1.8 mg/dL (ref 1.7–2.4)

## 2019-08-26 LAB — D-DIMER, QUANTITATIVE: D-Dimer, Quant: 19.48 ug/mL-FEU — ABNORMAL HIGH (ref 0.00–0.50)

## 2019-08-26 LAB — PHOSPHORUS: Phosphorus: 2.7 mg/dL (ref 2.5–4.6)

## 2019-08-26 LAB — FERRITIN: Ferritin: 333 ng/mL — ABNORMAL HIGH (ref 11–307)

## 2019-08-26 LAB — C-REACTIVE PROTEIN: CRP: 3.2 mg/dL — ABNORMAL HIGH (ref <1.0)

## 2019-08-26 MED ORDER — ENOXAPARIN SODIUM 40 MG/0.4ML ~~LOC~~ SOLN
40.0000 mg | Freq: Two times a day (BID) | SUBCUTANEOUS | Status: DC
  Administered 2019-08-26 – 2019-08-27 (×3): 40 mg via SUBCUTANEOUS
  Filled 2019-08-26 (×3): qty 0.4

## 2019-08-26 MED ORDER — POTASSIUM CHLORIDE CRYS ER 20 MEQ PO TBCR
40.0000 meq | EXTENDED_RELEASE_TABLET | Freq: Once | ORAL | Status: AC
  Administered 2019-08-26: 40 meq via ORAL
  Filled 2019-08-26: qty 2

## 2019-08-26 NOTE — Plan of Care (Signed)
  Problem: Education: Goal: Knowledge of risk factors and measures for prevention of condition will improve Outcome: Progressing   Problem: Coping: Goal: Psychosocial and spiritual needs will be supported Outcome: Progressing   Problem: Respiratory: Goal: Will maintain a patent airway Outcome: Progressing Goal: Complications related to the disease process, condition or treatment will be avoided or minimized Outcome: Progressing   Problem: Clinical Measurements: Goal: Will remain free from infection Outcome: Progressing   Problem: Activity: Goal: Risk for activity intolerance will decrease Outcome: Progressing   Problem: Safety: Goal: Ability to remain free from injury will improve Outcome: Progressing   Problem: Skin Integrity: Goal: Risk for impaired skin integrity will decrease Outcome: Progressing

## 2019-08-26 NOTE — Progress Notes (Signed)
PROGRESS NOTE    Stacy [name]  ION:[id number] DOB: [birth year] DOA: 08/21/2019 PCP: Marrian Salvage, [name]   Brief Narrative:  64 year old with history of GERD, pulmonary fibrosis with chronic hypoxia on 4 L nasal cannula at home on Imuran and pirfenidone presented to the ED with shortness of breath on 2/11 found to have COVID-19 pneumonia.  CTA chest showed pulmonary fibrosis with reactive lymphadenopathy but no evidence of pulmonary embolism.  Completed course of remdesivir 2/17 and received convalescent plasma 2/15.  Not a candidate for Actemra as patient was on Imuran as outpatient.   Assessment & Plan:   Active Problems:   Pulmonary fibrosis (HCC)   ILD (interstitial lung disease) (HCC)   Gastroesophageal reflux disease   Pneumonia due to COVID-19 virus   Acute respiratory failure with hypoxia (HCC)  Acute on chronic hypoxic respiratory failure secondary to COVID-19 pneumonia, persistent Pulmonary interstitial fibrosis -Oxygen levels-15 L high flow along with intermittent nonrebreather.  4 L oxygen at home -Remdesivir-completed 2/17 -Decadrone-day 6 -Actemra- Not a candidate.  Was on Imuran at home. -Convalescent plasma- 2U 2/15 -Antibiotics- None -procalcitonin-negative.  BNP 45. -Elevated D-dimer, CTA 2/14= Neg for PE; Pulm Fibrosis with Reactive LN.  -Supportive care-antitussive, inhalers, I-S/flutter -CODE STATUS confirmed-she wishes to be DNI -Vitamin C & Zinc. Prone >16hrs/day.  -Routine: Labs have been reviewed including ferritin, LDH, CRP, d-dimer, fibrinogen.  Will need to trend this lab daily. -Holding Imuran. Cont Pirfenidone TID  GERD -PPI  DVT prophylaxis: Lovenox  Code Status: Limited code-DNI Family Communication:  Stacy [name] Updated.  Disposition Plan:   Patient From= Home  Patient Anticipated D/C place= Home  Barriers= persistently significantly hypoxic.  She is quite ill at this time.  Unsafe for discharge until this is  resolved.   Subjective: Patient still dyspneic with minimal exertion.  After using incentive spirometer while I was in the room, she went into a coughing spell when she she desaturated down to 75% on 15 L of high flow nasal cannula and became tachycardic with heart rate of 120.  After calming down she started feeling better but still was quite hypoxic.  Review of Systems Otherwise negative except as per HPI, including: General = no fevers, chills, dizziness,  fatigue HEENT/EYES = negative for loss of vision, double vision, blurred vision,  sore throa Cardiovascular= negative for chest pain, palpitation Respiratory/lungs= negative for hemoptysis,  Gastrointestinal= negative for nausea, vomiting, abdominal pain Genitourinary= negative for Dysuria MSK = Negative for arthralgia, myalgias Neurology= Negative for headache, numbness, tingling  Psychiatry= Negative for suicidal and homocidal ideation Skin= Negative for Rash   Examination: Constitutional: Mild distress due to shortness of breath, on 15 L high flow nasal cannula. Respiratory: Diffuse rhonchi bilaterally Cardiovascular: Normal sinus rhythm, no rubs Abdomen: Nontender nondistended good bowel sounds Musculoskeletal: No edema noted Skin: No rashes seen Neurologic: CN 2-12 grossly intact.  And nonfocal Psychiatric: Normal judgment and insight. Alert and oriented x 3. Normal mood.      Objective: Vitals:   08/25/19 1935 08/26/19 0000 08/26/19 0300 08/26/19 0734  BP: (!) 149/98 139/84 140/85 (!) 162/91  Pulse: 97 97 69 99  Resp: (!) 25 (!) 24 19 (!) 41  Temp: 97.8 F (36.6 C) (!) 97.5 F (36.4 C) 98.7 F (37.1 C) 98 F (36.7 C)  TempSrc: Axillary Oral Axillary Axillary  SpO2: 94% 98% 99% 92%  Weight:      Height:        Intake/Output Summary (Last 24 hours) at 08/26/2019 0754 Last data  filed at 08/25/2019 1935 Gross per 24 hour  Intake 510 ml  Output 100 ml  Net 410 ml   Filed Weights   08/21/19 1232 08/21/19  2217  Weight: 68 kg 71.9 kg     Data Reviewed:   CBC: Recent Labs  Lab 08/22/19 0219 08/23/19 0130 08/24/19 0352 08/25/19 0155 08/26/19 0155  WBC 5.2 5.1 5.7 6.1 8.6  NEUTROABS 4.3 3.9 4.6 4.8 6.6  HGB 11.0* 11.1* 10.7* 11.0* 11.4*  HCT 36.3 34.8* 33.9* 34.4* 37.1  MCV 76.1* 74.0* 73.7* 73.2* 74.2*  PLT 248 282 333 343 643   Basic Metabolic Panel: Recent Labs  Lab 08/22/19 0219 08/23/19 0130 08/24/19 0352 08/25/19 0155 08/26/19 0155  NA 141 143 143 142 142  K 4.3 3.7 3.3* 3.8 3.5  CL 104 105 102 102 101  CO2 _0 GLUCOSE 118* 87 89 99 110*  BUN _1 CREATININE 0.53 0.48 0.61 0.41* 0.52  CALCIUM 8.5* 8.8* 8.6* 8.8* 8.8*  MG 1.6* 2.0 1.8 2.1 1.8  PHOS 3.2 2.7 3.4 3.6 2.7   GFR: Estimated Creatinine Clearance: 71.2 mL/min (by C-G formula based on SCr of 0.52 mg/dL). Liver Function Tests: Recent Labs  Lab 08/22/19 0219 08/23/19 0130 08/24/19 0352 08/25/19 0155 08/26/19 0155  AST 41 43* 39 33 31  ALT _2 ALKPHOS 46 48 58 59 68  BILITOT 0.5 0.7 0.7 0.7 0.8  PROT 6.1* 6.2* 6.6 6.6 6.7  ALBUMIN 3.1* 3.2* 3.3* 3.3* 3.3*   No results for input(s): LIPASE, AMYLASE in the last 168 hours. No results for input(s): AMMONIA in the last 168 hours. Coagulation Profile: No results for input(s): INR, PROTIME in the last 168 hours. Cardiac Enzymes: No results for input(s): CKTOTAL, CKMB, CKMBINDEX, TROPONINI in the last 168 hours. BNP (last 3 results) Recent Labs    01/28/19 1602  PROBNP 74.0   HbA1C: No results for input(s): HGBA1C in the last 72 hours. CBG: No results for input(s): GLUCAP in the last 168 hours. Lipid Profile: No results for input(s): CHOL, HDL, LDLCALC, TRIG, CHOLHDL, LDLDIRECT in the last 72 hours. Thyroid Function Tests: No results for input(s): TSH, T4TOTAL, FREET4, T3FREE, THYROIDAB in the last 72 hours. Anemia Panel: Recent Labs    08/25/19 0155 08/26/19 0155  FERRITIN 355* 333*   Sepsis  Labs: Recent Labs  Lab 08/21/19 1350 08/21/19 1811 08/26/19 0155  PROCALCITON 0.47  --  0.10  LATICACIDVEN 1.5 1.7  --     Recent Results (from the past 240 hour(s))  Blood Culture (routine x 2)     Status: None   Collection Time: 08/21/19  1:50 PM   Specimen: BLOOD  Result Value Ref Range Status   Specimen Description   Final    BLOOD RIGHT WRIST Performed at [facility] [address] [place], [place] [address]    Special Requests   Final    BOTTLES DRAWN AEROBIC AND ANAEROBIC Blood Culture adequate volume Performed at [facility] [address], [place], [place] [address]    Culture   Final    NO GROWTH 5 DAYS Performed at [facility], [place] [address] [place], [place] [address]    Report Status 08/26/2019 FINAL  Final  Blood Culture (routine x 2)     Status: None   Collection Time: 08/21/19  1:53 PM   Specimen: BLOOD  Result Value Ref Range Status  Specimen Description   Final    BLOOD RIGHT ANTECUBITAL Performed at [facility] [address] [place], [place] [address]    Special Requests   Final    BOTTLES DRAWN AEROBIC AND ANAEROBIC Blood Culture adequate volume Performed at [facility] [address] [place], [place] [address]    Culture   Final    NO GROWTH 5 DAYS Performed at [facility], [place] [address], [place], [place] [address]    Report Status 08/26/2019 FINAL  Final         Radiology Studies: No results found.      Scheduled Meds: . vitamin C  500 mg Oral Daily  . dexamethasone (DECADRON) injection  6 mg Intravenous Q24H  . enoxaparin (LOVENOX) injection  40 mg Subcutaneous Q24H  . furosemide  40 mg Intravenous Once  . Ipratropium-Albuterol  1 puff Inhalation Q6H  . multivitamin with minerals  1 tablet Oral Daily  . pantoprazole  40 mg Oral Daily  . Pirfenidone  801 mg Oral TID  . potassium chloride  40 mEq Oral Once  . potassium  chloride  40 mEq Oral Once  . zinc sulfate  220 mg Oral Daily   Continuous Infusions:    LOS: 5 days   Time spent= 35 mins    Emberli Ballester Arsenio Loader, MD [facility]  If 7PM-7AM, please contact night-coverage  08/26/2019, 7:54 AM

## 2019-08-26 NOTE — Progress Notes (Signed)
08/26/19 1200  Family/Significant Other Communication  Family/Significant Other Update Called;Updated (spoke with daughter, Angela Nevin)

## 2019-08-27 ENCOUNTER — Inpatient Hospital Stay (HOSPITAL_COMMUNITY): Payer: Medicare HMO

## 2019-08-27 ENCOUNTER — Telehealth: Payer: Self-pay | Admitting: Pulmonary Disease

## 2019-08-27 DIAGNOSIS — M7989 Other specified soft tissue disorders: Secondary | ICD-10-CM

## 2019-08-27 LAB — BASIC METABOLIC PANEL
Anion gap: 10 (ref 5–15)
BUN: 16 mg/dL (ref 8–23)
CO2: 30 mmol/L (ref 22–32)
Calcium: 9.2 mg/dL (ref 8.9–10.3)
Chloride: 103 mmol/L (ref 98–111)
Creatinine, Ser: 0.58 mg/dL (ref 0.44–1.00)
GFR calc Af Amer: >60 mL/min (ref >60)
GFR calc non Af Amer: >60 mL/min (ref >60)
Glucose, Bld: 104 mg/dL — ABNORMAL HIGH (ref 70–99)
Potassium: 3.8 mmol/L (ref 3.5–5.1)
Sodium: 143 mmol/L (ref 135–145)

## 2019-08-27 LAB — D-DIMER, QUANTITATIVE: D-Dimer, Quant: 11.07 ug/mL-FEU — ABNORMAL HIGH (ref 0.00–0.50)

## 2019-08-27 LAB — CBC
HCT: 37.3 % (ref 36.0–46.0)
Hemoglobin: 11.7 g/dL — ABNORMAL LOW (ref 12.0–15.0)
MCH: 23.1 pg — ABNORMAL LOW (ref 26.0–34.0)
MCHC: 31.4 g/dL (ref 30.0–36.0)
MCV: 73.7 fL — ABNORMAL LOW (ref 80.0–100.0)
Platelets: 390 10*3/uL (ref 150–400)
RBC: 5.06 MIL/uL (ref 3.87–5.11)
RDW: 17.9 % — ABNORMAL HIGH (ref 11.5–15.5)
WBC: 8.7 10*3/uL (ref 4.0–10.5)
nRBC: 0 % (ref 0.0–0.2)

## 2019-08-27 LAB — MAGNESIUM: Magnesium: 2 mg/dL (ref 1.7–2.4)

## 2019-08-27 MED ORDER — FUROSEMIDE 20 MG PO TABS
40.0000 mg | ORAL_TABLET | Freq: Once | ORAL | Status: AC
  Administered 2019-08-27: 40 mg via ORAL
  Filled 2019-08-27: qty 2

## 2019-08-27 MED ORDER — ENOXAPARIN SODIUM 80 MG/0.8ML ~~LOC~~ SOLN
1.0000 mg/kg | Freq: Two times a day (BID) | SUBCUTANEOUS | Status: DC
  Administered 2019-08-27 – 2019-09-02 (×12): 70 mg via SUBCUTANEOUS
  Filled 2019-08-27 (×12): qty 0.8

## 2019-08-27 MED ORDER — BUDESONIDE 180 MCG/ACT IN AEPB
1.0000 | INHALATION_SPRAY | Freq: Two times a day (BID) | RESPIRATORY_TRACT | Status: DC
  Administered 2019-08-27 – 2019-09-10 (×28): 1 via RESPIRATORY_TRACT
  Filled 2019-08-27 (×2): qty 1

## 2019-08-27 MED ORDER — POTASSIUM CHLORIDE CRYS ER 20 MEQ PO TBCR
20.0000 meq | EXTENDED_RELEASE_TABLET | Freq: Once | ORAL | Status: AC
  Administered 2019-08-27: 09:00:00 20 meq via ORAL
  Filled 2019-08-27: qty 1

## 2019-08-27 MED ORDER — ENOXAPARIN SODIUM 30 MG/0.3ML ~~LOC~~ SOLN
30.0000 mg | Freq: Once | SUBCUTANEOUS | Status: AC
  Administered 2019-08-27: 13:00:00 30 mg via SUBCUTANEOUS
  Filled 2019-08-27: qty 0.3

## 2019-08-27 NOTE — Progress Notes (Signed)
Bilateral lower extremity venous duplex has been completed. Preliminary results can be found in CV Proc through chart review.  Results were given to the patient's nurse, Gearlean Alf.   08/27/19 11:23 AM Carlos Levering RVT

## 2019-08-27 NOTE — Progress Notes (Addendum)
PROGRESS NOTE    Stacy [name]  UGQ:[id number] DOB: [birth year] DOA: 08/21/2019 PCP: Stacy Salvage, [name]   Brief Narrative:  64 year old with history of GERD, pulmonary fibrosis with chronic hypoxia on 4 L nasal cannula at home on Imuran and pirfenidone presented to the ED with shortness of breath on 2/11 found to have COVID-19 pneumonia.  CTA chest showed pulmonary fibrosis with reactive lymphadenopathy but no evidence of pulmonary embolism.  Completed course of remdesivir 2/17 and received convalescent plasma 2/15.  Not a candidate for Actemra as patient was on Imuran as outpatient.   Assessment & Plan:   Active Problems:   Pulmonary fibrosis (HCC)   ILD (interstitial lung disease) (HCC)   Gastroesophageal reflux disease   Pneumonia due to COVID-19 virus   Acute respiratory failure with hypoxia (HCC)  Acute on chronic hypoxic respiratory failure secondary to COVID-19 pneumonia, persistent Pulmonary interstitial fibrosis -Oxygen levels-Still on 15L HF with NRB. On 4L At home.  -Remdesivir-completed 2/17 -Decadrone-day 7 -Actemra- Not a candidate.  Was on Imuran at home. -Convalescent plasma- 2U 2/15 -Antibiotics- None -procalcitonin-negative.  BNP 45. -Elevated D-dimer, Lovenox BID. Lower Ext Dopplers ordered.  -CTA 2/14= Neg for PE; Pulm Fibrosis with Reactive LN.  -Supportive care-antitussive, inhalers, I-S/flutter -Vitamin C & Zinc. Prone >16hrs/day.  -Routine: Labs have been reviewed including ferritin, LDH, CRP, d-dimer, fibrinogen.  Will need to trend this lab daily. -Holding Imuran. Cont Pirfenidone TID  Addendum 1210pm: RLE DVT seen on PreLim Dopplers. Change lovenox to 77m/Kg q12hrs.   GERD -PPI  DVT prophylaxis: Lovenox BID  Code Status: Limited code-DNI Family Communication:  Daughter updated.  Disposition Plan:   Patient From= Home  Patient Anticipated D/C place= Home  Barriers= persistently significantly hypoxic.  She is quite ill at this time.   Unsafe for discharge until this is resolved.  Radiology Studies: CTA 2/14= Neg for PE; Pulm Fibrosis with Reactive LN.    Subjective: Still short of breath with minimal exertion. Coughing spells with use of IS. Continues to be on 15L HF Stacy [name].   Review of Systems Otherwise negative except as per HPI, including: General = no fevers, chills, dizziness,  fatigue HEENT/EYES = negative for loss of vision, double vision, blurred vision,  sore throa Cardiovascular= negative for chest pain, palpitation Respiratory/lungs= negative for hemoptysis,  Gastrointestinal= negative for nausea, vomiting, abdominal pain Genitourinary= negative for Dysuria MSK = Negative for arthralgia, myalgias Neurology= Negative for headache, numbness, tingling  Psychiatry= Negative for suicidal and homocidal ideation Skin= Negative for Rash    Examination: Constitutional: Not in acute distress, 15 L high flow nasal cannula.  Appears chronically ill. Respiratory: Bilateral diffuse rhonchi Cardiovascular: Normal sinus rhythm, no rubs Abdomen: Nontender nondistended good bowel sounds Musculoskeletal: No edema noted Skin: No rashes seen Neurologic: CN 2-12 grossly intact.  And nonfocal Psychiatric: Normal judgment and insight. Alert and oriented x 3. Normal mood.      Objective: Vitals:   08/26/19 2000 08/26/19 2005 08/26/19 2333 08/27/19 0404  BP: 131/77  114/76 132/81  Pulse: 67  63 65  Resp: (!) 34 19  15  Temp: 97.8 F (36.6 C)  97.7 F (36.5 C) 97.7 F (36.5 C)  TempSrc: Axillary  Axillary Axillary  SpO2: 96% 96% 95% 95%  Weight:      Height:       No intake or output data in the 24 hours ending 08/27/19 0805 Filed Weights   08/21/19 1232 08/21/19 2217  Weight: 68 kg 71.9 kg  Data Reviewed:   CBC: Recent Labs  Lab 08/22/19 0219 08/22/19 0219 08/23/19 0130 08/24/19 0352 08/25/19 0155 08/26/19 0155 08/27/19 0007  WBC 5.2   < > 5.1 5.7 6.1 8.6 8.7  NEUTROABS 4.3  --  3.9 4.6 4.8  6.6  --   HGB 11.0*   < > 11.1* 10.7* 11.0* 11.4* 11.7*  HCT 36.3   < > 34.8* 33.9* 34.4* 37.1 37.3  MCV 76.1*   < > 74.0* 73.7* 73.2* 74.2* 73.7*  PLT 248   < > 282 333 343 368 390   < > = values in this interval not displayed.   Basic Metabolic Panel: Recent Labs  Lab 08/22/19 0219 08/22/19 0219 08/23/19 0130 08/24/19 0352 08/25/19 0155 08/26/19 0155 08/27/19 0007  NA 141   < > 143 143 142 142 143  K 4.3   < > 3.7 3.3* 3.8 3.5 3.8  CL 104   < > 105 102 102 101 103  CO2 30   < > _0 GLUCOSE 118*   < > 87 89 99 110* 104*  BUN 10   < > _1 CREATININE 0.53   < > 0.48 0.61 0.41* 0.52 0.58  CALCIUM 8.5*   < > 8.8* 8.6* 8.8* 8.8* 9.2  MG 1.6*   < > 2.0 1.8 2.1 1.8 2.0  PHOS 3.2  --  2.7 3.4 3.6 2.7  --    < > = values in this interval not displayed.   GFR: Estimated Creatinine Clearance: 71.2 mL/min (by C-G formula based on SCr of 0.58 mg/dL). Liver Function Tests: Recent Labs  Lab 08/22/19 0219 08/23/19 0130 08/24/19 0352 08/25/19 0155 08/26/19 0155  AST 41 43* 39 33 31  ALT _2 ALKPHOS 46 48 58 59 68  BILITOT 0.5 0.7 0.7 0.7 0.8  PROT 6.1* 6.2* 6.6 6.6 6.7  ALBUMIN 3.1* 3.2* 3.3* 3.3* 3.3*   No results for input(s): LIPASE, AMYLASE in the last 168 hours. No results for input(s): AMMONIA in the last 168 hours. Coagulation Profile: No results for input(s): INR, PROTIME in the last 168 hours. Cardiac Enzymes: No results for input(s): CKTOTAL, CKMB, CKMBINDEX, TROPONINI in the last 168 hours. BNP (last 3 results) Recent Labs    01/28/19 1602  PROBNP 74.0   HbA1C: No results for input(s): HGBA1C in the last 72 hours. CBG: No results for input(s): GLUCAP in the last 168 hours. Lipid Profile: No results for input(s): CHOL, HDL, LDLCALC, TRIG, CHOLHDL, LDLDIRECT in the last 72 hours. Thyroid Function Tests: No results for input(s): TSH, T4TOTAL, FREET4, T3FREE, THYROIDAB in the last 72 hours. Anemia Panel: Recent Labs     08/25/19 0155 08/26/19 0155  FERRITIN 355* 333*   Sepsis Labs: Recent Labs  Lab 08/21/19 1350 08/21/19 1811 08/26/19 0155  PROCALCITON 0.47  --  0.10  LATICACIDVEN 1.5 1.7  --     Recent Results (from the past 240 hour(s))  Blood Culture (routine x 2)     Status: None   Collection Time: 08/21/19  1:50 PM   Specimen: BLOOD  Result Value Ref Range Status   Specimen Description   Final    BLOOD RIGHT WRIST Performed at [facility] [address], [place], [place] [address]    Special Requests   Final    BOTTLES DRAWN AEROBIC AND ANAEROBIC Blood Culture adequate volume Performed at [facility]  [address], [place], [place] [address]    Culture   Final    NO GROWTH 5 DAYS Performed at [facility], [place] [address], [place], [place] [address]    Report Status 08/26/2019 FINAL  Final  Blood Culture (routine x 2)     Status: None   Collection Time: 08/21/19  1:53 PM   Specimen: BLOOD  Result Value Ref Range Status   Specimen Description   Final    BLOOD RIGHT ANTECUBITAL Performed at [facility] [address] [place], [place] [address]    Special Requests   Final    BOTTLES DRAWN AEROBIC AND ANAEROBIC Blood Culture adequate volume Performed at [facility] [address] [place], [place] [address]    Culture   Final    NO GROWTH 5 DAYS Performed at [facility], [place] [address], [place], [place] [address]    Report Status 08/26/2019 FINAL  Final         Radiology Studies: No results found.      Scheduled Meds: . vitamin C  500 mg Oral Daily  . dexamethasone (DECADRON) injection  6 mg Intravenous Q24H  . enoxaparin (LOVENOX) injection  40 mg Subcutaneous BID  . furosemide  40 mg Intravenous Once  . Ipratropium-Albuterol  1 puff Inhalation Q6H  . multivitamin with minerals  1 tablet Oral Daily  . pantoprazole  40 mg Oral Daily  . Pirfenidone  801 mg Oral TID    . potassium chloride  40 mEq Oral Once  . zinc sulfate  220 mg Oral Daily   Continuous Infusions:    LOS: 6 days   Time spent= 35 mins    Nejla Reasor Arsenio Loader, MD [facility]  If 7PM-7AM, please contact night-coverage  08/27/2019, 8:05 AM

## 2019-08-28 LAB — BASIC METABOLIC PANEL
Anion gap: 14 (ref 5–15)
BUN: 19 mg/dL (ref 8–23)
CO2: 30 mmol/L (ref 22–32)
Calcium: 9.4 mg/dL (ref 8.9–10.3)
Chloride: 98 mmol/L (ref 98–111)
Creatinine, Ser: 0.68 mg/dL (ref 0.44–1.00)
GFR calc Af Amer: >60 mL/min (ref >60)
GFR calc non Af Amer: >60 mL/min (ref >60)
Glucose, Bld: 89 mg/dL (ref 70–99)
Potassium: 3.9 mmol/L (ref 3.5–5.1)
Sodium: 142 mmol/L (ref 135–145)

## 2019-08-28 LAB — MAGNESIUM: Magnesium: 2.1 mg/dL (ref 1.7–2.4)

## 2019-08-28 LAB — CBC
HCT: 40.6 % (ref 36.0–46.0)
Hemoglobin: 12.6 g/dL (ref 12.0–15.0)
MCH: 22.7 pg — ABNORMAL LOW (ref 26.0–34.0)
MCHC: 31 g/dL (ref 30.0–36.0)
MCV: 73.2 fL — ABNORMAL LOW (ref 80.0–100.0)
Platelets: 467 10*3/uL — ABNORMAL HIGH (ref 150–400)
RBC: 5.55 MIL/uL — ABNORMAL HIGH (ref 3.87–5.11)
RDW: 18 % — ABNORMAL HIGH (ref 11.5–15.5)
WBC: 11 10*3/uL — ABNORMAL HIGH (ref 4.0–10.5)
nRBC: 0 % (ref 0.0–0.2)

## 2019-08-28 NOTE — Progress Notes (Signed)
PROGRESS NOTE    Zairah Arista  RCB:[id number] DOB: [birth year] DOA: 08/21/2019 PCP: Marrian Salvage, [name]   Brief Narrative:  64 year old with history of GERD, pulmonary fibrosis with chronic hypoxia on 4 L nasal cannula at home on Imuran and pirfenidone presented to the ED with shortness of breath on 2/11 found to have COVID-19 pneumonia.  CTA chest showed pulmonary fibrosis with reactive lymphadenopathy but no evidence of pulmonary embolism.  Completed course of remdesivir 2/17 and received convalescent plasma 2/15.  Not a candidate for Actemra as patient was on Imuran as outpatient.   Assessment & Plan:   Active Problems:   Pulmonary fibrosis (HCC)   ILD (interstitial lung disease) (HCC)   Gastroesophageal reflux disease   Pneumonia due to COVID-19 virus   Acute respiratory failure with hypoxia (HCC)  Acute on chronic hypoxic respiratory failure secondary to COVID-19 pneumonia, persistent Pulmonary interstitial fibrosis -Oxygen levels-Still on 10+L HF [place], on 4L [place] -Remdesivir-completed 2/17 -Decadrone-day 7 -Actemra- Not a candidate.  Was on Imuran at home. -Convalescent plasma- 2U 2/15 -Antibiotics- None -procalcitonin-negative.  BNP 45. -Elevated D-dimer, Lovenox BID.  -CTA 2/14= Neg for PE; Pulm Fibrosis with Reactive LN.  -Supportive care-antitussive, inhalers, I-S/flutter -Vitamin C & Zinc. Prone >16hrs/day.  -Routine: Labs have been reviewed including ferritin, LDH, CRP, d-dimer, fibrinogen.  Will need to trend this lab daily. -Holding Imuran. Cont Pirfenidone TID  Acute RLE DVT Lovenox to 4m/Kg q12hrs.   GERD -PPI  DVT prophylaxis: Lovenox BID  Code Status: Limited code-DNI Family Communication:  Spoke with CDominica  Disposition Plan:   Patient From= Home  Patient Anticipated D/C place= Home  Barriers= persistently significantly hypoxic.  She is quite ill at this time.  Unsafe for discharge until this is resolved.  Radiology Studies: CTA 2/14= Neg for  PE; Pulm Fibrosis with Reactive LN.  LE Dopplers 2/19 = RLE + DVT  Subjective: Still remains short of breath with exertion with constant coughing after use of incentive spirometer and flutter valve.  Bleeding noted after increasing her Lovenox for lower extremity DVT treatment.  Review of Systems Otherwise negative except as per HPI, including: General = no fevers, chills, dizziness,  fatigue HEENT/EYES = negative for loss of vision, double vision, blurred vision,  sore throa Cardiovascular= negative for chest pain, palpitation Respiratory/lungs= negative for hemoptysis,  Gastrointestinal= negative for nausea, vomiting, abdominal pain Genitourinary= negative for Dysuria MSK = Negative for arthralgia, myalgias Neurology= Negative for headache, numbness, tingling  Psychiatry= Negative for suicidal and homocidal ideation Skin= Negative for Rash   Examination: Constitutional: Not in acute distress, chronically ill-appearing.  13 L high flow nasal cannula Respiratory: Bilateral diffuse rhonchi. Cardiovascular: Normal sinus rhythm, no rubs Abdomen: Nontender nondistended good bowel sounds Musculoskeletal: No edema noted Skin: No rashes seen Neurologic: CN 2-12 grossly intact.  And nonfocal Psychiatric: Normal judgment and insight. Alert and oriented x 3. Normal mood.   Objective: Vitals:   08/27/19 1955 08/27/19 2343 08/28/19 0409 08/28/19 0807  BP: 115/79 131/77 111/85 117/74  Pulse: 97 74 71   Resp:   19   Temp: 98.7 F (37.1 C) (!) 97.1 F (36.2 C) 98.3 F (36.8 C) 98 F (36.7 C)  TempSrc: Oral Oral Oral Oral  SpO2:  95% 94%   Weight:      Height:        Intake/Output Summary (Last 24 hours) at 08/28/2019 1121 Last data filed at 08/28/2019 1000 Gross per 24 hour  Intake 400 ml  Output 800 ml  Net -400 ml   Filed Weights   08/21/19 1232 08/21/19 2217  Weight: 68 kg 71.9 kg     Data Reviewed:   CBC: Recent Labs  Lab 08/22/19 0219 08/22/19 0219 08/23/19 0130  08/23/19 0130 08/24/19 0352 08/25/19 0155 08/26/19 0155 08/27/19 0007 08/28/19 0620  WBC 5.2   < > 5.1   < > 5.7 6.1 8.6 8.7 11.0*  NEUTROABS 4.3  --  3.9  --  4.6 4.8 6.6  --   --   HGB 11.0*   < > 11.1*   < > 10.7* 11.0* 11.4* 11.7* 12.6  HCT 36.3   < > 34.8*   < > 33.9* 34.4* 37.1 37.3 40.6  MCV 76.1*   < > 74.0*   < > 73.7* 73.2* 74.2* 73.7* 73.2*  PLT 248   < > 282   < > 333 343 368 390 467*   < > = values in this interval not displayed.   Basic Metabolic Panel: Recent Labs  Lab 08/22/19 0219 08/22/19 0219 08/23/19 0130 08/23/19 0130 08/24/19 0352 08/25/19 0155 08/26/19 0155 08/27/19 0007 08/28/19 0620  NA 141   < > 143   < > 143 142 142 143 142  K 4.3   < > 3.7   < > 3.3* 3.8 3.5 3.8 3.9  CL 104   < > 105   < > 102 102 101 103 98  CO2 30   < > 28   < > _0 GLUCOSE 118*   < > 87   < > 89 99 110* 104* 89  BUN 10   < > 11   < > _1 CREATININE 0.53   < > 0.48   < > 0.61 0.41* 0.52 0.58 0.68  CALCIUM 8.5*   < > 8.8*   < > 8.6* 8.8* 8.8* 9.2 9.4  MG 1.6*   < > 2.0   < > 1.8 2.1 1.8 2.0 2.1  PHOS 3.2  --  2.7  --  3.4 3.6 2.7  --   --    < > = values in this interval not displayed.   GFR: Estimated Creatinine Clearance: 71.2 mL/min (by C-G formula based on SCr of 0.68 mg/dL). Liver Function Tests: Recent Labs  Lab 08/22/19 0219 08/23/19 0130 08/24/19 0352 08/25/19 0155 08/26/19 0155  AST 41 43* 39 33 31  ALT _2 ALKPHOS 46 48 58 59 68  BILITOT 0.5 0.7 0.7 0.7 0.8  PROT 6.1* 6.2* 6.6 6.6 6.7  ALBUMIN 3.1* 3.2* 3.3* 3.3* 3.3*   No results for input(s): LIPASE, AMYLASE in the last 168 hours. No results for input(s): AMMONIA in the last 168 hours. Coagulation Profile: No results for input(s): INR, PROTIME in the last 168 hours. Cardiac Enzymes: No results for input(s): CKTOTAL, CKMB, CKMBINDEX, TROPONINI in the last 168 hours. BNP (last 3 results) Recent Labs    01/28/19 1602  PROBNP 74.0   HbA1C: No results for  input(s): HGBA1C in the last 72 hours. CBG: No results for input(s): GLUCAP in the last 168 hours. Lipid Profile: No results for input(s): CHOL, HDL, LDLCALC, TRIG, CHOLHDL, LDLDIRECT in the last 72 hours. Thyroid Function Tests: No results for input(s): TSH, T4TOTAL, FREET4, T3FREE, THYROIDAB in the last 72 hours. Anemia Panel: Recent Labs    08/26/19 0155  FERRITIN 333*   Sepsis Labs: Recent Labs  Lab 08/21/19 1350 08/21/19 1811  08/26/19 0155  PROCALCITON 0.47  --  0.10  LATICACIDVEN 1.5 1.7  --     Recent Results (from the past 240 hour(s))  Blood Culture (routine x 2)     Status: None   Collection Time: 08/21/19  1:50 PM   Specimen: BLOOD  Result Value Ref Range Status   Specimen Description   Final    BLOOD RIGHT WRIST Performed at [facility] [address], [place], [place] [address]    Special Requests   Final    BOTTLES DRAWN AEROBIC AND ANAEROBIC Blood Culture adequate volume Performed at [facility] [address], [place], [place] [address]    Culture   Final    NO GROWTH 5 DAYS Performed at [facility], [place] [address] [place], [place] [address]    Report Status 08/26/2019 FINAL  Final  Blood Culture (routine x 2)     Status: None   Collection Time: 08/21/19  1:53 PM   Specimen: BLOOD  Result Value Ref Range Status   Specimen Description   Final    BLOOD RIGHT ANTECUBITAL Performed at [facility] [address] [place], [place] [address]    Special Requests   Final    BOTTLES DRAWN AEROBIC AND ANAEROBIC Blood Culture adequate volume Performed at [facility] [address] [place], [place] [address]    Culture   Final    NO GROWTH 5 DAYS Performed at [facility], [place] [address] [place], [place] [address]    Report Status 08/26/2019 FINAL  Final         Radiology Studies: VAS [place] LOWER EXTREMITY VENOUS (DVT)  Result Date: 08/27/2019  Lower  Venous DVTStudy Indications: Swelling.  Risk Factors: COVID 19 positive. Comparison Study: No prior studies. Performing Technologist: Oliver Hum RVT  Examination Guidelines: A complete evaluation includes B-mode imaging, spectral Doppler, color Doppler, and power Doppler as needed of all accessible portions of each vessel. Bilateral testing is considered an integral part of a complete examination. Limited examinations for reoccurring indications may be performed as noted. The reflux portion of the exam is performed with the patient in reverse Trendelenburg.  +---------+---------------+---------+-----------+----------+--------------+ RIGHT    CompressibilityPhasicitySpontaneityPropertiesThrombus Aging +---------+---------------+---------+-----------+----------+--------------+ CFV      Full           Yes      Yes                                 +---------+---------------+---------+-----------+----------+--------------+ SFJ      Full                                                        +---------+---------------+---------+-----------+----------+--------------+ FV Prox  Full                                                        +---------+---------------+---------+-----------+----------+--------------+ FV Mid   Full                                                        +---------+---------------+---------+-----------+----------+--------------+  FV DistalFull                                                        +---------+---------------+---------+-----------+----------+--------------+ PFV      Full                                                        +---------+---------------+---------+-----------+----------+--------------+ POP      None           No       No                   Acute          +---------+---------------+---------+-----------+----------+--------------+ PTV      Full                                                         +---------+---------------+---------+-----------+----------+--------------+ PERO     Partial                                      Acute          +---------+---------------+---------+-----------+----------+--------------+ Gastroc  None                                         Acute          +---------+---------------+---------+-----------+----------+--------------+   +---------+---------------+---------+-----------+----------+--------------+ LEFT     CompressibilityPhasicitySpontaneityPropertiesThrombus Aging +---------+---------------+---------+-----------+----------+--------------+ CFV      Full           Yes      Yes                                 +---------+---------------+---------+-----------+----------+--------------+ SFJ      Full                                                        +---------+---------------+---------+-----------+----------+--------------+ FV Prox  Full                                                        +---------+---------------+---------+-----------+----------+--------------+ FV Mid   Full                                                        +---------+---------------+---------+-----------+----------+--------------+ FV  DistalFull                                                        +---------+---------------+---------+-----------+----------+--------------+ PFV      Full                                                        +---------+---------------+---------+-----------+----------+--------------+ POP      Full           Yes      Yes                                 +---------+---------------+---------+-----------+----------+--------------+ PTV      Full                                                        +---------+---------------+---------+-----------+----------+--------------+ PERO     Full                                                         +---------+---------------+---------+-----------+----------+--------------+     Summary: RIGHT: - Findings consistent with acute deep vein thrombosis involving the right popliteal vein, right peroneal veins, and right gastrocnemius veins. - No cystic structure found in the popliteal fossa.  LEFT: - There is no evidence of deep vein thrombosis in the lower extremity.  - No cystic structure found in the popliteal fossa.  *See table(s) above for measurements and observations. Electronically signed by Deitra Mayo MD on 08/27/2019 at 1:57:20 PM.    Final         Scheduled Meds: . vitamin C  500 mg Oral Daily  . budesonide  1 puff Inhalation BID  . dexamethasone (DECADRON) injection  6 mg Intravenous Q24H  . enoxaparin (LOVENOX) injection  1 mg/kg Subcutaneous Q12H  . Ipratropium-Albuterol  1 puff Inhalation Q6H  . multivitamin with minerals  1 tablet Oral Daily  . pantoprazole  40 mg Oral Daily  . Pirfenidone  801 mg Oral TID  . zinc sulfate  220 mg Oral Daily   Continuous Infusions:    LOS: 7 days   Time spent= 35 mins    Daquon Greenleaf Arsenio Loader, MD [facility]  If 7PM-7AM, please contact night-coverage  08/28/2019, 11:21 AM

## 2019-08-29 ENCOUNTER — Inpatient Hospital Stay (HOSPITAL_COMMUNITY): Payer: Medicare HMO

## 2019-08-29 LAB — CBC
HCT: 38.8 % (ref 36.0–46.0)
Hemoglobin: 12.1 g/dL (ref 12.0–15.0)
MCH: 23.2 pg — ABNORMAL LOW (ref 26.0–34.0)
MCHC: 31.2 g/dL (ref 30.0–36.0)
MCV: 74.3 fL — ABNORMAL LOW (ref 80.0–100.0)
Platelets: 492 10*3/uL — ABNORMAL HIGH (ref 150–400)
RBC: 5.22 MIL/uL — ABNORMAL HIGH (ref 3.87–5.11)
RDW: 18 % — ABNORMAL HIGH (ref 11.5–15.5)
WBC: 10.6 10*3/uL — ABNORMAL HIGH (ref 4.0–10.5)
nRBC: 0 % (ref 0.0–0.2)

## 2019-08-29 LAB — BASIC METABOLIC PANEL
Anion gap: 14 (ref 5–15)
BUN: 19 mg/dL (ref 8–23)
CO2: 33 mmol/L — ABNORMAL HIGH (ref 22–32)
Calcium: 9.5 mg/dL (ref 8.9–10.3)
Chloride: 97 mmol/L — ABNORMAL LOW (ref 98–111)
Creatinine, Ser: 0.64 mg/dL (ref 0.44–1.00)
GFR calc Af Amer: >60 mL/min (ref >60)
GFR calc non Af Amer: >60 mL/min (ref >60)
Glucose, Bld: 87 mg/dL (ref 70–99)
Potassium: 3.8 mmol/L (ref 3.5–5.1)
Sodium: 144 mmol/L (ref 135–145)

## 2019-08-29 LAB — MAGNESIUM: Magnesium: 2.3 mg/dL (ref 1.7–2.4)

## 2019-08-29 MED ORDER — FUROSEMIDE 10 MG/ML IJ SOLN
40.0000 mg | Freq: Once | INTRAMUSCULAR | Status: AC
  Administered 2019-08-29: 40 mg via INTRAVENOUS
  Filled 2019-08-29: qty 4

## 2019-08-29 NOTE — Progress Notes (Signed)
1954 pt was increasingly short of breath and tachypnic, O2 sats dropped into the 70s, HFNC increased from 6-8L and was not effective.  Placed pt on NRB mask at 15L. Pt O2 sats increased to 100%. Charge nurse and rapid response nurse notified and Dr. Vanita Ingles paged. Orders given for chest x-ray and IV lasix. Rapid response nurse increased HFNC to 10 and attempted to remove the NRB mask,  Pt O2 sats quickly dropped into the 70's again. NRB back in place with sats at 100%.

## 2019-08-29 NOTE — Progress Notes (Signed)
PROGRESS NOTE    Stacy [name]  YIR:[id number] DOB: [birth year] DOA: 08/21/2019 PCP: Marrian Salvage, [name]   Brief Narrative:  64 year old with history of GERD, pulmonary fibrosis with chronic hypoxia on 4 L nasal cannula at home on Imuran and pirfenidone presented to the ED with shortness of breath on 2/11 found to have COVID-19 pneumonia.  CTA chest showed pulmonary fibrosis with reactive lymphadenopathy but no evidence of pulmonary embolism.  Completed course of remdesivir 2/17 and received convalescent plasma 2/15.  Not a candidate for Actemra as patient was on Imuran as outpatient.   Assessment & Plan:   Active Problems:   Pulmonary fibrosis (HCC)   ILD (interstitial lung disease) (HCC)   Gastroesophageal reflux disease   Pneumonia due to COVID-19 virus   Acute respiratory failure with hypoxia (HCC)  Acute on chronic hypoxic respiratory failure secondary to COVID-19 pneumonia, persistent Pulmonary interstitial fibrosis -Oxygen levels-still remains on 10+ liters high flow nasal cannula.  On 4 L oxygen at home. -Remdesivir-completed 2/17 -Decadrone-day 8 -Actemra- Not a candidate.  Was on Imuran at home. -Convalescent plasma- 2U 2/15 -Antibiotics- None -procalcitonin-negative.  BNP 45. -Elevated D-dimer, Lovenox BID.  -CTA 2/14= Neg for PE; Pulm Fibrosis with Reactive LN.  -Supportive care-antitussive, inhalers, I-S/flutter -Vitamin C & Zinc. Prone >16hrs/day.  -Routine: Labs have been reviewed including ferritin, LDH, CRP, d-dimer, fibrinogen.  Will need to trend this lab daily. -Holding Imuran. Cont Pirfenidone TID  Acute RLE DVT Full dose Lovenox  GERD -PPI  DVT prophylaxis: Therapeutic dose of Lovenox Code Status: Limited code-DNI Family Communication: Daughter did not answer therefore left a voicemail Disposition Plan:   Patient From= Home  Patient Anticipated D/C place= Home  Barriers= persistently significantly hypoxic.  She is quite ill at this time.   Unsafe for discharge until this is resolved.  Radiology Studies: CTA 2/14= Neg for PE; Pulm Fibrosis with Reactive LN.  LE Dopplers 2/19 = RLE + DVT  Subjective: Still hypoxic needing high O2. Symptomatically tells me she feels slightly less short of breath and feels like her lungs are more open. Appetite is better today.   Review of Systems Otherwise negative except as per HPI, including: General = no fevers, chills, dizziness,  fatigue HEENT/EYES = negative for loss of vision, double vision, blurred vision,  sore throa Cardiovascular= negative for chest pain, palpitation Respiratory/lungs= negative for  hemoptysis,  Gastrointestinal= negative for nausea, vomiting, abdominal pain Genitourinary= negative for Dysuria MSK = Negative for arthralgia, myalgias Neurology= Negative for headache, numbness, tingling  Psychiatry= Negative for suicidal and homocidal ideation Skin= Negative for Rash   Examination: Constitutional: Not in acute distress, 2 L high flow nasal cannula Respiratory: Bilateral rhonchi Cardiovascular: Normal sinus rhythm, no rubs Abdomen: Nontender nondistended good bowel sounds Musculoskeletal: No edema noted Skin: No rashes seen Neurologic: CN 2-12 grossly intact.  And nonfocal Psychiatric: Normal judgment and insight. Alert and oriented x 3. Normal mood.     Objective: Vitals:   08/28/19 2340 08/29/19 0338 08/29/19 0600 08/29/19 0743  BP: 114/77 (!) 145/89  130/66  Pulse: 62 70 (!) 59 93  Resp: (!) 22 (!) 28 14 (!) 24  Temp: 98.3 F (36.8 C) 98.6 F (37 C)  97.9 F (36.6 C)  TempSrc: Oral Oral  Oral  SpO2: 99% 100% 100% 96%  Weight:      Height:        Intake/Output Summary (Last 24 hours) at 08/29/2019 0802 Last data filed at 08/28/2019 1800 Gross per 24 hour  Intake 1000 ml  Output --  Net 1000 ml   Filed Weights   08/21/19 1232 08/21/19 2217  Weight: 68 kg 71.9 kg     Data Reviewed:   CBC: Recent Labs  Lab 08/23/19 0130  08/23/19 0130 08/24/19 0352 08/24/19 0352 08/25/19 0155 08/26/19 0155 08/27/19 0007 08/28/19 0620 08/29/19 0140  WBC 5.1   < > 5.7   < > 6.1 8.6 8.7 11.0* 10.6*  NEUTROABS 3.9  --  4.6  --  4.8 6.6  --   --   --   HGB 11.1*   < > 10.7*   < > 11.0* 11.4* 11.7* 12.6 12.1  HCT 34.8*   < > 33.9*   < > 34.4* 37.1 37.3 40.6 38.8  MCV 74.0*   < > 73.7*   < > 73.2* 74.2* 73.7* 73.2* 74.3*  PLT 282   < > 333   < > 343 368 390 467* 492*   < > = values in this interval not displayed.   Basic Metabolic Panel: Recent Labs  Lab 08/23/19 0130 08/23/19 0130 08/24/19 0352 08/24/19 0352 08/25/19 0155 08/26/19 0155 08/27/19 0007 08/28/19 0620 08/29/19 0140  NA 143   < > 143   < > 142 142 143 142 144  K 3.7   < > 3.3*   < > 3.8 3.5 3.8 3.9 3.8  CL 105   < > 102   < > 102 101 103 98 97*  CO2 28   < > 29   < > _0 33*  GLUCOSE 87   < > 89   < > 99 110* 104* 89 87  BUN 11   < > 12   < > _1 CREATININE 0.48   < > 0.61   < > 0.41* 0.52 0.58 0.68 0.64  CALCIUM 8.8*   < > 8.6*   < > 8.8* 8.8* 9.2 9.4 9.5  MG 2.0   < > 1.8   < > 2.1 1.8 2.0 2.1 2.3  PHOS 2.7  --  3.4  --  3.6 2.7  --   --   --    < > = values in this interval not displayed.   GFR: Estimated Creatinine Clearance: 71.2 mL/min (by C-G formula based on SCr of 0.64 mg/dL). Liver Function Tests: Recent Labs  Lab 08/23/19 0130 08/24/19 0352 08/25/19 0155 08/26/19 0155  AST 43* 39 33 31  ALT _2 ALKPHOS 48 58 59 68  BILITOT 0.7 0.7 0.7 0.8  PROT 6.2* 6.6 6.6 6.7  ALBUMIN 3.2* 3.3* 3.3* 3.3*   No results for input(s): LIPASE, AMYLASE in the last 168 hours. No results for input(s): AMMONIA in the last 168 hours. Coagulation Profile: No results for input(s): INR, PROTIME in the last 168 hours. Cardiac Enzymes: No results for input(s): CKTOTAL, CKMB, CKMBINDEX, TROPONINI in the last 168 hours. BNP (last 3 results) Recent Labs    01/28/19 1602  PROBNP 74.0   HbA1C: No results for input(s):  HGBA1C in the last 72 hours. CBG: No results for input(s): GLUCAP in the last 168 hours. Lipid Profile: No results for input(s): CHOL, HDL, LDLCALC, TRIG, CHOLHDL, LDLDIRECT in the last 72 hours. Thyroid Function Tests: No results for input(s): TSH, T4TOTAL, FREET4, T3FREE, THYROIDAB in the last 72 hours. Anemia Panel: No results for input(s): VITAMINB12, FOLATE, FERRITIN, TIBC, IRON, RETICCTPCT in the last 72 hours. Sepsis Labs: Recent Labs  Lab 08/26/19 0155  PROCALCITON 0.10    Recent Results (from the past 240 hour(s))  Blood Culture (routine x 2)     Status: None   Collection Time: 08/21/19  1:50 PM   Specimen: BLOOD  Result Value Ref Range Status   Specimen Description   Final    BLOOD RIGHT WRIST Performed at [facility] [address] [place], [place] [address]    Special Requests   Final    BOTTLES DRAWN AEROBIC AND ANAEROBIC Blood Culture adequate volume Performed at [facility] [address] [place], [place] [address]    Culture   Final    NO GROWTH 5 DAYS Performed at [facility], [place] [address], [place], [place] [address]    Report Status 08/26/2019 FINAL  Final  Blood Culture (routine x 2)     Status: None   Collection Time: 08/21/19  1:53 PM   Specimen: BLOOD  Result Value Ref Range Status   Specimen Description   Final    BLOOD RIGHT ANTECUBITAL Performed at [facility] [address] [place], [place] [address]    Special Requests   Final    BOTTLES DRAWN AEROBIC AND ANAEROBIC Blood Culture adequate volume Performed at [facility] [address], [place], [place] [address]    Culture   Final    NO GROWTH 5 DAYS Performed at [facility], [place] [address] [place], [place] [address]    Report Status 08/26/2019 FINAL  Final         Radiology Studies: VAS [place] LOWER EXTREMITY VENOUS (DVT)  Result Date: 08/27/2019  Lower Venous DVTStudy Indications:  Swelling.  Risk Factors: COVID 19 positive. Comparison Study: No prior studies. Performing Technologist: Oliver Hum RVT  Examination Guidelines: A complete evaluation includes B-mode imaging, spectral Doppler, color Doppler, and power Doppler as needed of all accessible portions of each vessel. Bilateral testing is considered an integral part of a complete examination. Limited examinations for reoccurring indications may be performed as noted. The reflux portion of the exam is performed with the patient in reverse Trendelenburg.  +---------+---------------+---------+-----------+----------+--------------+ RIGHT    CompressibilityPhasicitySpontaneityPropertiesThrombus Aging +---------+---------------+---------+-----------+----------+--------------+ CFV      Full           Yes      Yes                                 +---------+---------------+---------+-----------+----------+--------------+ SFJ      Full                                                        +---------+---------------+---------+-----------+----------+--------------+ FV Prox  Full                                                        +---------+---------------+---------+-----------+----------+--------------+ FV Mid   Full                                                        +---------+---------------+---------+-----------+----------+--------------+  FV DistalFull                                                        +---------+---------------+---------+-----------+----------+--------------+ PFV      Full                                                        +---------+---------------+---------+-----------+----------+--------------+ POP      None           No       No                   Acute          +---------+---------------+---------+-----------+----------+--------------+ PTV      Full                                                         +---------+---------------+---------+-----------+----------+--------------+ PERO     Partial                                      Acute          +---------+---------------+---------+-----------+----------+--------------+ Gastroc  None                                         Acute          +---------+---------------+---------+-----------+----------+--------------+   +---------+---------------+---------+-----------+----------+--------------+ LEFT     CompressibilityPhasicitySpontaneityPropertiesThrombus Aging +---------+---------------+---------+-----------+----------+--------------+ CFV      Full           Yes      Yes                                 +---------+---------------+---------+-----------+----------+--------------+ SFJ      Full                                                        +---------+---------------+---------+-----------+----------+--------------+ FV Prox  Full                                                        +---------+---------------+---------+-----------+----------+--------------+ FV Mid   Full                                                        +---------+---------------+---------+-----------+----------+--------------+ FV  DistalFull                                                        +---------+---------------+---------+-----------+----------+--------------+ PFV      Full                                                        +---------+---------------+---------+-----------+----------+--------------+ POP      Full           Yes      Yes                                 +---------+---------------+---------+-----------+----------+--------------+ PTV      Full                                                        +---------+---------------+---------+-----------+----------+--------------+ PERO     Full                                                         +---------+---------------+---------+-----------+----------+--------------+     Summary: RIGHT: - Findings consistent with acute deep vein thrombosis involving the right popliteal vein, right peroneal veins, and right gastrocnemius veins. - No cystic structure found in the popliteal fossa.  LEFT: - There is no evidence of deep vein thrombosis in the lower extremity.  - No cystic structure found in the popliteal fossa.  *See table(s) above for measurements and observations. Electronically signed by Deitra Mayo MD on 08/27/2019 at 1:57:20 PM.    Final         Scheduled Meds: . vitamin C  500 mg Oral Daily  . budesonide  1 puff Inhalation BID  . dexamethasone (DECADRON) injection  6 mg Intravenous Q24H  . enoxaparin (LOVENOX) injection  1 mg/kg Subcutaneous Q12H  . Ipratropium-Albuterol  1 puff Inhalation Q6H  . multivitamin with minerals  1 tablet Oral Daily  . pantoprazole  40 mg Oral Daily  . Pirfenidone  801 mg Oral TID  . zinc sulfate  220 mg Oral Daily   Continuous Infusions:    LOS: 8 days   Time spent= 35 mins    Slevin Gunby Arsenio Loader, MD [facility]  If 7PM-7AM, please contact night-coverage  08/29/2019, 8:02 AM

## 2019-08-30 LAB — BASIC METABOLIC PANEL
Anion gap: 12 (ref 5–15)
BUN: 17 mg/dL (ref 8–23)
CO2: 32 mmol/L (ref 22–32)
Calcium: 9.4 mg/dL (ref 8.9–10.3)
Chloride: 96 mmol/L — ABNORMAL LOW (ref 98–111)
Creatinine, Ser: 0.58 mg/dL (ref 0.44–1.00)
GFR calc Af Amer: >60 mL/min (ref >60)
GFR calc non Af Amer: >60 mL/min (ref >60)
Glucose, Bld: 110 mg/dL — ABNORMAL HIGH (ref 70–99)
Potassium: 3.5 mmol/L (ref 3.5–5.1)
Sodium: 140 mmol/L (ref 135–145)

## 2019-08-30 LAB — MAGNESIUM: Magnesium: 2.1 mg/dL (ref 1.7–2.4)

## 2019-08-30 LAB — CBC
HCT: 40.5 % (ref 36.0–46.0)
Hemoglobin: 12.9 g/dL (ref 12.0–15.0)
MCH: 23.5 pg — ABNORMAL LOW (ref 26.0–34.0)
MCHC: 31.9 g/dL (ref 30.0–36.0)
MCV: 73.6 fL — ABNORMAL LOW (ref 80.0–100.0)
Platelets: 497 10*3/uL — ABNORMAL HIGH (ref 150–400)
RBC: 5.5 MIL/uL — ABNORMAL HIGH (ref 3.87–5.11)
RDW: 18.2 % — ABNORMAL HIGH (ref 11.5–15.5)
WBC: 12.1 10*3/uL — ABNORMAL HIGH (ref 4.0–10.5)
nRBC: 0 % (ref 0.0–0.2)

## 2019-08-30 NOTE — Plan of Care (Signed)
  Problem: Education: Goal: Knowledge of risk factors and measures for prevention of condition will improve Outcome: Progressing   Problem: Coping: Goal: Psychosocial and spiritual needs will be supported Outcome: Progressing   Problem: Respiratory: Goal: Will maintain a patent airway Outcome: Progressing Goal: Complications related to the disease process, condition or treatment will be avoided or minimized Outcome: Progressing   Problem: Clinical Measurements: Goal: Will remain free from infection Outcome: Progressing   Problem: Activity: Goal: Risk for activity intolerance will decrease Outcome: Progressing   Problem: Safety: Goal: Ability to remain free from injury will improve Outcome: Progressing   Problem: Skin Integrity: Goal: Risk for impaired skin integrity will decrease Outcome: Progressing

## 2019-08-30 NOTE — Telephone Encounter (Signed)
Patient is currently admitted at [facility].  Patient appointment 08/31/19, with Dr. Vaughan Browner cancelled.

## 2019-08-30 NOTE — Progress Notes (Signed)
PROGRESS NOTE    Stacy [name]  GEZ:[id number] DOB: 1955/10/14 DOA: 08/21/2019 PCP: Marrian Salvage, [name]   Brief Narrative:  64 year old with history of GERD, pulmonary fibrosis with chronic hypoxia on 4 L nasal cannula at home on Imuran and pirfenidone presented to the ED with shortness of breath on 2/11 found to have COVID-19 pneumonia.  CTA chest showed pulmonary fibrosis with reactive lymphadenopathy but no evidence of pulmonary embolism.  Completed course of remdesivir 2/17 and received convalescent plasma 2/15.  Not a candidate for Actemra as patient was on Imuran as outpatient.   Assessment & Plan:   Active Problems:   Pulmonary fibrosis (HCC)   ILD (interstitial lung disease) (HCC)   Gastroesophageal reflux disease   Pneumonia due to COVID-19 virus   Acute respiratory failure with hypoxia (HCC)  Acute on chronic hypoxic respiratory failure secondary to COVID-19 pneumonia, persistently hypoxic Pulmonary interstitial fibrosis -Oxygen levels-continues to require 10+ liters high flow nasal cannula.  On home 4 L nasal cannula. -Remdesivir-completed 2/17 -Decadrone-day 9 -Actemra- Not a candidate.  Was on Imuran at home. -Convalescent plasma- 2U 2/15 -Antibiotics- None -procalcitonin-negative.  BNP 45. -Elevated D-dimer, Lovenox BID.  -CTA 2/14= Neg for PE; Pulm Fibrosis with Reactive LN.  -Supportive care-antitussive, inhalers, I-S/flutter -Vitamin C & Zinc. Prone >16hrs/day.  -Routine: Labs have been reviewed including ferritin, LDH, CRP, d-dimer, fibrinogen.  Will need to trend this lab daily. -Holding Imuran. Cont Pirfenidone TID  Acute RLE DVT Full dose Lovenox  GERD -PPI  Patient is extremely slow to improve at this point.  DVT prophylaxis: Therapeutic dose of Lovenox Code Status: Limited code-DNI Family Communication: Carla updated.  Disposition Plan:   Patient From= Home  Patient Anticipated D/C place= Home  Barriers= persistently significantly  hypoxic.  She is quite ill at this time.  Unsafe for discharge until this is resolved.  Radiology Studies: CTA 2/14= Neg for PE; Pulm Fibrosis with Reactive LN.  LE Dopplers 2/19 = RLE + DVT  Subjective: Tells me she feels better.  Only intermittently now requiring nonrebreather but still on 15 L high flow.  Tachycardia appears to have improved as well.  With use of incentive spirometer her oxygen saturation momentarily does drop down to as low as 78% on 15 L high flow with recovery back to 95%.  Review of Systems Otherwise negative except as per HPI, including: General = no fevers, chills, dizziness,  fatigue HEENT/EYES = negative for loss of vision, double vision, blurred vision,  sore throa Cardiovascular= negative for chest pain, palpitation Respiratory/lungs= negative for  hemoptysis,  Gastrointestinal= negative for nausea, vomiting, abdominal pain Genitourinary= negative for Dysuria MSK = Negative for arthralgia, myalgias Neurology= Negative for headache, numbness, tingling  Psychiatry= Negative for suicidal and homocidal ideation Skin= Negative for Rash   Examination: Constitutional: Not in acute distress, 15 L high flow Respiratory: Some bilateral rhonchi Cardiovascular: Normal sinus rhythm, no rubs Abdomen: Nontender nondistended good bowel sounds Musculoskeletal: No edema noted Skin: No rashes seen Neurologic: CN 2-12 grossly intact.  And nonfocal Psychiatric: Normal judgment and insight. Alert and oriented x 3. Normal mood.     Objective: Vitals:   08/30/19 0400 08/30/19 0500 08/30/19 0600 08/30/19 0736  BP: 114/73   127/86  Pulse: 97 67 63 73  Resp: (!) 23 (!) 38 (!) 21 (!) 23  Temp: 98.2 F (36.8 C)   97.8 F (36.6 C)  TempSrc: Oral   Axillary  SpO2: 99% 100% 100% 100%  Weight:  Height:        Intake/Output Summary (Last 24 hours) at 08/30/2019 0814 Last data filed at 08/30/2019 0500 Gross per 24 hour  Intake 480 ml  Output 900 ml  Net -420 ml    Filed Weights   08/21/19 1232 08/21/19 2217  Weight: 68 kg 71.9 kg     Data Reviewed:   CBC: Recent Labs  Lab 08/24/19 0352 08/24/19 0352 08/25/19 0155 08/25/19 0155 08/26/19 0155 08/27/19 0007 08/28/19 0620 08/29/19 0140 08/30/19 0110  WBC 5.7   < > 6.1   < > 8.6 8.7 11.0* 10.6* 12.1*  NEUTROABS 4.6  --  4.8  --  6.6  --   --   --   --   HGB 10.7*   < > 11.0*   < > 11.4* 11.7* 12.6 12.1 12.9  HCT 33.9*   < > 34.4*   < > 37.1 37.3 40.6 38.8 40.5  MCV 73.7*   < > 73.2*   < > 74.2* 73.7* 73.2* 74.3* 73.6*  PLT 333   < > 343   < > 368 390 467* 492* 497*   < > = values in this interval not displayed.   Basic Metabolic Panel: Recent Labs  Lab 08/24/19 0352 08/24/19 0352 08/25/19 0155 08/25/19 0155 08/26/19 0155 08/27/19 0007 08/28/19 0620 08/29/19 0140 08/30/19 0110  NA 143   < > 142   < > 142 143 142 144 140  K 3.3*   < > 3.8   < > 3.5 3.8 3.9 3.8 3.5  CL 102   < > 102   < > 101 103 98 97* 96*  CO2 29   < > 29   < > _0 33* 32  GLUCOSE 89   < > 99   < > 110* 104* 89 87 110*  BUN 12   < > 17   < > _1 CREATININE 0.61   < > 0.41*   < > 0.52 0.58 0.68 0.64 0.58  CALCIUM 8.6*   < > 8.8*   < > 8.8* 9.2 9.4 9.5 9.4  MG 1.8   < > 2.1   < > 1.8 2.0 2.1 2.3 2.1  PHOS 3.4  --  3.6  --  2.7  --   --   --   --    < > = values in this interval not displayed.   GFR: Estimated Creatinine Clearance: 71.2 mL/min (by C-G formula based on SCr of 0.58 mg/dL). Liver Function Tests: Recent Labs  Lab 08/24/19 0352 08/25/19 0155 08/26/19 0155  AST 39 33 31  ALT _2 ALKPHOS 58 59 68  BILITOT 0.7 0.7 0.8  PROT 6.6 6.6 6.7  ALBUMIN 3.3* 3.3* 3.3*   No results for input(s): LIPASE, AMYLASE in the last 168 hours. No results for input(s): AMMONIA in the last 168 hours. Coagulation Profile: No results for input(s): INR, PROTIME in the last 168 hours. Cardiac Enzymes: No results for input(s): CKTOTAL, CKMB, CKMBINDEX, TROPONINI in the last 168  hours. BNP (last 3 results) Recent Labs    01/28/19 1602  PROBNP 74.0   HbA1C: No results for input(s): HGBA1C in the last 72 hours. CBG: No results for input(s): GLUCAP in the last 168 hours. Lipid Profile: No results for input(s): CHOL, HDL, LDLCALC, TRIG, CHOLHDL, LDLDIRECT in the last 72 hours. Thyroid Function Tests: No results for input(s): TSH, T4TOTAL, FREET4, T3FREE, THYROIDAB in the  last 72 hours. Anemia Panel: No results for input(s): VITAMINB12, FOLATE, FERRITIN, TIBC, IRON, RETICCTPCT in the last 72 hours. Sepsis Labs: Recent Labs  Lab 08/26/19 0155  PROCALCITON 0.10    Recent Results (from the past 240 hour(s))  Blood Culture (routine x 2)     Status: None   Collection Time: 08/21/19  1:50 PM   Specimen: BLOOD  Result Value Ref Range Status   Specimen Description   Final    BLOOD RIGHT WRIST Performed at [facility] [address] [place], [place] [address]    Special Requests   Final    BOTTLES DRAWN AEROBIC AND ANAEROBIC Blood Culture adequate volume Performed at [facility] [address], [place], [place] [address]    Culture   Final    NO GROWTH 5 DAYS Performed at [facility], [place] [address] [place], [place] [address]    Report Status 08/26/2019 FINAL  Final  Blood Culture (routine x 2)     Status: None   Collection Time: 08/21/19  1:53 PM   Specimen: BLOOD  Result Value Ref Range Status   Specimen Description   Final    BLOOD RIGHT ANTECUBITAL Performed at [facility] [address], [place], [place] [address]    Special Requests   Final    BOTTLES DRAWN AEROBIC AND ANAEROBIC Blood Culture adequate volume Performed at [facility] [address], [place], [place] [address]    Culture   Final    NO GROWTH 5 DAYS Performed at [facility], [place] [address] [place], [place] [address]    Report Status 08/26/2019 FINAL  Final          Radiology Studies: DG Chest 1 View  Result Date: 08/29/2019 CLINICAL DATA:  Pulmonary fibrosis EXAM: CHEST  1 VIEW COMPARISON:  08/21/2019, 08/22/2019 FINDINGS: Single frontal view of the chest again demonstrates basilar predominant pulmonary fibrosis. No new airspace disease, effusion, or pneumothorax. The cardiac silhouette is unremarkable. IMPRESSION: 1. Continued bibasilar pulmonary fibrosis. No acute airspace disease. Electronically Signed   By: Randa Ngo M.D.   On: 08/29/2019 21:10        Scheduled Meds: . vitamin C  500 mg Oral Daily  . budesonide  1 puff Inhalation BID  . dexamethasone (DECADRON) injection  6 mg Intravenous Q24H  . enoxaparin (LOVENOX) injection  1 mg/kg Subcutaneous Q12H  . Ipratropium-Albuterol  1 puff Inhalation Q6H  . multivitamin with minerals  1 tablet Oral Daily  . pantoprazole  40 mg Oral Daily  . Pirfenidone  801 mg Oral TID  . zinc sulfate  220 mg Oral Daily   Continuous Infusions:    LOS: 9 days   Time spent= 35 mins    Berneda Piccininni Arsenio Loader, MD [facility]  If 7PM-7AM, please contact night-coverage  08/30/2019, 8:14 AM

## 2019-08-30 NOTE — Progress Notes (Signed)
Pt hygiene products picked up from family member via security and given to patient at bedside.

## 2019-08-31 ENCOUNTER — Ambulatory Visit: Payer: Medicare HMO | Admitting: Pulmonary Disease

## 2019-08-31 DIAGNOSIS — J939 Pneumothorax, unspecified: Secondary | ICD-10-CM | POA: Diagnosis present

## 2019-08-31 NOTE — Evaluation (Signed)
Physical Therapy Evaluation Patient Details Name: Stacy [name] MRN: [id number] DOB: May 22, 1956 Today's Date: 08/31/2019   History of Present Illness  64 year old with history of GERD, pulmonary fibrosis with chronic hypoxia on 4 L nasal cannula at home on Imuran and pirfenidone presented to the ED with shortness of breath on 2/11 found to have COVID-19 pneumonia.  CTA chest showed pulmonary fibrosis with reactive lymphadenopathy but no evidence of pulmonary embolism.  Completed course of remdesivir 2/17 and received convalescent plasma 2/15.  Not a candidate for Actemra as patient was on Imuran as outpatient.  Clinical Impression   Pt admitted with above diagnosis. PTA was living home alone and reports that she was independent, she owns a walker but did not need to use it prior to illness. Pt currently with functional limitations due to the deficits listed below (see PT Problem List). This am pt is highly apprehensive and anxious about all mobility, she was able to get to edge of bed with stand by assist and cues but desat to min 84% with this on 10l/min via HFNC. Sitting edge of bed able to work on pursed lip breathing and in crease 02 sat to low 90s. Able to ambulate in room approx 68f with min-mod a and cues. Once completed ambulation pt desat to min of 78% able to sit and pursed lip breathe and recover to 88% within approx 3 mins. Sitting in recliner worked on breathing exercises including use of flutter valve and incentive spirometer, after this pt suddenly began breathing erratically and desat to 54%, with cues use of flutter valve,and increase to 15L/min via HFNC pt was unable to recover saturations, was to change probe from earlobe to finger when NRB was applied and sats began to increase immediately. With NRB in place pt able to increase saturation to 99-100%, noted HR had increased to 140s also during this, pt appeared to be calm and breathing stabilized, pt taken off NRB and kept on 15L vi  HFNC, after some time (approx 566ms) pt deast to 70s again on 15L HFNC with HR in 120s. NRB replaced and again 02 sat increased to 100 and HR dropped to <100. Pt left sitting in recliner with NRB on at 15L (HFNC was removed and pt able to maintain sats in 90s with just NRB at 15L). Pt seems to become anxious when she hears monitors beeping and desats, she has great difficulty with pursed lip breathing and taking deep breaths when she is in this state of panic. Pt will benefit from skilled PT to increase their independence and safety with mobility to allow discharge to the venue listed below.       Follow Up Recommendations Home health PT    Equipment Recommendations  Other (comment)(TBD)    Recommendations for Other Services OT consult     Precautions / Restrictions Precautions Precautions: Fall;Other (comment) Precaution Comments: desat w/ min activity, highly anxious  Restrictions Weight Bearing Restrictions: No      Mobility  Bed Mobility Overal bed mobility: Needs Assistance Bed Mobility: Supine to Sit     Supine to sit: Supervision     General bed mobility comments: needs set up and line management assist  Transfers Overall transfer level: Needs assistance Equipment used: None Transfers: Sit to/from Stand;Stand Pivot Transfers Sit to Stand: Min guard Stand pivot transfers: Min guard          Ambulation/Gait Ambulation/Gait assistance: Min assist;Mod assist Gait Distance (Feet): 36 Feet Assistive device: None Gait Pattern/deviations: Step-through pattern;Wide base  of support;Ataxic;Shuffle Gait velocity: decreased   General Gait Details: ambulated approx 59f in room with no AD and min/mod a, pt on 15L/min via HFNC min desat 71% with ambulation  Stairs            Wheelchair Mobility    Modified Rankin (Stroke Patients Only)       Balance Overall balance assessment: Needs assistance Sitting-balance support: Feet supported Sitting balance-Leahy  Scale: Good Sitting balance - Comments: sat edge of bed unsupported with good trunk control   Standing balance support: During functional activity Standing balance-Leahy Scale: Fair Standing balance comment: LObs noted needed min/mod a to correct                             Pertinent Vitals/Pain Pain Assessment: No/denies pain    Home Living Family/patient expects to be discharged to:: Private residence Living Arrangements: Alone Available Help at Discharge: Family Type of Home: Apartment Home Access: Level entry     Home Layout: One level Home Equipment: WEnvironmental consultant- 2 wheels;Tub bench      Prior Function Level of Independence: Independent               Hand Dominance   Dominant Hand: Right    Extremity/Trunk Assessment   Upper Extremity Assessment Upper Extremity Assessment: Generalized weakness    Lower Extremity Assessment Lower Extremity Assessment: Generalized weakness    Cervical / Trunk Assessment Cervical / Trunk Assessment: Normal  Communication   Communication: No difficulties  Cognition Arousal/Alertness: Awake/alert Behavior During Therapy: Agitated;Anxious Overall Cognitive Status: Within Functional Limits for tasks assessed                                        General Comments      Exercises Other Exercises Other Exercises: flutter valve to increase 02 sats Other Exercises: incentive spirometer x 5 pulls 5039m  Assessment/Plan    PT Assessment Patient needs continued PT services  PT Problem List Decreased strength;Decreased activity tolerance;Decreased balance;Decreased mobility;Decreased coordination;Decreased cognition;Decreased knowledge of use of DME;Decreased safety awareness       PT Treatment Interventions DME instruction;Gait training;Functional mobility training;Therapeutic activities;Therapeutic exercise;Balance training;Neuromuscular re-education;Patient/family education    PT Goals (Current  goals can be found in the Care Plan section)  Acute Rehab PT Goals Patient Stated Goal: to get better and go home PT Goal Formulation: With patient Time For Goal Achievement: 09/14/19 Potential to Achieve Goals: Fair    Frequency Min 4X/week   Barriers to discharge Other (comment);Decreased caregiver support lives at home alone, when asked about family to help out had long pause prior to shakily agreeing family can help out    Co-evaluation               AM-PAC PT "6 Clicks" Mobility  Outcome Measure Help needed turning from your back to your side while in a flat bed without using bedrails?: None Help needed moving from lying on your back to sitting on the side of a flat bed without using bedrails?: None Help needed moving to and from a bed to a chair (including a wheelchair)?: A Little Help needed standing up from a chair using your arms (e.g., wheelchair or bedside chair)?: A Little Help needed to walk in hospital room?: A Lot Help needed climbing 3-5 steps with a railing? : A Lot 6 Click  Score: 18    End of Session Equipment Utilized During Treatment: Oxygen Activity Tolerance: Patient limited by fatigue;Patient limited by lethargy;Treatment limited secondary to agitation;Treatment limited secondary to medical complications (Comment) Patient left: in chair;with call bell/phone within reach;with nursing/sitter in room Nurse Communication: Mobility status PT Visit Diagnosis: Unsteadiness on feet (R26.81);Other abnormalities of gait and mobility (R26.89);Muscle weakness (generalized) (M62.81)    Time: 7209-4709 PT Time Calculation (min) (ACUTE ONLY): 53 min   Charges:   PT Evaluation $PT Eval Moderate Complexity: 1 Mod PT Treatments $Gait Training: 8-22 mins $Therapeutic Exercise: 8-22 mins $Self Care/Home Management: 8-22        Horald Chestnut, PT   Delford Field 08/31/2019, 1:03 PM

## 2019-08-31 NOTE — Progress Notes (Signed)
Spiritual care rounding on pt due to length of stay.  Introduced Clinical biochemist as Theatre manager and provided emotional support at bedside.    Stacy [name] spoke with chaplain about exhaustion from chronic illness - stating "I'm tired of feeling this way" and "sometimes I wish I would just die"   She connected this with her faith - describing herself as one who "believes in the resurrection...  I believe we will just go to sleep for a while and wake with new body."  Chaplain provided empathic presence around exhaustion and space for Stacy [name] to process her experience with hospitalization.  She notes that she is close to one daughter, with whom she can be honest, but worries because this daughter "is going to take it the hardest when I go."     Stacy [name] is open to continued support from spiritual care.  Chaplain will continue to round with goal of supporting mood and providing space for Stacy [name] to process her hospitalization.

## 2019-08-31 NOTE — Progress Notes (Addendum)
PROGRESS NOTE  Stacy [name]  EGB:[id number] DOB: 06-02-1956 DOA: 08/21/2019 PCP: Stacy Salvage, [name]   Brief Narrative: Stacy [name] is a 64 y.o. female with a history of 4L O2-dependent pulmonary fibrosis who presented to the ED 2/13 with shortness of breath in the setting of covid-19 infection diagnosed 2/11. Remdesivir and steroids were started,2 units convalescent plasma given 2/15, and CTA chest showed no evidence of PE. D-dimer rose 4.5 > 19.48 on 2/17 and LE U/S confirmed acute DVTs in the right popliteal, peroneal, and gastrocnemius veins for which therapeutic lovenox was started.  Assessment & Plan: Principal Problem:   Acute respiratory failure with hypoxia (HCC) Active Problems:   Pulmonary fibrosis (HCC)   ILD (interstitial lung disease) (HCC)   Gastroesophageal reflux disease   Pneumonia due to COVID-19 virus   Pneumothorax, right  Acute on chronic hypoxemic respiratory failure due to covid-19 pneumonia on pulmonary fibrosis, pulmonary HTN: SARS-CoV-2 Ag positive on 2/11. Continues on 12L HFNC. With severity of preexisting pulmonary disease, a protracted recovery is expected.  - Completed remdesivir x5 days.  - Encourage OOB, IS, FV, and awake proning. Will order PT and OT as patient is at very high risk of deconditioning. - Continue airborne, contact precautions for 21 days from positive testing. - CXR 2/21 with bibasilar fibrosis, no airspace disease noted on my personal review. Today will be final day of steroids.  - Will need to reschedule pulmonology appointment 2/23 with Dr. Vaughan Browner. Continue chronic pirfenidone (pt supply) and have been holding imuran during hospitalization. - Note BNP wnl and PCT undetectable a few days ago. WBC creeping up (possibly due to steroids) as is platelet count (suspected reactive thrombocytosis). If fevers, will have low threshold to begin antibiotics in this immunocompromised patient.  Acute RLE DVT in peroneal, gastrocnemius and  popliteal veins:  - Continue therapeutic anticoagulation. No significant bleeding noted.   GERD:  - Continue PPI  DVT prophylaxis: Lovenox 63m/kg q12h Code Status: Partial, DNI Family Communication: Daughter by phone Disposition Plan: Patient from home. Will get PT/OT to rebuild strength and recommend appropriate disposition once hypoxemia has improved.   Consultants:   None  Procedures:  CTA 2/14= Neg for PE; Pulm Fibrosis with Reactive LN.  LE Dopplers 2/19 = RLE + DVT  Antimicrobials:  Remdesivir   Subjective: No significant change. She's got pain "all over" that is severe, chronic, constant, improved with tramadol, a home medication. Also shortness of breath limiting mobility. Has not been getting up with PT/OT. Denies hematuria, blood in stool/melena. Having some pink tinged sputum with cough occasionally.   Objective: Vitals:   08/30/19 2324 08/31/19 0000 08/31/19 0400 08/31/19 0714  BP: 127/87 120/81 122/85 127/78  Pulse:  64 62 82  Resp:  20 17 (!) 22  Temp: 97.9 F (36.6 C) 98 F (36.7 C) 98 F (36.7 C) 98.4 F (36.9 C)  TempSrc: Oral Oral Oral Axillary  SpO2:  100% 100% 93%  Weight:      Height:        Intake/Output Summary (Last 24 hours) at 08/31/2019 0912 Last data filed at 08/30/2019 1800 Gross per 24 hour  Intake 960 ml  Output 400 ml  Net 560 ml   Filed Weights   08/21/19 1232 08/21/19 2217  Weight: 68 kg 71.9 kg    Gen: 64y.o. female in no distress, tired-appearing Pulm: Non-labored but tachypneic breathing by HFNC, crackles throughout, no wheezes. CV: Regular rate and rhythm. No murmur, rub, or gallop. No JVD, no  pedal edema. GI: Abdomen soft, non-tender, non-distended, with normoactive bowel sounds. No organomegaly or masses felt. Ext: Warm, no deformities Skin: No rashes, lesions or ulcers Neuro: Alert and oriented. No focal neurological deficits. Psych: Judgement and insight appear normal. Mood & affect appropriate.   Data Reviewed:  I have personally reviewed following labs and imaging studies  CBC: Recent Labs  Lab 08/25/19 0155 08/25/19 0155 08/26/19 0155 08/27/19 0007 08/28/19 0620 08/29/19 0140 08/30/19 0110  WBC 6.1   < > 8.6 8.7 11.0* 10.6* 12.1*  NEUTROABS 4.8  --  6.6  --   --   --   --   HGB 11.0*   < > 11.4* 11.7* 12.6 12.1 12.9  HCT 34.4*   < > 37.1 37.3 40.6 38.8 40.5  MCV 73.2*   < > 74.2* 73.7* 73.2* 74.3* 73.6*  PLT 343   < > 368 390 467* 492* 497*   < > = values in this interval not displayed.   Basic Metabolic Panel: Recent Labs  Lab 08/25/19 0155 08/25/19 0155 08/26/19 0155 08/27/19 0007 08/28/19 0620 08/29/19 0140 08/30/19 0110  NA 142   < > 142 143 142 144 140  K 3.8   < > 3.5 3.8 3.9 3.8 3.5  CL 102   < > 101 103 98 97* 96*  CO2 29   < > _0 33* 32  GLUCOSE 99   < > 110* 104* 89 87 110*  BUN 17   < > _1 CREATININE 0.41*   < > 0.52 0.58 0.68 0.64 0.58  CALCIUM 8.8*   < > 8.8* 9.2 9.4 9.5 9.4  MG 2.1   < > 1.8 2.0 2.1 2.3 2.1  PHOS 3.6  --  2.7  --   --   --   --    < > = values in this interval not displayed.   GFR: Estimated Creatinine Clearance: 71.2 mL/min (by C-G formula based on SCr of 0.58 mg/dL). Liver Function Tests: Recent Labs  Lab 08/25/19 0155 08/26/19 0155  AST 33 31  ALT 19 20  ALKPHOS 59 68  BILITOT 0.7 0.8  PROT 6.6 6.7  ALBUMIN 3.3* 3.3*   No results for input(s): LIPASE, AMYLASE in the last 168 hours. No results for input(s): AMMONIA in the last 168 hours. Coagulation Profile: No results for input(s): INR, PROTIME in the last 168 hours. Cardiac Enzymes: No results for input(s): CKTOTAL, CKMB, CKMBINDEX, TROPONINI in the last 168 hours. BNP (last 3 results) Recent Labs    01/28/19 1602  PROBNP 74.0   HbA1C: No results for input(s): HGBA1C in the last 72 hours. CBG: No results for input(s): GLUCAP in the last 168 hours. Lipid Profile: No results for input(s): CHOL, HDL, LDLCALC, TRIG, CHOLHDL, LDLDIRECT in the last 72  hours. Thyroid Function Tests: No results for input(s): TSH, T4TOTAL, FREET4, T3FREE, THYROIDAB in the last 72 hours. Anemia Panel: No results for input(s): VITAMINB12, FOLATE, FERRITIN, TIBC, IRON, RETICCTPCT in the last 72 hours. Urine analysis:    Component Value Date/Time   COLORURINE YELLOW 07/21/2019 0333   APPEARANCEUR HAZY (A) 07/21/2019 0333   LABSPEC 1.013 07/21/2019 0333   PHURINE 6.0 07/21/2019 [place] 07/21/2019 0333   HGBUR NEGATIVE 07/21/2019 0333   BILIRUBINUR NEGATIVE 07/21/2019 0333   KETONESUR 5 (A) 07/21/2019 0333   PROTEINUR NEGATIVE 07/21/2019 0333   NITRITE NEGATIVE 07/21/2019 0333   LEUKOCYTESUR TRACE (A) 07/21/2019 5583  Recent Results (from the past 240 hour(s))  Blood Culture (routine x 2)     Status: None   Collection Time: 08/21/19  1:50 PM   Specimen: BLOOD  Result Value Ref Range Status   Specimen Description   Final    BLOOD RIGHT WRIST Performed at [facility] [address], [place], [place] [address]    Special Requests   Final    BOTTLES DRAWN AEROBIC AND ANAEROBIC Blood Culture adequate volume Performed at [facility] [address] [place], [place] [address]    Culture   Final    NO GROWTH 5 DAYS Performed at [facility], [place] [address], [place], [place] [address]    Report Status 08/26/2019 FINAL  Final  Blood Culture (routine x 2)     Status: None   Collection Time: 08/21/19  1:53 PM   Specimen: BLOOD  Result Value Ref Range Status   Specimen Description   Final    BLOOD RIGHT ANTECUBITAL Performed at [facility] [address] [place], [place] [address]    Special Requests   Final    BOTTLES DRAWN AEROBIC AND ANAEROBIC Blood Culture adequate volume Performed at [facility] [address], [place], [place] [address]    Culture   Final    NO GROWTH 5 DAYS Performed at [facility], [place] [address]  [place], [place] [address]    Report Status 08/26/2019 FINAL  Final      Radiology Studies: DG Chest 1 View  Result Date: 08/29/2019 CLINICAL DATA:  Pulmonary fibrosis EXAM: CHEST  1 VIEW COMPARISON:  08/21/2019, 08/22/2019 FINDINGS: Single frontal view of the chest again demonstrates basilar predominant pulmonary fibrosis. No new airspace disease, effusion, or pneumothorax. The cardiac silhouette is unremarkable. IMPRESSION: 1. Continued bibasilar pulmonary fibrosis. No acute airspace disease. Electronically Signed   By: Randa Ngo M.D.   On: 08/29/2019 21:10    Scheduled Meds: . vitamin C  500 mg Oral Daily  . budesonide  1 puff Inhalation BID  . dexamethasone (DECADRON) injection  6 mg Intravenous Q24H  . enoxaparin (LOVENOX) injection  1 mg/kg Subcutaneous Q12H  . Ipratropium-Albuterol  1 puff Inhalation Q6H  . multivitamin with minerals  1 tablet Oral Daily  . pantoprazole  40 mg Oral Daily  . Pirfenidone  801 mg Oral TID  . zinc sulfate  220 mg Oral Daily   Continuous Infusions:   LOS: 10 days   Time spent: 25 minutes.  Patrecia Pour, MD [facility] [email or web address] 08/31/2019, 9:12 AM

## 2019-08-31 NOTE — Progress Notes (Signed)
Pt working with PT

## 2019-08-31 NOTE — Progress Notes (Signed)
Occupational Therapy Evaluation Patient Details Name: Stacy [name] MRN: [id number] DOB: 1956/03/12 Today's Date: 08/31/2019    History of Present Illness 64 year old with history of GERD, pulmonary fibrosis with chronic hypoxia on 4 L nasal cannula at home on Imuran and pirfenidone presented to the ED with shortness of breath on 2/11 found to have COVID-19 pneumonia.  CTA chest showed pulmonary fibrosis with reactive lymphadenopathy but no evidence of pulmonary embolism.  Completed course of remdesivir 2/17 and received convalescent plasma 2/15.  Not a candidate for Actemra as patient was on Imuran as outpatient.   Clinical Impression   Patient lives in an apartment alone and was independent at prior level.  She has a walker but does not use.  Patient was supine in bed on arrival and nursing requesting to stay bed level as patient was on 15L HFNC and NRB.  Had worked with PT earlier and desat into 60s with activity, tachy in 140s and RR in 67s per nurse.  Patient able to bridge in bed to complete LE dressing and bathing with mod assist. Bed mobility with min guard.  Desat to 9 with bridging. Patient would benefit to work on increasing activity tolerance and strength for ADLs. Will continue to follow with OT acutely to address the deficits listed below. Will further assess standing and mobility level with future visits.      Follow Up Recommendations  Home health OT    Equipment Recommendations       Recommendations for Other Services       Precautions / Restrictions Precautions Precautions: Fall;Other (comment) Precaution Comments: desat w/ min activity, highly anxious  Restrictions Weight Bearing Restrictions: No      Mobility Bed Mobility Overal bed mobility: Needs Assistance Bed Mobility: Rolling Rolling: Min guard            Transfers Overall transfer level: Needs assistance               General transfer comment: Stayed in bed for eval    Balance Overall  balance assessment: Needs assistance                                         ADL either performed or assessed with clinical judgement   ADL Overall ADL's : Needs assistance/impaired Eating/Feeding: Set up;Sitting   Grooming: Wash/dry hands;Wash/dry face;Sitting;Set up   Upper Body Bathing: Set up;Sitting   Lower Body Bathing: Moderate assistance;Bed level   Upper Body Dressing : Set up;Sitting   Lower Body Dressing: Moderate assistance;Bed level   Toilet Transfer: Moderate assistance             General ADL Comments: Stayed at bed level for this eval. Will further evaluate standing and mobility level in next session.     Vision         Perception     Praxis      Pertinent Vitals/Pain Pain Assessment: No/denies pain     Hand Dominance Right   Extremity/Trunk Assessment Upper Extremity Assessment Upper Extremity Assessment: Generalized weakness           Communication Communication Communication: No difficulties   Cognition Arousal/Alertness: Awake/alert Behavior During Therapy: WFL for tasks assessed/performed Overall Cognitive Status: Within Functional Limits for tasks assessed  General Comments       Exercises     Shoulder Instructions      Home Living Family/patient expects to be discharged to:: Private residence Living Arrangements: Alone Available Help at Discharge: Family Type of Home: Apartment Home Access: Level entry     [place]: One level     Bathroom Shower/Tub: Teacher, early years/pre: [place]: Environmental consultant - 2 wheels;Tub bench          Prior Functioning/Environment Level of Independence: Independent                 OT Problem List: Decreased strength;Decreased activity tolerance;Decreased safety awareness;Impaired balance (sitting and/or standing);Cardiopulmonary status limiting activity      OT  Treatment/Interventions: Self-care/ADL training;Therapeutic exercise;Energy conservation;Therapeutic activities    OT Goals(Current goals can be found in the care plan section) Acute Rehab OT Goals Patient Stated Goal: to get better and go home OT Goal Formulation: With patient Time For Goal Achievement: 09/14/19 Potential to Achieve Goals: Good  OT Frequency: Min 3X/week   Barriers to D/C:            Co-evaluation              AM-PAC OT "6 Clicks" Daily Activity     Outcome Measure Help from another person eating meals?: None Help from another person taking care of personal grooming?: A Little Help from another person toileting, which includes using toliet, bedpan, or urinal?: A Lot Help from another person bathing (including washing, rinsing, drying)?: A Lot Help from another person to put on and taking off regular upper body clothing?: A Little Help from another person to put on and taking off regular lower body clothing?: A Lot 6 Click Score: 16   End of Session Equipment Utilized During Treatment: Oxygen Nurse Communication: Mobility status  Activity Tolerance: Patient limited by fatigue Patient left: in bed;with call bell/phone within reach;with bed alarm set  OT Visit Diagnosis: Unsteadiness on feet (R26.81);Muscle weakness (generalized) (M62.81)                Time: 7619-5093 OT Time Calculation (min): 26 min Charges:  OT General Charges $OT Visit: 1 Visit OT Evaluation $OT Eval Moderate Complexity: 1 Mod OT Treatments $Self Care/Home Management : 8-22 mins  August Luz, OTR/L   Phylliss Bob 08/31/2019, 3:33 PM

## 2019-09-01 LAB — BASIC METABOLIC PANEL
Anion gap: 13 (ref 5–15)
BUN: 17 mg/dL (ref 8–23)
CO2: 32 mmol/L (ref 22–32)
Calcium: 9.2 mg/dL (ref 8.9–10.3)
Chloride: 96 mmol/L — ABNORMAL LOW (ref 98–111)
Creatinine, Ser: 0.6 mg/dL (ref 0.44–1.00)
GFR calc Af Amer: >60 mL/min (ref >60)
GFR calc non Af Amer: >60 mL/min (ref >60)
Glucose, Bld: 83 mg/dL (ref 70–99)
Potassium: 3.4 mmol/L — ABNORMAL LOW (ref 3.5–5.1)
Sodium: 141 mmol/L (ref 135–145)

## 2019-09-01 LAB — C-REACTIVE PROTEIN: CRP: 1.1 mg/dL — ABNORMAL HIGH (ref <1.0)

## 2019-09-01 LAB — PROCALCITONIN: Procalcitonin: <0.1 ng/mL

## 2019-09-01 MED ORDER — ACETAMINOPHEN 325 MG PO TABS
650.0000 mg | ORAL_TABLET | Freq: Four times a day (QID) | ORAL | Status: DC | PRN
  Administered 2019-09-01 – 2019-09-10 (×23): 650 mg via ORAL
  Filled 2019-09-01 (×23): qty 2

## 2019-09-01 MED ORDER — POTASSIUM CHLORIDE CRYS ER 20 MEQ PO TBCR
40.0000 meq | EXTENDED_RELEASE_TABLET | Freq: Once | ORAL | Status: AC
  Administered 2019-09-01: 40 meq via ORAL
  Filled 2019-09-01: qty 2

## 2019-09-01 MED ORDER — ONDANSETRON HCL 4 MG/2ML IJ SOLN
4.0000 mg | Freq: Once | INTRAMUSCULAR | Status: AC
  Administered 2019-09-01: 4 mg via INTRAVENOUS
  Filled 2019-09-01: qty 2

## 2019-09-01 MED ORDER — ONDANSETRON HCL 4 MG/2ML IJ SOLN
4.0000 mg | Freq: Four times a day (QID) | INTRAMUSCULAR | Status: DC | PRN
  Administered 2019-09-03: 4 mg via INTRAVENOUS
  Filled 2019-09-01 (×2): qty 2

## 2019-09-01 NOTE — Plan of Care (Signed)
  Problem: Education: Goal: Knowledge of risk factors and measures for prevention of condition will improve Outcome: Progressing   Problem: Coping: Goal: Psychosocial and spiritual needs will be supported Outcome: Progressing   Problem: Respiratory: Goal: Will maintain a patent airway Outcome: Progressing Goal: Complications related to the disease process, condition or treatment will be avoided or minimized Outcome: Progressing   Problem: Clinical Measurements: Goal: Will remain free from infection Outcome: Progressing   Problem: Activity: Goal: Risk for activity intolerance will decrease Outcome: Progressing   Problem: Safety: Goal: Ability to remain free from injury will improve Outcome: Progressing   Problem: Skin Integrity: Goal: Risk for impaired skin integrity will decrease Outcome: Progressing

## 2019-09-01 NOTE — Progress Notes (Signed)
PHYSICAL THERAPY PROGRESS REPORT:  CLINICAL IMPRESSION: pt is making progress with mobility, independence and also activity tolerance. Pt was able to tolerate more today than previous and also was able to complete pursed lip breathing more effectively to recover when desaturated from activity. Pt able to stand at sink approx 5 mins and brush teeth and wash face, on 6L/min via HFNC and did desat to low 80s but was able to remain calm and complete breathing to recover saturation to high 80s to be able to complete tasks. After seated rest break pt was also able to ambulate approx 92f with min/min guard assist in room. After ambulation pt desat to high 70s but was able to sit and pursed lip breathe and recover within 59ms. Pt still needs some cues to calm down and breathe but did much better today with managing anxiety and pursed lip breathing. Will continue to follow pt acutely and progress as she tolerates. At this time d/c recommendation may need to be amended but will wait till next session to see if continues to improve on same trajectory.    09/01/19 0942  PT Visit Information  Last PT Received On 09/01/19  Assistance Needed +1  History of Present Illness [age]ear old with history of GERD, pulmonary fibrosis with chronic hypoxia on 4 L nasal cannula at home on Imuran and pirfenidone presented to the ED with shortness of breath on 2/11 found to have COVID-19 pneumonia.  CTA chest showed pulmonary fibrosis with reactive lymphadenopathy but no evidence of pulmonary embolism.  Completed course of remdesivir 2/17 and received convalescent plasma 2/15.  Not a candidate for Actemra as patient was on Imuran as outpatient.  Subjective Data  Patient Stated Goal to get better and go home  Precautions  Precautions Fall;Other (comment)  Precaution Comments desat w/ min activity, highly anxious   Restrictions  Weight Bearing Restrictions No  Pain Assessment  Pain Assessment No/denies pain  Cognition   Arousal/Alertness Awake/alert  Behavior During Therapy WFL for tasks assessed/performed  Overall Cognitive Status No family/caregiver present to determine baseline cognitive functioning  Bed Mobility  General bed mobility comments pt sitting in recliner at therapist arrival'  Transfers  Overall transfer level Needs assistance  Equipment used None  Transfers Sit to/from Stand  Sit to [employer]  Ambulation/Gait  Ambulation/Gait assistance Min assist;Min guard  Gait Distance (Feet) 40 Feet  Assistive device None  Gait Pattern/deviations Step-through pattern  Gait velocity decreased  Balance  Overall balance assessment Needs assistance  Sitting-balance support Feet supported  Sitting balance-Leahy Scale Good  Standing balance support During functional activity  Standing balance-Leahy Scale Fair  PT - End of Session  Equipment Utilized During Treatment Oxygen  Activity Tolerance Patient limited by fatigue;Patient limited by lethargy;Treatment limited secondary to medical complications (Comment)  Patient left in chair;with call bell/phone within reach  Nurse Communication Mobility status   PT - Assessment/Plan  PT Plan Discharge plan needs to be updated  PT Visit Diagnosis Unsteadiness on feet (R26.81);Other abnormalities of gait and mobility (R26.89);Muscle weakness (generalized) (M62.81)  PT Frequency (ACUTE ONLY) Min 4X/week  Follow Up Recommendations Home health PT  PT equipment Other (comment)  AM-PAC PT "6 Clicks" Mobility Outcome Measure (Version 2)  Help needed turning from your back to your side while in a flat bed without using bedrails? 4  Help needed moving from lying on your back to sitting on the side of a flat bed without using bedrails? 4  Help needed moving to and from  a bed to a chair (including a wheelchair)? 3  Help needed standing up from a chair using your arms (e.g., wheelchair or bedside chair)? 3  Help needed to walk in hospital room? 2  Help needed  climbing 3-5 steps with a railing?  2  6 Click Score 18  Consider Recommendation of Discharge To: Home with [facility]  PT Goal Progression  Progress towards PT goals Progressing toward goals  Acute Rehab PT Goals  PT Goal Formulation With patient  Time For Goal Achievement 09/14/19  Potential to Achieve Goals Fair  PT Time Calculation  PT Start Time (ACUTE ONLY) 0909  PT Stop Time (ACUTE ONLY) 0942  PT Time Calculation (min) (ACUTE ONLY) 33 min  PT General Charges  $$ ACUTE PT VISIT 1 Visit  PT Treatments  $Gait Training 8-22 mins  $Therapeutic Activity 8-22 mins   Stacy [name], PT

## 2019-09-01 NOTE — Progress Notes (Signed)
Stacy [name]  OIZ:[id number] DOB: September 16, 1955 DOA: 08/21/2019 PCP: Marrian Salvage, [name]    Brief Narrative:  64 year old with a history of 4 L O2 dependent pulmonary fibrosis (on imuran and pirfenidone chronically) who presented to the ED 2/13 with shortness of breath and a known Covid infection diagnosed 2/11.  Since admission she has been treated with remdesivir, steroids, and 2 units of convalescent plasma.  She has been diagnosed with extensive acute right lower extremity DVTs during his hospital stay.  Significant Events: 2/11 Covid test positive 2/13 admit to [facility] via [facility] ED  COVID-19 specific Treatment: Remdesivir 2/13 > 2/17 Decadron 2/13 > 2/23 Convalescent plasma 2/15  Antimicrobials:  None  Subjective: Continues to require 6 L high flow nasal cannula oxygen support.  Is quite anxious.  Respirations appear comfortable when I first entered the room wound began talking about her treatment course she becomes more tachypneic and her oxygen saturations decline.  She tells me she still feels much more short of breath than her usual.  She denies chest pain or abdominal pain but does report some nausea.  Assessment & Plan:  COVID Pneumonia -acute on chronic hypoxic respiratory failure CTa negative for pulmonary embolism -has completed remdesivir and Decadron treatment courses -was dosed with convalescent plasma -slow to improve and still requires significantly more oxygen support than her baseline -continue to slowly wean -consider trial of diuretic  Recent Labs  Lab 08/26/19 0155 08/27/19 0007 09/01/19 0250  DDIMER 19.48* 11.07*  --   FERRITIN 333*  --   --   CRP 3.2*  --  1.1*  ALT 20  --   --   PROCALCITON 0.10  --  <0.10     Chronic hypoxic respiratory failure -pulmonary fibrosis Chronically on pirfenidone - Imuran on hold during hospital stay -will need to follow-up with her pulmonologist after discharge  Acute extensive right lower extremity  DVT Peroneal, gastrocnemius, and popliteal veins -on therapeutic anticoagulation  GERD  Mild hypokalemia Supplement to goal of 4.0 -magnesium has been normal  DVT prophylaxis: Therapeutic Lovenox Code Status: DO NOT INTUBATE Family Communication:  Disposition Plan: Anticipate eventual discharge home with [facility]  Consultants:  none  Objective: Blood pressure 127/86, pulse (!) 59, temperature 98.1 F (36.7 C), temperature source Oral, resp. rate 20, height 5' 6" (1.676 m), weight 71.9 kg, SpO2 100 %.  Intake/Output Summary (Last 24 hours) at 09/01/2019 0917 Last data filed at 09/01/2019 0400 Gross per 24 hour  Intake 800 ml  Output 600 ml  Net 200 ml   Filed Weights   08/21/19 1232 08/21/19 2217  Weight: 68 kg 71.9 kg    Examination: General: No acute respiratory distress Lungs: Fine diffuse crackles Cardiovascular: Regular rate and rhythm without murmur gallop or rub normal S1 and S2 Abdomen: Nontender, nondistended, soft, bowel sounds positive, no rebound, no ascites, no appreciable mass Extremities: No significant cyanosis, clubbing, or edema bilateral lower extremities  CBC: Recent Labs  Lab 08/26/19 0155 08/27/19 0007 08/28/19 0620 08/29/19 0140 08/30/19 0110  WBC 8.6   < > 11.0* 10.6* 12.1*  NEUTROABS 6.6  --   --   --   --   HGB 11.4*   < > 12.6 12.1 12.9  HCT 37.1   < > 40.6 38.8 40.5  MCV 74.2*   < > 73.2* 74.3* 73.6*  PLT 368   < > 467* 492* 497*   < > = values in this interval not displayed.   Basic Metabolic Panel:  Recent Labs  Lab 08/26/19 0155 08/27/19 0007 08/28/19 0620 08/28/19 0620 08/29/19 0140 08/30/19 0110 09/01/19 0250  NA 142   < > 142   < > 144 140 141  K 3.5   < > 3.9   < > 3.8 3.5 3.4*  CL 101   < > 98   < > 97* 96* 96*  CO2 29   < > 30   < > 33* 32 32  GLUCOSE 110*   < > 89   < > 87 110* 83  BUN 13   < > 19   < > _0 CREATININE 0.52   < > 0.68   < > 0.64 0.58 0.60  CALCIUM 8.8*   < > 9.4   < > 9.5 9.4 9.2  MG 1.8   < >  2.1  --  2.3 2.1  --   PHOS 2.7  --   --   --   --   --   --    < > = values in this interval not displayed.   GFR: Estimated Creatinine Clearance: 71.2 mL/min (by C-G formula based on SCr of 0.6 mg/dL).  Liver Function Tests: Recent Labs  Lab 08/26/19 0155  AST 31  ALT 20  ALKPHOS 68  BILITOT 0.8  PROT 6.7  ALBUMIN 3.3*    Scheduled Meds: . vitamin C  500 mg Oral Daily  . budesonide  1 puff Inhalation BID  . enoxaparin (LOVENOX) injection  1 mg/kg Subcutaneous Q12H  . Ipratropium-Albuterol  1 puff Inhalation Q6H  . multivitamin with minerals  1 tablet Oral Daily  . pantoprazole  40 mg Oral Daily  . Pirfenidone  801 mg Oral TID  . zinc sulfate  220 mg Oral Daily     LOS: 11 days   Cherene Altes, MD [facility] Office  [phone] Pager - Text Page per Amion  If 7PM-7AM, please contact night-coverage per Amion 09/01/2019, 9:17 AM

## 2019-09-01 NOTE — Progress Notes (Signed)
Patient is currently on 8L of HFNC and her oxygen saturations drop rapidly with exertion and becomes tachypneic. She has a productive cough and is coughing up small amounts of blood tinged mucus and is currently in bed resting.

## 2019-09-02 LAB — BASIC METABOLIC PANEL
Anion gap: 11 (ref 5–15)
BUN: 17 mg/dL (ref 8–23)
CO2: 33 mmol/L — ABNORMAL HIGH (ref 22–32)
Calcium: 9.1 mg/dL (ref 8.9–10.3)
Chloride: 98 mmol/L (ref 98–111)
Creatinine, Ser: 0.76 mg/dL (ref 0.44–1.00)
GFR calc Af Amer: >60 mL/min (ref >60)
GFR calc non Af Amer: >60 mL/min (ref >60)
Glucose, Bld: 88 mg/dL (ref 70–99)
Potassium: 4 mmol/L (ref 3.5–5.1)
Sodium: 142 mmol/L (ref 135–145)

## 2019-09-02 LAB — CBC
HCT: 42.1 % (ref 36.0–46.0)
Hemoglobin: 12.6 g/dL (ref 12.0–15.0)
MCH: 22.9 pg — ABNORMAL LOW (ref 26.0–34.0)
MCHC: 29.9 g/dL — ABNORMAL LOW (ref 30.0–36.0)
MCV: 76.4 fL — ABNORMAL LOW (ref 80.0–100.0)
Platelets: 433 10*3/uL — ABNORMAL HIGH (ref 150–400)
RBC: 5.51 MIL/uL — ABNORMAL HIGH (ref 3.87–5.11)
RDW: 18.6 % — ABNORMAL HIGH (ref 11.5–15.5)
WBC: 12.7 10*3/uL — ABNORMAL HIGH (ref 4.0–10.5)
nRBC: 0 % (ref 0.0–0.2)

## 2019-09-02 MED ORDER — APIXABAN 5 MG PO TABS
10.0000 mg | ORAL_TABLET | Freq: Two times a day (BID) | ORAL | Status: AC
  Administered 2019-09-02 – 2019-09-08 (×13): 10 mg via ORAL
  Filled 2019-09-02 (×4): qty 2
  Filled 2019-09-02: qty 4
  Filled 2019-09-02 (×9): qty 2

## 2019-09-02 MED ORDER — APIXABAN 5 MG PO TABS
5.0000 mg | ORAL_TABLET | Freq: Two times a day (BID) | ORAL | Status: DC
  Administered 2019-09-09 – 2019-09-10 (×3): 5 mg via ORAL
  Filled 2019-09-02 (×4): qty 1

## 2019-09-02 NOTE — Progress Notes (Signed)
Stacy [name]  RFF:[id number] DOB: 1955-08-11 DOA: 08/21/2019 PCP: Marrian Salvage, [name]    Brief Narrative:  64 year old with a history of 4 L O2 dependent pulmonary fibrosis (on imuran and pirfenidone chronically) who presented to the ED 2/13 with shortness of breath and a known Covid infection diagnosed 2/11.  Since admission she has been treated with remdesivir, steroids, and 2 units of convalescent plasma.  She has been diagnosed with extensive acute right lower extremity DVTs during his hospital stay.  Significant Events: 2/11 Covid test positive 2/13 admit to [facility] via [facility] ED  COVID-19 specific Treatment: Remdesivir 2/13 > 2/17 Decadron 2/13 > 2/23 Convalescent plasma 2/15  Antimicrobials:  None  Subjective: Desaturated into the low 80s on 6 L high flow nasal cannula yesterday while working with physical therapy.  During ambulation desaturated into the high 70s on support.  Anxiety exacerbating her respiratory issues.  While at rest saturations are in the mid 90s on 4 L conventional nasal cannula.  Appears much more comfortable at the time of my visit today.  Denies chest pain nausea vomiting abdominal pain.  States she feels less short of breath and is stronger.  Assessment & Plan:  COVID Pneumonia -acute on chronic hypoxic respiratory failure CTa negative for pulmonary embolism -has completed remdesivir and Decadron treatment courses -was dosed with convalescent plasma -slow to improve and still requires significantly more oxygen support than her baseline -continue to slowly wean   Recent Labs  Lab 08/27/19 0007 09/01/19 0250  DDIMER 11.07*  --   CRP  --  1.1*  PROCALCITON  --  <0.10     Chronic hypoxic respiratory failure -pulmonary fibrosis Chronically on pirfenidone - Imuran on hold during hospital stay -will need to follow-up with her Pulmonologist after discharge  Acute extensive right lower extremity DVT Peroneal, gastrocnemius, and popliteal  veins -on therapeutic lovenox - transition to Eliquis today   GERD  Mild hypokalemia Corrected to target of 4.0 with supplementation  DVT prophylaxis: Therapeutic Lovenox > Eliquis Code Status: DO NOT INTUBATE Family Communication:  Disposition Plan: The patient lives alone -she has improved dramatically over the last 24 hours -continue working with PT/OT -wean oxygen as able -consider possibility of discharge home but patient will need to improve significantly  Consultants:  none  Objective: Blood pressure 115/73, pulse (!) 105, temperature 98 F (36.7 C), temperature source Oral, resp. rate (!) 25, height 5' 6" (1.676 m), weight 71.9 kg, SpO2 (!) 82 %.  Intake/Output Summary (Last 24 hours) at 09/02/2019 0856 Last data filed at 09/02/2019 0500 Gross per 24 hour  Intake --  Output 400 ml  Net -400 ml   Filed Weights   08/21/19 1232 08/21/19 2217  Weight: 68 kg 71.9 kg    Examination: General: No acute respiratory distress Lungs: Fine diffuse crackles without wheezing Cardiovascular: RRR Abdomen: NT/ND, soft, BS positive, no rebound Extremities: No edema bilateral lower extremities  CBC: Recent Labs  Lab 08/29/19 0140 08/30/19 0110 09/02/19 0238  WBC 10.6* 12.1* 12.7*  HGB 12.1 12.9 12.6  HCT 38.8 40.5 42.1  MCV 74.3* 73.6* 76.4*  PLT 492* 497* 357*   Basic Metabolic Panel: Recent Labs  Lab 08/28/19 0620 08/28/19 0620 08/29/19 0140 08/29/19 0140 08/30/19 0110 09/01/19 0250 09/02/19 0238  NA 142   < > 144   < > 140 141 142  K 3.9   < > 3.8   < > 3.5 3.4* 4.0  CL 98   < > 97*   < >  96* 96* 98  CO2 30   < > 33*   < > 32 32 33*  GLUCOSE 89   < > 87   < > 110* 83 88  BUN 19   < > 19   < > _0 CREATININE 0.68   < > 0.64   < > 0.58 0.60 0.76  CALCIUM 9.4   < > 9.5   < > 9.4 9.2 9.1  MG 2.1  --  2.3  --  2.1  --   --    < > = values in this interval not displayed.   GFR: Estimated Creatinine Clearance: 71.2 mL/min (by C-G formula based on SCr of  0.76 mg/dL).  Liver Function Tests: No results for input(s): AST, ALT, ALKPHOS, BILITOT, PROT, ALBUMIN in the last 168 hours.  Scheduled Meds: . vitamin C  500 mg Oral Daily  . budesonide  1 puff Inhalation BID  . enoxaparin (LOVENOX) injection  1 mg/kg Subcutaneous Q12H  . Ipratropium-Albuterol  1 puff Inhalation Q6H  . multivitamin with minerals  1 tablet Oral Daily  . pantoprazole  40 mg Oral Daily  . Pirfenidone  801 mg Oral TID  . zinc sulfate  220 mg Oral Daily     LOS: 12 days   Cherene Altes, MD [facility] Office  [phone] Pager - Text Page per Amion  If 7PM-7AM, please contact night-coverage per Amion 09/02/2019, 8:56 AM

## 2019-09-02 NOTE — Plan of Care (Signed)
  Problem: Education: Goal: Knowledge of risk factors and measures for prevention of condition will improve 09/02/2019 2347 by Apolinar Junes, RN Outcome: Progressing 09/02/2019 2345 by Apolinar Junes, RN Outcome: Progressing   Problem: Coping: Goal: Psychosocial and spiritual needs will be supported 09/02/2019 2347 by Apolinar Junes, RN Outcome: Progressing 09/02/2019 2345 by Apolinar Junes, RN Outcome: Progressing   Problem: Respiratory: Goal: Will maintain a patent airway 09/02/2019 2347 by Apolinar Junes, RN Outcome: Progressing 09/02/2019 2345 by Apolinar Junes, RN Outcome: Progressing Goal: Complications related to the disease process, condition or treatment will be avoided or minimized 09/02/2019 2347 by Apolinar Junes, RN Outcome: Progressing 09/02/2019 2345 by Apolinar Junes, RN Outcome: Progressing   Problem: Clinical Measurements: Goal: Will remain free from infection 09/02/2019 2347 by Apolinar Junes, RN Outcome: Progressing 09/02/2019 2345 by Apolinar Junes, RN Outcome: Progressing   Problem: Activity: Goal: Risk for activity intolerance will decrease 09/02/2019 2347 by Apolinar Junes, RN Outcome: Progressing 09/02/2019 2345 by Apolinar Junes, RN Outcome: Progressing   Problem: Safety: Goal: Ability to remain free from injury will improve 09/02/2019 2347 by Apolinar Junes, RN Outcome: Progressing 09/02/2019 2345 by Apolinar Junes, RN Outcome: Progressing   Problem: Skin Integrity: Goal: Risk for impaired skin integrity will decrease 09/02/2019 2347 by Apolinar Junes, RN Outcome: Progressing 09/02/2019 2345 by Apolinar Junes, RN Outcome: Progressing

## 2019-09-02 NOTE — Progress Notes (Signed)
Physical Therapy Treatment Patient Details Name: Stacy [name] MRN: [id number] DOB: Jun 07, 1956 Today's Date: 09/02/2019    History of Present Illness 64 year old with history of GERD, pulmonary fibrosis with chronic hypoxia on 4 L nasal cannula at home on Imuran and pirfenidone presented to the ED with shortness of breath on 2/11 found to have COVID-19 pneumonia.  CTA chest showed pulmonary fibrosis with reactive lymphadenopathy but no evidence of pulmonary embolism.  Completed course of remdesivir 2/17 and received convalescent plasma 2/15.  Not a candidate for Actemra as patient was on Imuran as outpatient.    PT Comments    Pt in anxious mode today, desat to 80 with supine to sit, desat to 70s with sit to stand and gait initiation and need to sit and rest prior to attempting gait again. Took exceedingly long time to recover, was cued and coached for pursed lip breathing but was not effectively able to complete to regain saturations as previous. Pt able to ambulate approx 48f today with min/min guard assist, some balance loses noted. Pt desat to 536swih ambulation on 8L/min via HFNC, needed increase to 10L/min, 6 mins and NRB at 15L to recover again to high 80s. Pt initially seemed to be hopsula that she would go home in a few days and was ready for tx but as soon as began mobility went to anxious mode and remained there throughout session. At end of session pt was able to sit in reliner and return to 10L/min via HFNC and sats in 87-89% range.     Follow Up Recommendations  Other (comment)(may need SNF if does not improve activity tolerance)     Equipment Recommendations  None recommended by PT    Recommendations for Other Services       Precautions / Restrictions Precautions Precautions: Fall;Other (comment) Precaution Comments: desat w/ min activity, highly anxious  Restrictions Weight Bearing Restrictions: No    Mobility  Bed Mobility Overal bed mobility: Needs  Assistance Bed Mobility: Supine to Sit Rolling: Supervision         General bed mobility comments: desat to low 80s on 5L/min with supine to sit, took exceedingly long time and also increase to 6L/min to recover sats to high 80s  Transfers Overall transfer level: Needs assistance Equipment used: None Transfers: Sit to/from SOmnicareSit to Stand: Min guard Stand pivot transfers: Min guard       General transfer comment: desat to 70s with sit to stand and attempted ambulation initiation, needed return to sitting edge of bed, increase to 8L/min and approx 547ms of pursed lip breathing to increase to 87%, probe moved from earlobe to finger and back multi times with same results  Ambulation/Gait Ambulation/Gait assistance: Min assist;Min guard Gait Distance (Feet): 36 Feet Assistive device: 1 person hand held assist Gait Pattern/deviations: Step-through pattern Gait velocity: decreased   General Gait Details: 36 ft with no AD, min lobs noted needed min/min guard assist to complete, desat to 50s once complete, increased to 10L/min, waited approx 1m17m with purse dlip breathing, pt completing ineffectively taking very short shallow breaths. Attempted to educate and deonstrate but pt not able to complete today. At 6 min and sats in high 70s pt placed on 15L NRB and sats increased to 100%, pt left on this another approx 5 mins then returned to HFNC at 10L, sats dropped but only to min 87%   StaChief Strategy Officer  Modified Rankin (Stroke Patients Only)       Balance Overall balance assessment: Needs assistance Sitting-balance support: Feet supported Sitting balance-Leahy Scale: Good     Standing balance support: During functional activity Standing balance-Leahy Scale: Fair                              Cognition Arousal/Alertness: Lethargic Behavior During Therapy: Anxious Overall Cognitive Status: No family/caregiver  present to determine baseline cognitive functioning                                 General Comments: seems to be in heightened anxious mode today, initially was fine and cheerful but as soon as started mobility descended into this mode      Exercises Other Exercises Other Exercises: flutter valve to increase 02 sats    General Comments        Pertinent Vitals/Pain Pain Assessment: No/denies pain    Home Living                      Prior Function            PT Goals (current goals can now be found in the care plan section) Acute Rehab PT Goals Patient Stated Goal: was excited to be going home in a few days PT Goal Formulation: With patient Time For Goal Achievement: 09/14/19 Potential to Achieve Goals: Fair    Frequency    Min 4X/week      PT Plan Discharge plan needs to be updated    Co-evaluation              AM-PAC PT "6 Clicks" Mobility   Outcome Measure  Help needed turning from your back to your side while in a flat bed without using bedrails?: None Help needed moving from lying on your back to sitting on the side of a flat bed without using bedrails?: None Help needed moving to and from a bed to a chair (including a wheelchair)?: A Little Help needed standing up from a chair using your arms (e.g., wheelchair or bedside chair)?: A Little Help needed to walk in hospital room?: A Lot Help needed climbing 3-5 steps with a railing? : A Lot 6 Click Score: 18    End of Session Equipment Utilized During Treatment: Oxygen Activity Tolerance: Treatment limited secondary to medical complications (Comment);Patient limited by fatigue Patient left: in chair;with call bell/phone within reach Nurse Communication: Mobility status;Other (comment)(desaturation with activity) PT Visit Diagnosis: Unsteadiness on feet (R26.81);Other abnormalities of gait and mobility (R26.89);Muscle weakness (generalized) (M62.81)     Time: 1100-1145 PT  Time Calculation (min) (ACUTE ONLY): 45 min  Charges:  $Therapeutic Activity: 23-37 mins $Self Care/Home Management: [place], PT    Delford Field 09/02/2019, 1:42 PM

## 2019-09-02 NOTE — Progress Notes (Signed)
ANTICOAGULATION CONSULT NOTE - Initial Consult  Pharmacy Consult for Eliquis Indication: DVT  No Known Allergies  Patient Measurements: Height: _0  (167.6 cm) Weight: 158 lb 8.2 oz (71.9 kg) IBW/kg (Calculated) : 59.3  Vital Signs: Temp: 97.5 F (36.4 C) (02/25 0805) Temp Source: Oral (02/25 0326) BP: 108/58 (02/25 0805) Pulse Rate: 80 (02/25 1049)  Labs: Recent Labs    09/01/19 0250 09/02/19 0238  HGB  --  12.6  HCT  --  42.1  PLT  --  433*  CREATININE 0.60 0.76    Estimated Creatinine Clearance: 71.2 mL/min (by C-G formula based on SCr of 0.76 mg/dL).   Medical History: Past Medical History:  Diagnosis Date  . GERD (gastroesophageal reflux disease)   . Pneumonia   . Pulmonary fibrosis (HCC)     Medications:  Scheduled:  . apixaban  10 mg Oral BID   Followed by  . [START ON 09/09/2019] apixaban  5 mg Oral BID  . vitamin C  500 mg Oral Daily  . budesonide  1 puff Inhalation BID  . Ipratropium-Albuterol  1 puff Inhalation Q6H  . multivitamin with minerals  1 tablet Oral Daily  . pantoprazole  40 mg Oral Daily  . Pirfenidone  801 mg Oral TID  . zinc sulfate  220 mg Oral Daily    Assessment: 64 y/o F admitted for COVID-19 PNA on enoxaparin for RLE DVT. Last dose of Lovenox this AM.    Plan:  Transition to Eliquis 10 mg bid x 7 days followed by 5 mg bid thereafter  Ulice Dash D 09/02/2019,2:35 PM

## 2019-09-02 NOTE — Plan of Care (Signed)
  Problem: Education: Goal: Knowledge of risk factors and measures for prevention of condition will improve Outcome: Progressing   Problem: Coping: Goal: Psychosocial and spiritual needs will be supported Outcome: Progressing   Problem: Respiratory: Goal: Will maintain a patent airway Outcome: Progressing Goal: Complications related to the disease process, condition or treatment will be avoided or minimized Outcome: Progressing   Problem: Clinical Measurements: Goal: Will remain free from infection Outcome: Progressing   Problem: Activity: Goal: Risk for activity intolerance will decrease Outcome: Progressing   Problem: Safety: Goal: Ability to remain free from injury will improve Outcome: Progressing   Problem: Skin Integrity: Goal: Risk for impaired skin integrity will decrease Outcome: Progressing

## 2019-09-03 MED ORDER — CEPASTAT 14.5 MG MT LOZG
1.0000 | LOZENGE | OROMUCOSAL | Status: DC | PRN
  Administered 2019-09-03: 1 via BUCCAL
  Filled 2019-09-03: qty 9

## 2019-09-03 NOTE — Progress Notes (Signed)
Occupational Therapy Treatment Patient Details Name: Stacy [name] MRN: [id number] DOB: 15-May-1956 Today's Date: 09/03/2019    History of present illness 64 year old with history of GERD, pulmonary fibrosis with chronic hypoxia on 4 L nasal cannula at home on Imuran and pirfenidone presented to the ED with shortness of breath on 2/11 found to have COVID-19 pneumonia.  CTA chest showed pulmonary fibrosis with reactive lymphadenopathy but no evidence of pulmonary embolism.  Completed course of remdesivir 2/17 and received convalescent plasma 2/15.  Not a candidate for Actemra as patient was on Imuran as outpatient.   OT comments  Patient supine in bed on arrival.  She had 15L HFNC on and SpO2 was 92 at rest.  Patient was very anxious about getting up and was stating she did not want to participate, but then requested to use the [facility].  She required 15L NRB to keep her SpO2 in the low 90s on BSC.  Had to give max cues and reminders to do pursed lip breathing and "slow, easy breaths".  Required relaxation techniques due to anxiety.  Able to transfer with min assist and needed mod assist toileting.  Once seated back at EOB patient desat to 69.  After 5 min of relaxing her breathing patient able to come back up to low 90s.  Will continue to follow with OT acutely to address the deficits listed below.     Follow Up Recommendations  Home health OT;Other (comment)(Potential SNF pending progress)    Equipment Recommendations       Recommendations for Other Services      Precautions / Restrictions Precautions Precautions: Fall;Other (comment) Precaution Comments: desat w/ min activity, highly anxious  Restrictions Weight Bearing Restrictions: No       Mobility Bed Mobility Overal bed mobility: Needs Assistance Bed Mobility: Supine to Sit     Supine to sit: Supervision Sit to supine: Supervision      Transfers Overall transfer level: Needs assistance Equipment used: None Transfers: Sit  to/from Stand;Stand Pivot Transfers Sit to Stand: Min guard Stand pivot transfers: Min guard            Balance Overall balance assessment: Needs assistance Sitting-balance support: Feet supported Sitting balance-Leahy Scale: Good     Standing balance support: During functional activity Standing balance-Leahy Scale: Fair                             ADL either performed or assessed with clinical judgement   ADL Overall ADL's : Needs assistance/impaired     Grooming: Wash/dry hands;Wash/dry face;Sitting;Set up               Lower Body Dressing: Moderate assistance;Sit to/from stand   Toilet Transfer: BSC;Min guard   Toileting- Water quality scientist and Hygiene: Moderate assistance;Sit to/from stand       Functional mobility during ADLs: Min guard General ADL Comments: Only transfered to [facility]      Vision       Perception     Praxis      Cognition Arousal/Alertness: Lethargic Behavior During Therapy: Anxious Overall Cognitive Status: No family/caregiver present to determine baseline cognitive functioning                                 General Comments: Very anxious about getting out of bed.  Says she cannot tdo the pursed breathing adn it makes her feel too anxious.  Said she si ready to give up and "wants to die"        Exercises     Shoulder Instructions       General Comments      Pertinent Vitals/ Pain       Pain Assessment: Faces Faces Pain Scale: Hurts little more Pain Location: General discomfort Pain Descriptors / Indicators: Moaning;Discomfort  Home Living                                          Prior Functioning/Environment              Frequency  Min 3X/week        Progress Toward Goals  OT Goals(current goals can now be found in the care plan section)  Progress towards OT goals: Progressing toward goals  Acute Rehab OT Goals Patient Stated Goal: Wants to leave OT Goal  Formulation: With patient Time For Goal Achievement: 09/14/19 Potential to Achieve Goals: Good  Plan Discharge plan remains appropriate    Co-evaluation                 AM-PAC OT "6 Clicks" Daily Activity     Outcome Measure   Help from another person eating meals?: None Help from another person taking care of personal grooming?: A Little Help from another person toileting, which includes using toliet, bedpan, or urinal?: A Lot Help from another person bathing (including washing, rinsing, drying)?: A Lot Help from another person to put on and taking off regular upper body clothing?: A Little Help from another person to put on and taking off regular lower body clothing?: A Lot 6 Click Score: 16    End of Session Equipment Utilized During Treatment: Oxygen  OT Visit Diagnosis: Unsteadiness on feet (R26.81);Muscle weakness (generalized) (M62.81)   Activity Tolerance Patient limited by fatigue   Patient Left     Nurse Communication          Time: 2393-5940 OT Time Calculation (min): 49 min  Charges: OT General Charges $OT Visit: 1 Visit OT Treatments $Self Care/Home Management : 38-52 mins  August Luz, OTR/L    Phylliss Bob 09/03/2019, 10:41 AM

## 2019-09-03 NOTE — Progress Notes (Signed)
Stacy [name]  BZJ:[id number] DOB: 1956/03/23 DOA: 08/21/2019 PCP: Marrian Salvage, [name]    Brief Narrative:  64 year old with a history of 4 L O2 dependent pulmonary fibrosis (on imuran and pirfenidone chronically) who presented to the ED 2/13 with shortness of breath and a known Covid infection diagnosed 2/11.  Since admission she has been treated with remdesivir, steroids, and 2 units of convalescent plasma.  She has been diagnosed with extensive acute right lower extremity DVTs during his hospital stay.  Significant Events: 2/11 Covid test positive 2/13 admit to [facility] via [facility] ED  COVID-19 specific Treatment: Remdesivir 2/13 > 2/17 Decadron 2/13 > 2/23 Convalescent plasma 2/15  Antimicrobials:  None  Subjective: She is still requiring HFNC support with attempts to ambulate, at times up to 10L HFNC and even NRB transiently due to sats below 60%. Anxiety leading to poor rep mechanics remains a challenge.   She is highly motivated to be at home, where she is happiest. She asked me if home Hospice support was an option. She is agreeable to investigating her tx options with Palliative Care.   Assessment & Plan:  COVID Pneumonia -acute on chronic hypoxic respiratory failure CTa negative for pulmonary embolism -has completed remdesivir and Decadron treatment courses -was dosed with convalescent plasma -slow to improve and still requires significantly more oxygen support than her baseline -continue to slowly wean    Chronic hypoxic respiratory failure -pulmonary fibrosis Chronically on pirfenidone - Imuran on hold during hospital stay -will need to follow-up with her Pulmonologist after discharge  Acute extensive right lower extremity DVT Peroneal, gastrocnemius, and popliteal veins -on therapeutic lovenox - transition to Eliquis today   GERD  Mild hypokalemia Corrected to target of 4.0 with supplementation  DVT prophylaxis: Therapeutic Lovenox > Eliquis Code  Status: DO NOT INTUBATE Family Communication:  Disposition Plan: The patient lives alone - she wishes to consider her options for returning home   Consultants:  Palliative Care   Objective: Blood pressure 117/83, pulse 81, temperature 98.3 F (36.8 C), temperature source Oral, resp. rate 18, height 5' 6" (1.676 m), weight 71.9 kg, SpO2 90 %.  Intake/Output Summary (Last 24 hours) at 09/03/2019 0908 Last data filed at 09/02/2019 1930 Gross per 24 hour  Intake --  Output 600 ml  Net -600 ml   Filed Weights   08/21/19 1232 08/21/19 2217  Weight: 68 kg 71.9 kg    Examination: General: increased work of breathing even at rest  Lungs: Fine diffuse crackles - poor air movement in all fields  Cardiovascular: RRR Abdomen: NT/ND, soft, BS positive, no rebound Extremities: No edema B LE   CBC: Recent Labs  Lab 08/29/19 0140 08/30/19 0110 09/02/19 0238  WBC 10.6* 12.1* 12.7*  HGB 12.1 12.9 12.6  HCT 38.8 40.5 42.1  MCV 74.3* 73.6* 76.4*  PLT 492* 497* 017*   Basic Metabolic Panel: Recent Labs  Lab 08/28/19 0620 08/28/19 0620 08/29/19 0140 08/29/19 0140 08/30/19 0110 09/01/19 0250 09/02/19 0238  NA 142   < > 144   < > 140 141 142  K 3.9   < > 3.8   < > 3.5 3.4* 4.0  CL 98   < > 97*   < > 96* 96* 98  CO2 30   < > 33*   < > 32 32 33*  GLUCOSE 89   < > 87   < > 110* 83 88  BUN 19   < > 19   < >  _0 CREATININE 0.68   < > 0.64   < > 0.58 0.60 0.76  CALCIUM 9.4   < > 9.5   < > 9.4 9.2 9.1  MG 2.1  --  2.3  --  2.1  --   --    < > = values in this interval not displayed.   GFR: Estimated Creatinine Clearance: 71.2 mL/min (by C-G formula based on SCr of 0.76 mg/dL).  Liver Function Tests: No results for input(s): AST, ALT, ALKPHOS, BILITOT, PROT, ALBUMIN in the last 168 hours.  Scheduled Meds: . apixaban  10 mg Oral BID   Followed by  . [START ON 09/09/2019] apixaban  5 mg Oral BID  . vitamin C  500 mg Oral Daily  . budesonide  1 puff Inhalation BID  .  Ipratropium-Albuterol  1 puff Inhalation Q6H  . multivitamin with minerals  1 tablet Oral Daily  . pantoprazole  40 mg Oral Daily  . Pirfenidone  801 mg Oral TID  . zinc sulfate  220 mg Oral Daily     LOS: 13 days   Cherene Altes, MD [facility] Office  [phone] Pager - Text Page per Amion  If 7PM-7AM, please contact night-coverage per Amion 09/03/2019, 9:08 AM

## 2019-09-03 NOTE — Progress Notes (Signed)
Physical Therapy Treatment Patient Details Name: Stacy [name] MRN: [id number] DOB: 1956-06-22 Today's Date: 09/03/2019    History of Present Illness 64 year old with history of GERD, pulmonary fibrosis with chronic hypoxia on 4 L nasal cannula at home on Imuran and pirfenidone presented to the ED with shortness of breath on 2/11 found to have COVID-19 pneumonia.  CTA chest showed pulmonary fibrosis with reactive lymphadenopathy but no evidence of pulmonary embolism.  Completed course of remdesivir 2/17 and received convalescent plasma 2/15.  Not a candidate for Actemra as patient was on Imuran as outpatient.    PT Comments    Pt did not tolerate much in tx today. Was able to get to egde of bed but not able to advance sec to anxiety and subsequent desat to 50s. Initially pt was on 7L./min via HFNC and was satting in high 80s-low 90s. Pt agreeable to tx and progressing slowly throughout. Was able to get to edge of bed with stand by assist and set up and for first 1-2 mins was fine with sats in mid 80s but all of a suddenly pt desat to 60s then to 50s, pt was asked to relax and complet pursed lip breathing, attempted for approx 5 mins with increase of 02 to 15L via HFNC and pt was not able to increase saturations greater than low 60s and inconsistently to 50s. 15 L NRB was placed on pt and pt was able to recover saturation to 90s. Pt returned to supine with head of bed elevated and NRB was removed, on 15L was able to maintain sats in 90s. Pt seems to be having such high anxiety over any movement from supine (even with head of bed elevated) that it is highly limiting progress with any mobility. Not sure what has changed as pt was excited to be abel to go home in a few days only yesterday.       Follow Up Recommendations  SNF     Equipment Recommendations  None recommended by PT    Recommendations for Other Services       Precautions / Restrictions Precautions Precautions: Fall;Other  (comment) Precaution Comments: desat w/ min activity, highly anxious  Restrictions Weight Bearing Restrictions: No    Mobility  Bed Mobility Overal bed mobility: Needs Assistance Bed Mobility: Supine to Sit;Sit to Supine Rolling: Supervision   Supine to sit: Supervision     General bed mobility comments: was able to get to edge of bed and sit few mins then desat from 80s to 50s, unable to recover in 5 mins even with increase of 02 from 7L to 15L via HFNC  Transfers                    Ambulation/Gait                 Stairs             Wheelchair Mobility    Modified Rankin (Stroke Patients Only)       Balance Overall balance assessment: Needs assistance Sitting-balance support: Feet supported Sitting balance-Leahy Scale: Good                                      Cognition Arousal/Alertness: Lethargic Behavior During Therapy: Anxious Overall Cognitive Status: No family/caregiver present to determine baseline cognitive functioning  General Comments: still extremely anxious with very little mobility      Exercises Other Exercises Other Exercises: pursed lip breathing     General Comments        Pertinent Vitals/Pain Pain Assessment: No/denies pain    Home Living                      Prior Function            PT Goals (current goals can now be found in the care plan section) Acute Rehab PT Goals Patient Stated Goal: Wants to leave PT Goal Formulation: With patient Time For Goal Achievement: 09/14/19 Potential to Achieve Goals: [name] Progress towards PT goals: Not progressing toward goals - comment    Frequency    Min 4X/week      PT Plan Discharge plan needs to be updated    Co-evaluation              AM-PAC PT "6 Clicks" Mobility   Outcome Measure  Help needed turning from your back to your side while in a flat bed without using bedrails?:  None Help needed moving from lying on your back to sitting on the side of a flat bed without using bedrails?: A Little Help needed moving to and from a bed to a chair (including a wheelchair)?: A Little Help needed standing up from a chair using your arms (e.g., wheelchair or bedside chair)?: A Little Help needed to walk in hospital room?: A Lot Help needed climbing 3-5 steps with a railing? : Total 6 Click Score: 16    End of Session Equipment Utilized During Treatment: Oxygen Activity Tolerance: Treatment limited secondary to medical complications (Comment);Patient limited by lethargy;Patient limited by fatigue Patient left: in bed;with call bell/phone within reach Nurse Communication: Mobility status PT Visit Diagnosis: Unsteadiness on feet (R26.81);Other abnormalities of gait and mobility (R26.89);Muscle weakness (generalized) (M62.81)     Time: 0029-8473 PT Time Calculation (min) (ACUTE ONLY): 25 min  Charges:  $Therapeutic Activity: 8-22 mins $Self Care/Home Management: [place], PT    Delford Field 09/03/2019, 4:56 PM

## 2019-09-04 DIAGNOSIS — Z7189 Other specified counseling: Secondary | ICD-10-CM

## 2019-09-04 DIAGNOSIS — Z515 Encounter for palliative care: Secondary | ICD-10-CM

## 2019-09-04 MED ORDER — MORPHINE SULFATE 10 MG/5ML PO SOLN
2.5000 mg | ORAL | Status: DC | PRN
  Administered 2019-09-07 – 2019-09-09 (×2): 2.5 mg via ORAL
  Filled 2019-09-04 (×2): qty 2

## 2019-09-04 MED ORDER — FUROSEMIDE 10 MG/ML IJ SOLN
40.0000 mg | Freq: Four times a day (QID) | INTRAMUSCULAR | Status: AC
  Administered 2019-09-04 (×2): 40 mg via INTRAVENOUS
  Filled 2019-09-04 (×2): qty 4

## 2019-09-04 MED ORDER — MORPHINE SULFATE 10 MG/5ML PO SOLN: 2.5000 mg | ORAL | Status: DC | PRN

## 2019-09-04 MED ORDER — CLONAZEPAM 0.5 MG PO TABS: 0.2500 mg | ORAL_TABLET | Freq: Two times a day (BID) | ORAL | Status: DC

## 2019-09-04 MED ORDER — POLYETHYLENE GLYCOL 3350 17 G PO PACK
17.0000 g | PACK | Freq: Two times a day (BID) | ORAL | Status: DC | PRN
  Administered 2019-09-04 – 2019-09-05 (×3): 17 g via ORAL
  Filled 2019-09-04 (×3): qty 1

## 2019-09-04 MED ORDER — PREDNISONE 10 MG PO TABS
5.0000 mg | ORAL_TABLET | Freq: Every day | ORAL | Status: DC
  Administered 2019-09-05: 5 mg via ORAL
  Filled 2019-09-04: qty 1

## 2019-09-04 MED ORDER — CLONAZEPAM 0.5 MG PO TABS
0.2500 mg | ORAL_TABLET | Freq: Two times a day (BID) | ORAL | Status: DC
  Administered 2019-09-04 – 2019-09-06 (×5): 0.25 mg via ORAL
  Filled 2019-09-04 (×6): qty 1

## 2019-09-04 NOTE — Progress Notes (Addendum)
Occupational Therapy Treatment Patient Details Name: Stacy [name] MRN: [id number] DOB: 1955/07/27 Today's Date: 09/04/2019    History of present illness 64 year old with history of GERD, pulmonary fibrosis with chronic hypoxia on 4 L nasal cannula at home on Imuran and pirfenidone presented to the ED with shortness of breath on 2/11 found to have COVID-19 pneumonia.  CTA chest showed pulmonary fibrosis with reactive lymphadenopathy but no evidence of pulmonary embolism.  Completed course of remdesivir 2/17 and received convalescent plasma 2/15.  Not a candidate for Actemra as patient was on Imuran as outpatient.   OT comments  Patient in chair on arrival.  Though still anxious, she was very pleasant today and much more relaxed than previous sessions.  Still required relaxation techniques and "slow, easy, deep" breathing coaching, but she initiated several times independently and did not go straight into short/shallow breathing.  She was on 10L O2 HFNC at rest with SpO2 around 90.  Added 15L NRB in preparation for standing.  With this patient desat to 80 when standing but able to stay calm and pursed lip breath and then maintain 90 SpO2.  Patient completed grooming and UE bathing while standing at sink (~6 min) with min guard.  After this she became too tired to stand.  Finished bathing with sit <> stands and min guard.  Switched back to just 10L HFNC after stands.  Also washed hair with shampoo cap while seated.  Patient showing great improvement overall since previous sessions and motivation has increased.  Patient would benefit from continued OT to further improvement and independence.    Follow Up Recommendations  Home health OT;Other (comment)    Equipment Recommendations  None recommended by OT    Recommendations for Other Services      Precautions / Restrictions Precautions Precautions: Fall;Other (comment) Precaution Comments: desat w/ min activity, highly anxious   Restrictions Weight Bearing Restrictions: No       Mobility Bed Mobility               General bed mobility comments: Up in chair  Transfers Overall transfer level: Needs assistance Equipment used: None Transfers: Sit to/from Stand;Stand Pivot Transfers Sit to Stand: Min guard Stand pivot transfers: Min guard            Balance Overall balance assessment: Needs assistance Sitting-balance support: Feet supported Sitting balance-Leahy Scale: Good     Standing balance support: During functional activity;No upper extremity supported Standing balance-Leahy Scale: Fair Standing balance comment: 1 LOB noted, able to correct on own                           ADL either performed or assessed with clinical judgement   ADL Overall ADL's : Needs assistance/impaired Eating/Feeding: Sitting;Independent   Grooming: Wash/dry hands;Wash/dry face;Min guard;Standing   Upper Body Bathing: Standing;Min guard   Lower Body Bathing: Min guard;Sit to/from stand       Lower Body Dressing: Minimal assistance;Sit to/from stand               Functional mobility during ADLs: Min guard       Vision       Perception     Praxis      Cognition Arousal/Alertness: Awake/alert Behavior During Therapy: Anxious Overall Cognitive Status: Within Functional Limits for tasks assessed  General Comments: Not nearly as anxious as previous visits. RN administered xanax mid way through session        Exercises Exercises: Other exercises Other Exercises Other Exercises: pursed lip breathing    Shoulder Instructions       General Comments      Pertinent Vitals/ Pain       Pain Assessment: No/denies pain  Home Living                                          Prior Functioning/Environment              Frequency  Min 3X/week        Progress Toward Goals  OT Goals(current goals can now  be found in the care plan section)  Progress towards OT goals: Progressing toward goals  Acute Rehab OT Goals Patient Stated Goal: Wants to leave OT Goal Formulation: With patient Time For Goal Achievement: 09/14/19 Potential to Achieve Goals: Good  Plan Discharge plan remains appropriate    Co-evaluation                 AM-PAC OT "6 Clicks" Daily Activity     Outcome Measure   Help from another person eating meals?: None Help from another person taking care of personal grooming?: A Little Help from another person toileting, which includes using toliet, bedpan, or urinal?: A Little Help from another person bathing (including washing, rinsing, drying)?: A Little Help from another person to put on and taking off regular upper body clothing?: A Little Help from another person to put on and taking off regular lower body clothing?: A Little 6 Click Score: 19    End of Session Equipment Utilized During Treatment: Oxygen  OT Visit Diagnosis: Unsteadiness on feet (R26.81);Muscle weakness (generalized) (M62.81)   Activity Tolerance Patient limited by fatigue   Patient Left in chair;with call bell/phone within reach   Nurse Communication Mobility status        Time: 1388-7195 OT Time Calculation (min): 72 min  Charges: OT General Charges $OT Visit: 1 Visit OT Treatments $Self Care/Home Management : 68-82 mins  August Luz, OTR/L    Phylliss Bob 09/04/2019, 1:17 PM

## 2019-09-04 NOTE — Consult Note (Signed)
Consultation Note Date: 09/04/2019   Patient Name: Stacy [name]  DOB: 1956/04/21  MRN: [id number]  Age / Sex: 64 y.o., female  PCP: Marrian Salvage, [place] Referring Physician: Cherene Altes, MD  Reason for Consultation: Establishing goals of care  HPI/Patient Profile: 64 y.o. female  with past medical history of pulmonary fibrosis, pneumonia, GERD admitted on 08/21/2019 with worsening shortness of breath with hypoxia related to COVID pneumonia still with high oxygen requirements complicated but severe anxiety and desaturation with any activity.   Clinical Assessment and Goals of Care: I met today with Stacy [name] who is up in the recliner and working in a puzzle book. She is alert does not appear anxious or with labored breathing. She is alert, oriented and very aware of her health condition and circumstances.   She tells me that she feels in better spirits today and a little more optimistic. Yesterday "I just wanted to go home and die." She confirms that she was inquiring about hospice services and would like information even though she is not yet ready for this at this time. I explained the benefits and support of hospice with providing nurses and bath aides and available to help her to manage symptoms at home with a goal to optimize her quality of life. They can usually accommodate up to 10L oxygen at home. We discussed hospice is helpful when people get tired and no longer want to continue on with aggressive care or when they have increased symptom burden with advanced disease process. She does tell me that she does feel like her time is limited.   She has 2 daughters and 1 son. One of her daughters lives in her same apartment complex and is a good support to her but she does live alone. She did share that she was considering coming home with hospice with them and they were understandably upset that  concerned. She wishes to wait and see if she can continue to make improvements. I did clarify code status and she does confirm that she would not desire CPR or resuscitation so full DNR placed.   All questions/concerns addressed. Emotional support provided. Discussed with Dr. Thereasa Solo.   Primary Decision Maker PATIENT    SUMMARY OF RECOMMENDATIONS   - Open to hospice at home but would like to continue with hospital interventions at this time  Code Status/Advance Care Planning:  DNR   Symptom Management:   Shortness of breath: Morphine 2.5 mg po every 4 hours as needed and may use prior to activity.   Anxiety: Already prescribed clonazepam 0.25 mg BID.   Palliative Prophylaxis:   Aspiration, Bowel Regimen, Delirium Protocol, Oral Care and Turn Reposition  Prognosis:   Overall prognosis guarded with underlying pulmonary fibrosis and recent COVID pneumonia.   Discharge Planning: To Be Determined      Primary Diagnoses: Present on Admission: . ILD (interstitial lung disease) ([place]) . Pulmonary fibrosis ([place]) . Gastroesophageal reflux disease . Pneumothorax, right   I have reviewed the medical record, interviewed the patient  and family, and examined the patient. The following aspects are pertinent.  Past Medical History:  Diagnosis Date  . GERD (gastroesophageal reflux disease)   . Pneumonia   . Pulmonary fibrosis ([facility])    Social History   Socioeconomic History  . Marital status: Divorced    Spouse name: Not on file  . Number of children: 3  . Years of education: Not on file  . Highest education level: Not on file  Occupational History  . Occupation: disabled  Tobacco Use  . Smoking status: Former Smoker    Packs/day: 2.00    Types: Cigarettes    Start date: 07/1967    Quit date: 07/1993    Years since quitting: 26.1  . Smokeless tobacco: Never Used  Substance and Sexual Activity  . Alcohol use: Yes    Comment: rarely  . Drug use: Never  . Sexual  activity: Not Currently  Other Topics Concern  . Not on file  Social History Narrative  . Not on file   Social Determinants of Health   Financial Resource Strain:   . Difficulty of Paying Living Expenses: Not on file  Food Insecurity:   . Worried About Charity fundraiser in the Last Year: Not on file  . Ran Out of Food in the Last Year: Not on file  Transportation Needs:   . Lack of Transportation (Medical): Not on file  . Lack of Transportation (Non-Medical): Not on file  Physical Activity:   . Days of Exercise per Week: Not on file  . Minutes of Exercise per Session: Not on file  Stress:   . Feeling of Stress : Not on file  Social Connections:   . Frequency of Communication with Friends and Family: Not on file  . Frequency of Social Gatherings with Friends and Family: Not on file  . Attends Religious Services: Not on file  . Active Member of Clubs or Organizations: Not on file  . Attends Archivist Meetings: Not on file  . Marital Status: Not on file   Family History  Problem Relation Age of Onset  . Diabetes Mother   . Cancer Father        unsure of primary source, farmer  . Asthma Brother    Scheduled Meds: . apixaban  10 mg Oral BID   Followed by  . [START ON 09/09/2019] apixaban  5 mg Oral BID  . vitamin C  500 mg Oral Daily  . budesonide  1 puff Inhalation BID  . clonazePAM  0.25 mg Oral BID  . furosemide  40 mg Intravenous Q6H  . Ipratropium-Albuterol  1 puff Inhalation Q6H  . multivitamin with minerals  1 tablet Oral Daily  . pantoprazole  40 mg Oral Daily  . Pirfenidone  801 mg Oral TID  . zinc sulfate  220 mg Oral Daily   Continuous Infusions: PRN Meds:.acetaminophen, chlorpheniramine-HYDROcodone, dextromethorphan-guaiFENesin, ondansetron (ZOFRAN) IV, phenol-menthol, polyethylene glycol, sodium chloride, traMADol No Known Allergies Review of Systems  Constitutional: Positive for activity change.  Respiratory: Positive for shortness of breath.    Psychiatric/Behavioral: The patient is nervous/anxious.     Physical Exam Vitals and nursing note reviewed.  Constitutional:      General: She is not in acute distress.    Appearance: She is well-developed and well-groomed.  Cardiovascular:     Rate and Rhythm: Normal rate.  Pulmonary:     Effort: No tachypnea, accessory muscle usage or respiratory distress.     Comments: Purse-lipped breathing  at times during conversation Abdominal:     General: Abdomen is flat.  Neurological:     Mental Status: She is alert and oriented to person, place, and time.     Vital Signs: BP 116/78 (BP Location: Right Arm)   Pulse 87   Temp 98 F (36.7 C) (Oral)   Resp (!) 21   Ht _0  (1.676 m)   Wt 71.9 kg   SpO2 100%   BMI 25.58 kg/m  Pain Scale: 0-10 POSS *See Group Information*: S-Acceptable,Sleep, easy to arouse Pain Score: 3    SpO2: SpO2: 100 % O2 Device:SpO2: 100 % O2 Flow Rate: .O2 Flow Rate (L/min): 15 L/min  IO: Intake/output summary:   Intake/Output Summary (Last 24 hours) at 09/04/2019 1339 Last data filed at 09/04/2019 0530 Gross per 24 hour  Intake 2780 ml  Output 250 ml  Net 2530 ml    LBM: Last BM Date: 09/03/19 Baseline Weight: Weight: 68 kg Most recent weight: Weight: 71.9 kg     Palliative Assessment/Data:     Time In: 1410 Time Out: 1500 Time Total: 50 min Greater than 50%  of this time was spent counseling and coordinating care related to the above assessment and plan.  Signed by: Vinie Sill, NP Palliative Medicine Team Pager # [phone] (M-F 8a-5p) Team Phone # [phone] (Nights/Weekends)

## 2019-09-04 NOTE — Progress Notes (Addendum)
Stacy [name]  OHF:[id number] DOB: 02-Oct-1955 DOA: 08/21/2019 PCP: Marrian Salvage, [name]    Brief Narrative:  64 year old with a history of 4 L O2 dependent pulmonary fibrosis (on imuran and pirfenidone chronically) who presented to the ED 2/13 with shortness of breath and a known Covid infection diagnosed 2/11.  Since admission she has been treated with remdesivir, steroids, and 2 units of convalescent plasma.  She has been diagnosed with extensive acute right lower extremity DVTs during his hospital stay.  Significant Events: 2/11 Covid test positive 2/13 admit to [facility] via [facility] ED  COVID-19 specific Treatment: Remdesivir 2/13 > 2/17 Decadron 2/13 > 2/23 Convalescent plasma 2/15  Antimicrobials:  None  Subjective: Doing very poorly working with therapy related to profound desaturation and severe anxiety. Requiring HFNC and addition of NRB at times to recover during attempted ambulation. No chest pain or current sob at time of my exam.   Assessment & Plan:  COVID Pneumonia -acute on chronic hypoxic respiratory failure CTa negative for pulmonary embolism -has completed remdesivir and Decadron treatment courses -was dosed with convalescent plasma -slow to improve and still requires significantly more oxygen support than her baseline - continue to slowly wean O2 as able, though progress is very slow    Chronic hypoxic respiratory failure -pulmonary fibrosis Chronically on pirfenidone - Imuran on hold during hospital stay - given very slow progress, with intermittent setbacks, I will ask her Pumonary Team to weigh in and see if they have any suggestions regarding tx - adding low dose klonopin today, as anxiety worsening her resp mechanics - attempting diuresis today as pt +5.7L since admit - now that off decadron have resumed usual low dose daily prednisone - timing of resuming Imuran per Pulm   Acute extensive right lower extremity DVT Peroneal, gastrocnemius, and  popliteal veins - on Eliqus   GERD  Mild hypokalemia Corrected to target of 4.0 with supplementation  DVT prophylaxis: Therapeutic Lovenox > Eliquis Code Status: DO NOT INTUBATE Family Communication:  Disposition Plan: The patient lives alone - still requiring high level O2 support - will likely require transfer to [facility] to allow continued weaning of O2 support before d/c   Consultants:  Palliative Care   Objective: Blood pressure 116/78, pulse 87, temperature 98 F (36.7 C), temperature source Oral, resp. rate (!) 21, height _0  (1.676 m), weight 71.9 kg, SpO2 100 %.  Intake/Output Summary (Last 24 hours) at 09/04/2019 0909 Last data filed at 09/04/2019 0530 Gross per 24 hour  Intake 2780 ml  Output 250 ml  Net 2530 ml   Filed Weights   08/21/19 1232 08/21/19 2217  Weight: 68 kg 71.9 kg    Examination: General: alert and calm at time of my exam  Lungs: Fine diffuse crackles w/o change - no wheezing  Cardiovascular: RRR Abdomen: NT/ND, soft, BS positive, no rebound Extremities: No edema B lower extremities   CBC: Recent Labs  Lab 08/29/19 0140 08/30/19 0110 09/02/19 0238  WBC 10.6* 12.1* 12.7*  HGB 12.1 12.9 12.6  HCT 38.8 40.5 42.1  MCV 74.3* 73.6* 76.4*  PLT 492* 497* 208*   Basic Metabolic Panel: Recent Labs  Lab 08/29/19 0140 08/29/19 0140 08/30/19 0110 09/01/19 0250 09/02/19 0238  NA 144   < > 140 141 142  K 3.8   < > 3.5 3.4* 4.0  CL 97*   < > 96* 96* 98  CO2 33*   < > 32 32 33*  GLUCOSE 87   < >  110* 83 88  BUN 19   < > _0 CREATININE 0.64   < > 0.58 0.60 0.76  CALCIUM 9.5   < > 9.4 9.2 9.1  MG 2.3  --  2.1  --   --    < > = values in this interval not displayed.   GFR: Estimated Creatinine Clearance: 71.2 mL/min (by C-G formula based on SCr of 0.76 mg/dL).  Liver Function Tests: No results for input(s): AST, ALT, ALKPHOS, BILITOT, PROT, ALBUMIN in the last 168 hours.  Scheduled Meds: . apixaban  10 mg Oral BID   Followed by  .  [START ON 09/09/2019] apixaban  5 mg Oral BID  . vitamin C  500 mg Oral Daily  . budesonide  1 puff Inhalation BID  . Ipratropium-Albuterol  1 puff Inhalation Q6H  . multivitamin with minerals  1 tablet Oral Daily  . pantoprazole  40 mg Oral Daily  . Pirfenidone  801 mg Oral TID  . zinc sulfate  220 mg Oral Daily     LOS: 14 days   Cherene Altes, MD [facility] Office  [phone] Pager - Text Page per Amion  If 7PM-7AM, please contact night-coverage per Amion 09/04/2019, 9:09 AM

## 2019-09-04 NOTE — Progress Notes (Signed)
Pt unable to tolerate being off HFNC and NRB throughout night. When just on HFNC at 15L oxygen saturation in high 70-mid80s. While on HFNC and NRB oxygen saturation 95-100%. She gets very out of breath with small movements in body position, refusing to get out of bed to [facility].

## 2019-09-05 DIAGNOSIS — J9601 Acute respiratory failure with hypoxia: Secondary | ICD-10-CM

## 2019-09-05 DIAGNOSIS — J849 Interstitial pulmonary disease, unspecified: Secondary | ICD-10-CM

## 2019-09-05 DIAGNOSIS — J84112 Idiopathic pulmonary fibrosis: Secondary | ICD-10-CM

## 2019-09-05 DIAGNOSIS — U071 COVID-19: Principal | ICD-10-CM

## 2019-09-05 MED ORDER — BISACODYL 10 MG RE SUPP
10.0000 mg | Freq: Every day | RECTAL | Status: DC | PRN
  Administered 2019-09-05: 10 mg via RECTAL
  Filled 2019-09-05: qty 1

## 2019-09-05 NOTE — Progress Notes (Signed)
Pt informed of imminent transfer to [facility] sometime in the afternoon.  Pt reported already being aware of move and has updated daughter.  RN called and spoke with daughter, Camila Li, and updated her on condition and transfer plans.

## 2019-09-05 NOTE — Plan of Care (Signed)
Plan of Care reviewed.

## 2019-09-05 NOTE — Consult Note (Addendum)
NAME:  Stacy [name], MRN:  [id number], DOB:  1955/08/20, LOS: 40 ADMISSION DATE:  08/21/2019, CONSULTATION DATE:  09/05/2019 REFERRING MD:  Dr. Thereasa Solo, CHIEF COMPLAINT:  ILD    History of present illness   64 yo FM, past medical history of gastroesophageal reflux disease, pulmonary fibrosis.  Patient has baseline 4 L oxygen dependent chronic hypoxemic respiratory failure.  Prior to admission had an established care in consultation with Dr. Kimber Relic for interstitial lung disease.  She had biopsy-proven UIP completed at [facility].  Initially followed by Dr. Allean Found.  At the time had been on Imuran and prednisone.  Also had a trial of off of at one point in time but this was stopped due to gastrointestinal symptoms.  Patient was admitted to [facility] on 08/21/2019 after being found Covid positive on 08/19/2019.  She was treated with remdesivir, steroids, 2 units of convalescent plasma.  She was also diagnosed with extensive right lower extremity DVT during hospitalization.  Pulmonary was consulted for recommendations of management regarding her antifibrotic therapy and/or continuation of Imuran at time of discharge.  Past Medical History   Past Medical History:  Diagnosis Date  . GERD (gastroesophageal reflux disease)   . Pneumonia   . Pulmonary fibrosis ([place])      Significant Hospital Events   Remdesivir 213 through 217 Decadron 213 through 223 Convalescent plasma 215  Consults:  Pulmonary  Procedures:  None  Significant Diagnostic Tests:  03/10/2019: Echocardiogram normal LVEF 60 to 65%, normal right ventricular systolic function 3/40/3709: Right DVT, right popliteal, right peroneal and right gastrocnemius.  Micro Data:  08/19/2019: COVID-19 positive  Antimicrobials:  None  Interim history/subjective:  Patient anxious about her slow recovery.  Still requiring significant amount of oxygen.  Denies chest pains.  Objective   Blood pressure 101/75, pulse  82, temperature 97.7 F (36.5 C), temperature source Oral, resp. rate 20, height _0  (1.676 m), weight 71.9 kg, SpO2 95 %.        Intake/Output Summary (Last 24 hours) at 09/05/2019 1036 Last data filed at 09/04/2019 1800 Gross per 24 hour  Intake 240 ml  Output 1000 ml  Net -760 ml   Filed Weights   08/21/19 1232 08/21/19 2217  Weight: 68 kg 71.9 kg    Examination: General: Elderly female, resting comfortably in bed no distress.  On high flow nasal cannula able to speak in complete sentences HENT: NCAT, sclera clear, tracking appropriately Lungs: Bilateral basilar inspiratory crackles Cardiovascular: Regular rate rhythm, S1-S2 Abdomen: Soft nontender nondistended Extremities: No significant edema Neuro: Alert oriented following commands no focal deficit moves all 4 extremities GU: Deferred  CT chest imaging 08/21/2019: Patient with significant areas of peripheral groundglass on top of chronic changes consistent with pulmonary fibrosis lower lobe predominant disease with traction bronchiectasis and areas of honeycombing. The patient's images have been independently reviewed by me.    Resolved Hospital Problem list     Assessment & Plan:   Acute on chronic hypoxemic respiratory failure secondary to acute on chronic exacerbation of interstitial lung disease in the setting of acute COVID-19 pneumonia that is slowly resolving, history includes biopsy-proven UIP consistent with IPF. Hospital course complicated by right lower extremity DVT, due to underlying illness, likely COVID-19 and hypercoagulable state.  CTA negative for PE. -Patient has been evaluated by our interstitial lung disease clinic and Dr. Kimber Relic. -Patient is lab work has been reviewed and the serologic work-up for connective tissue disease is negative.  The only indication  for continuation of immune suppression with pulmonary fibrotic disease would include a concern for IPAF or ILD in relation to a mixed connective  tissue disease.  With negative serologic work-up would recommend discontinuation of Imuran at time of discharge. -She can follow-up with Dr. Kimber Relic at time of discharge in the ILD clinic. -She can continue on pirfenidone. -She will need to remain on full anticoagulation due to DVT -Due to the severity of the patient's lung disease I suspect it will take some time with a very slow recovery. -Continue early mobility and PT OT involvement. -We appreciate consultation please do not hesitate to call with any questions.   Labs   CBC: Recent Labs  Lab 08/30/19 0110 09/02/19 0238  WBC 12.1* 12.7*  HGB 12.9 12.6  HCT 40.5 42.1  MCV 73.6* 76.4*  PLT 497* 433*    Basic Metabolic Panel: Recent Labs  Lab 08/30/19 0110 09/01/19 0250 09/02/19 0238  NA 140 141 142  K 3.5 3.4* 4.0  CL 96* 96* 98  CO2 32 32 33*  GLUCOSE 110* 83 88  BUN _0 CREATININE 0.58 0.60 0.76  CALCIUM 9.4 9.2 9.1  MG 2.1  --   --    GFR: Estimated Creatinine Clearance: 71.2 mL/min (by C-G formula based on SCr of 0.76 mg/dL). Recent Labs  Lab 08/30/19 0110 09/01/19 0250 09/02/19 0238  PROCALCITON  --  <0.10  --   WBC 12.1*  --  12.7*    Liver Function Tests: No results for input(s): AST, ALT, ALKPHOS, BILITOT, PROT, ALBUMIN in the last 168 hours. No results for input(s): LIPASE, AMYLASE in the last 168 hours. No results for input(s): AMMONIA in the last 168 hours.  ABG No results found for: PHART, PCO2ART, PO2ART, HCO3, TCO2, ACIDBASEDEF, O2SAT   Coagulation Profile: No results for input(s): INR, PROTIME in the last 168 hours.  Cardiac Enzymes: No results for input(s): CKTOTAL, CKMB, CKMBINDEX, TROPONINI in the last 168 hours.  HbA1C: No results found for: HGBA1C  CBG: No results for input(s): GLUCAP in the last 168 hours.  Results for Stacy, [name] (MRN [id number]) as of 09/05/2019 12:12  Ref. Range 09/30/2018 11:48  Anti Nuclear Antibody (ANA) Latest Ref Range: NEGATIVE  NEGATIVE   Cyclic Citrullin Peptide Ab Latest Units: UNITS <16  RA Latex Turbid. Latest Ref Range: <14 IU/mL <14  ANA,IFA RA DIAG PNL W/RFLX TIT/PATN Unknown Rpt  SSA (Ro) (ENA) Antibody, IgG Latest Ref Range: <1.0 NEG AI <1.0 NEG  SSB (La) (ENA) Antibody, IgG Latest Ref Range: <1.0 NEG AI <1.0 NEG  Scleroderma (Scl-70) (ENA) Antibody, IgG Latest Ref Range: <1.0 NEG AI <1.0 NEG  Labs reviewed: Octavio Graves Normajean Nash    Review of Systems:   Review of Systems  Constitutional: Positive for malaise/fatigue. Negative for chills, fever and weight loss.  HENT: Negative for hearing loss, sore throat and tinnitus.   Eyes: Negative for blurred vision and double vision.  Respiratory: Positive for cough and shortness of breath. Negative for hemoptysis, sputum production, wheezing and stridor.   Cardiovascular: Negative for chest pain, palpitations, orthopnea, leg swelling and PND.  Gastrointestinal: Negative for abdominal pain, constipation, diarrhea, heartburn, nausea and vomiting.  Genitourinary: Negative for dysuria, hematuria and urgency.  Musculoskeletal: Positive for myalgias. Negative for joint pain.  Skin: Negative for itching and rash.  Neurological: Negative for dizziness, tingling, weakness and headaches.  Endo/Heme/Allergies: Negative for environmental allergies. Does not bruise/bleed easily.  Psychiatric/Behavioral: Negative for depression. The patient is nervous/anxious. The patient  does not have insomnia.   All other systems reviewed and are negative.    Past Medical History  She,  has a past medical history of GERD (gastroesophageal reflux disease), Pneumonia, and Pulmonary fibrosis ([place]).   Surgical History    Past Surgical History:  Procedure Laterality Date  . ANKLE SURGERY Left    fx, has plates  . COLONOSCOPY    . ESOPHAGOGASTRODUODENOSCOPY    . HEMORRHOID SURGERY    . LUNG BIOPSY    . TUBAL LIGATION       Social History   reports that she quit smoking about 26 years ago. Her  smoking use included cigarettes. She started smoking about 52 years ago. She smoked 2.00 packs per day. She has never used smokeless tobacco. She reports current alcohol use. She reports that she does not use drugs.   Family History   Her family history includes Asthma in her brother; Cancer in her father; Diabetes in her mother.   Allergies No Known Allergies   Home Medications  Prior to Admission medications   Medication Sig Start Date End Date Taking? Authorizing Provider  acetaminophen (TYLENOL) 500 MG tablet Take 1,000 mg by mouth every 6 (six) hours as needed for moderate pain. Take 3 tablets twice daily    Yes [provider]  azaTHIOprine (IMURAN) 50 MG tablet TAKE 3 TABLETS EVERY DAY Patient taking differently: Take 50 mg by mouth 3 (three) times daily.  07/12/19  Yes Mannam, Praveen, MD  chlorpheniramine-HYDROcodone (TUSSIONEX) 10-8 MG/5ML SUER Take 5 mLs by mouth every 12 (twelve) hours as needed for cough. 07/27/19  Yes Candee Furbish, MD  dextromethorphan-guaiFENesin [facility] DM) 30-600 MG 12hr tablet Take 1 tablet by mouth 2 (two) times daily as needed for cough. 07/25/19  Yes Rizwan, Eunice Blase, MD  ESBRIET 267 MG TABS TAKE 3 TABLETS (801MG) BY MOUTH THREE TIMES A DAY Patient taking differently: Take 801 mg by mouth 3 (three) times daily.  07/22/19  Yes Mannam, Praveen, MD  Multiple Vitamins-Minerals (CENTRUM SILVER) tablet Take 1 tablet by mouth daily.    Yes [provider]  omeprazole (PRILOSEC) 20 MG capsule Take 1 capsule (20 mg total) by mouth daily. 12/28/18  Yes Marrian Salvage, [name]  OXYGEN Inhale into the lungs. 3 liters per minute, uses when up and moving around   Yes [provider]  predniSONE (DELTASONE) 5 MG tablet Take 5 mg by mouth daily with breakfast.   Yes [provider]  simvastatin (ZOCOR) 20 MG tablet TAKE 1 TABLET DAILY AT 6PM (NO FURTHER REFILLS WITHOUT OFFICE VISIT) Patient taking differently: Take 20 mg by mouth daily  at 6 PM.  07/08/19  Yes Biagio Borg, MD  sulfamethoxazole-trimethoprim (BACTRIM DS) 800-160 MG tablet Take 1 tablet by mouth 3 (three) times a week. TAKE 1 TABLET EVERY DAY ON MONDAY, [place] Patient not taking: Reported on 08/21/2019 01/22/19   Marshell Garfinkel, MD  traZODone (DESYREL) 100 MG tablet TAKE 1 TABLET (100 MG TOTAL) BY MOUTH AT BEDTIME AS NEEDED FOR SLEEP (NO FURTHER REFILLS WITHOUT OFFICE VISIT) Patient not taking: Reported on 08/21/2019 07/08/19   Biagio Borg, MD     Garner Nash, DO [place] [facility] 09/05/2019 10:36 AM

## 2019-09-05 NOTE — Progress Notes (Signed)
Stacy [name]  SMM:[id number] DOB: [birth year] DOA: 08/21/2019 PCP: Marrian Salvage, [name]    Brief Narrative:  64 year old with a history of 4 L O2 dependent pulmonary fibrosis (on imuran and pirfenidone chronically) who presented to the ED 2/13 with shortness of breath and a known Covid infection diagnosed 2/11.  Since admission she has been treated with remdesivir, steroids, and 2 units of convalescent plasma.  She has been diagnosed with extensive acute right lower extremity DVTs during his hospital stay.  Significant Events: 2/11 Covid test positive 2/13 admit to [facility] via [facility] ED  COVID-19 specific Treatment: Remdesivir 2/13 > 2/17 Decadron 2/13 > 2/23 Convalescent plasma 2/15  Antimicrobials:  None  Subjective: In good spirits today. Appears much more relaxed. Desires continued aggressive care / not yet ready for hospice level care at home. Understands that further improvement will be very prolonged and require ongoing hospital stay. No cp, n/v, or abdom pain. Reports she feels less SOB today. Pulm to see today.   Assessment & Plan:  COVID Pneumonia -acute on chronic hypoxic respiratory failure CTa negative for pulmonary embolism - has completed remdesivir and decadron treatment courses - was dosed with convalescent plasma -slow to improve and still requires significantly more oxygen support than her baseline - continue to slowly wean O2 as able, though progress is very slow - will require prolonged hosptial stay for continued recovery   Chronic hypoxic respiratory failure -pulmonary fibrosis Chronically on pirfenidone - Imuran on hold during hospital stay - given very slow progress, with intermittent setbacks, I asked her Pumonary Team to weigh in and see if they have any suggestions regarding tx - added low dose klonopin during this admission as anxiety has been worsening her resp mechanics   Acute extensive right lower extremity DVT Peroneal, gastrocnemius, and  popliteal veins - on Eliqus   GERD  Mild hypokalemia Corrected to target of 4.0 with supplementation  DVT prophylaxis: Therapeutic Lovenox > Eliquis Code Status: NO CODE BLUE - DNR  Family Communication:  Disposition Plan: The patient lives alone - still requiring high level O2 support - will require transfer to [facility] to allow continued weaning of O2 support before d/c   Consultants:  Palliative Care   Objective: Blood pressure 101/75, pulse 82, temperature 97.7 F (36.5 C), temperature source Oral, resp. rate 20, height 5' 6" (1.676 m), weight 71.9 kg, SpO2 95 %.  Intake/Output Summary (Last 24 hours) at 09/05/2019 0847 Last data filed at 09/04/2019 1800 Gross per 24 hour  Intake 240 ml  Output 1000 ml  Net -760 ml   Filed Weights   08/21/19 1232 08/21/19 2217  Weight: 68 kg 71.9 kg    Examination: General: alert and calm  Lungs: Fine diffuse crackles - no wheezing   Cardiovascular: RRR w/o rub  Abdomen: NT/ND, soft, BS positive, no rebound Extremities: No edema B LE  CBC: Recent Labs  Lab 08/30/19 0110 09/02/19 0238  WBC 12.1* 12.7*  HGB 12.9 12.6  HCT 40.5 42.1  MCV 73.6* 76.4*  PLT 497* 307*   Basic Metabolic Panel: Recent Labs  Lab 08/30/19 0110 09/01/19 0250 09/02/19 0238  NA 140 141 142  K 3.5 3.4* 4.0  CL 96* 96* 98  CO2 32 32 33*  GLUCOSE 110* 83 88  BUN _0 CREATININE 0.58 0.60 0.76  CALCIUM 9.4 9.2 9.1  MG 2.1  --   --    GFR: Estimated Creatinine Clearance: 71.2 mL/min (by C-G formula based on  SCr of 0.76 mg/dL).  Liver Function Tests: No results for input(s): AST, ALT, ALKPHOS, BILITOT, PROT, ALBUMIN in the last 168 hours.  Scheduled Meds: . apixaban  10 mg Oral BID   Followed by  . [START ON 09/09/2019] apixaban  5 mg Oral BID  . vitamin C  500 mg Oral Daily  . budesonide  1 puff Inhalation BID  . clonazePAM  0.25 mg Oral BID  . Ipratropium-Albuterol  1 puff Inhalation Q6H  . multivitamin with minerals  1 tablet Oral Daily    . pantoprazole  40 mg Oral Daily  . Pirfenidone  801 mg Oral TID  . predniSONE  5 mg Oral Q breakfast  . zinc sulfate  220 mg Oral Daily     LOS: 15 days   Cherene Altes, MD [facility] Office  [phone] Pager - Text Page per Amion  If 7PM-7AM, please contact night-coverage per Amion 09/05/2019, 8:47 AM

## 2019-09-06 LAB — CBC
HCT: 39.7 % (ref 36.0–46.0)
Hemoglobin: 12.4 g/dL (ref 12.0–15.0)
MCH: 23.4 pg — ABNORMAL LOW (ref 26.0–34.0)
MCHC: 31.2 g/dL (ref 30.0–36.0)
MCV: 74.8 fL — ABNORMAL LOW (ref 80.0–100.0)
Platelets: 392 10*3/uL (ref 150–400)
RBC: 5.31 MIL/uL — ABNORMAL HIGH (ref 3.87–5.11)
RDW: 18.3 % — ABNORMAL HIGH (ref 11.5–15.5)
WBC: 14.8 10*3/uL — ABNORMAL HIGH (ref 4.0–10.5)
nRBC: 0 % (ref 0.0–0.2)

## 2019-09-06 LAB — COMPREHENSIVE METABOLIC PANEL
ALT: 16 U/L (ref 0–44)
AST: 25 U/L (ref 15–41)
Albumin: 3.4 g/dL — ABNORMAL LOW (ref 3.5–5.0)
Alkaline Phosphatase: 79 U/L (ref 38–126)
Anion gap: 11 (ref 5–15)
BUN: 10 mg/dL (ref 8–23)
CO2: 34 mmol/L — ABNORMAL HIGH (ref 22–32)
Calcium: 9.2 mg/dL (ref 8.9–10.3)
Chloride: 93 mmol/L — ABNORMAL LOW (ref 98–111)
Creatinine, Ser: 0.66 mg/dL (ref 0.44–1.00)
GFR calc Af Amer: >60 mL/min (ref >60)
GFR calc non Af Amer: >60 mL/min (ref >60)
Glucose, Bld: 93 mg/dL (ref 70–99)
Potassium: 3.4 mmol/L — ABNORMAL LOW (ref 3.5–5.1)
Sodium: 138 mmol/L (ref 135–145)
Total Bilirubin: 0.7 mg/dL (ref 0.3–1.2)
Total Protein: 6.9 g/dL (ref 6.5–8.1)

## 2019-09-06 LAB — D-DIMER, QUANTITATIVE: D-Dimer, Quant: 0.99 ug/mL-FEU — ABNORMAL HIGH (ref 0.00–0.50)

## 2019-09-06 LAB — MAGNESIUM: Magnesium: 2.1 mg/dL (ref 1.7–2.4)

## 2019-09-06 MED ORDER — CLONAZEPAM 0.25 MG PO TBDP
0.2500 mg | ORAL_TABLET | Freq: Two times a day (BID) | ORAL | Status: DC
  Administered 2019-09-06 – 2019-09-07 (×2): 0.25 mg via ORAL
  Filled 2019-09-06 (×2): qty 1

## 2019-09-06 MED ORDER — POTASSIUM CHLORIDE CRYS ER 20 MEQ PO TBCR
40.0000 meq | EXTENDED_RELEASE_TABLET | Freq: Two times a day (BID) | ORAL | Status: AC
  Administered 2019-09-06 (×2): 40 meq via ORAL
  Filled 2019-09-06 (×2): qty 2

## 2019-09-06 NOTE — Progress Notes (Signed)
Patient de-sat while eating, transitions to non-rebreather and stats return to 90's. Patient encouraged to take her time,  take a bit and replace the mask while she chews.

## 2019-09-06 NOTE — Progress Notes (Signed)
Notified family of progress all questions answered and this nurse's number shared for further communication.

## 2019-09-06 NOTE — Progress Notes (Signed)
Patient home meds returned from pharmacy.

## 2019-09-06 NOTE — Progress Notes (Signed)
Stacy [name]  [id number]:[id number] DOB: [birth year] DOA: 08/21/2019 PCP: Marrian Salvage, [name]    Brief Narrative:  64 year old with a history of 4 L O2 dependent pulmonary fibrosis (on imuran and pirfenidone chronically) who presented to the ED 2/13 with shortness of breath and a known Covid infection diagnosed 2/11.  Since admission she has been treated with remdesivir, steroids, and 2 units of convalescent plasma.  She has been diagnosed with extensive acute right lower extremity DVTs during his hospital stay.  Significant Events: 2/11 Covid test positive 2/13 admit to [facility] via [facility] ED 2/28 PCCM consult 3/1 transfer to [facility] 5W  COVID-19 specific Treatment: Remdesivir 2/13 > 2/17 Decadron 2/13 > 2/23 Convalescent plasma 2/15  Antimicrobials:  None  Subjective: Making progress in weaning of O2 toward home regimen when resting in bed, but continues to rapidly and severely desaturate with any attempted movement. Denies cp, n/v, or abdom pain.   Desires continued aggressive care / not yet ready for hospice level care at home. Understands that further improvement will be very prolonged and require ongoing hospital stay.   Assessment & Plan:  COVID Pneumonia - Acute on chronic hypoxic respiratory failure CTa negative for pulmonary embolism - has completed remdesivir and decadron treatment courses - was dosed with convalescent plasma - slow to improve and still requires significantly more oxygen support than her baseline - continue to slowly wean O2 as able, though progress is very slow - will require prolonged hosptial stay for continued recovery   Chronic hypoxic respiratory failure - pulmonary fibrosis Chronically on pirfenidone - Imuran has been on hold during hospital stay - given very slow progress, with intermittent setbacks, I asked her Pumonary Team to weigh in - added low dose klonopin and prn morphine during this admission as anxiety has been worsening her resp  mechanics - as per Pulmonary there is no role for ongoing Imuran tx or prednisone (pt informed) - consider intermittent diuresis as appropriate   Acute extensive right lower extremity DVT Peroneal, gastrocnemius, and popliteal veins - on Eliquis and tolerating well   Mild hypokalemia  Supplement po - due to poor intake and intermittent use of diuretic - Mg is normal   GERD Cont PPI   Mild hypokalemia Corrected to target of 4.0 with supplementation  DVT prophylaxis: Therapeutic Lovenox > Eliquis Code Status: NO CODE BLUE - DNR  Family Communication:  Disposition Plan: The patient lives alone - still requiring high level O2 support - will require transfer to [facility] to allow continued weaning of O2 support before d/c   Consultants:  Palliative Care   Objective: Blood pressure 109/78, pulse 76, temperature (!) 97.5 F (36.4 C), temperature source Oral, resp. rate 18, height _0  (1.676 m), weight 71.9 kg, SpO2 93 %. No intake or output data in the 24 hours ending 09/06/19 0744 Filed Weights   08/21/19 1232 08/21/19 2217  Weight: 68 kg 71.9 kg    Examination: General: alert and calm - no acute distress at rest  Lungs: fine diffuse crackles - no wheezing   Cardiovascular: RRR - no rub or M  Abdomen: NT/ND, soft, BS positive, no rebound Extremities: No edema B lower extremities   CBC: Recent Labs  Lab 09/02/19 0238 09/06/19 0406  WBC 12.7* 14.8*  HGB 12.6 12.4  HCT 42.1 39.7  MCV 76.4* 74.8*  PLT 433* 888   Basic Metabolic Panel: Recent Labs  Lab 09/01/19 0250 09/02/19 0238 09/06/19 0406  NA 141 142 138  K  3.4* 4.0 3.4*  CL 96* 98 93*  CO2 32 33* 34*  GLUCOSE 83 88 93  BUN _0 CREATININE 0.60 0.76 0.66  CALCIUM 9.2 9.1 9.2  MG  --   --  2.1   GFR: Estimated Creatinine Clearance: 71.2 mL/min (by C-G formula based on SCr of 0.66 mg/dL).  Liver Function Tests: Recent Labs  Lab 09/06/19 0406  AST 25  ALT 16  ALKPHOS 79  BILITOT 0.7  PROT 6.9   ALBUMIN 3.4*    Scheduled Meds: . apixaban  10 mg Oral BID   Followed by  . [START ON 09/09/2019] apixaban  5 mg Oral BID  . vitamin C  500 mg Oral Daily  . budesonide  1 puff Inhalation BID  . clonazePAM  0.25 mg Oral BID  . Ipratropium-Albuterol  1 puff Inhalation Q6H  . multivitamin with minerals  1 tablet Oral Daily  . pantoprazole  40 mg Oral Daily  . Pirfenidone  801 mg Oral TID  . zinc sulfate  220 mg Oral Daily     LOS: 16 days   Cherene Altes, MD [facility] Office  [phone] Pager - Text Page per Amion  If 7PM-7AM, please contact night-coverage per Amion 09/06/2019, 7:44 AM

## 2019-09-07 ENCOUNTER — Inpatient Hospital Stay (HOSPITAL_COMMUNITY): Payer: Medicare HMO

## 2019-09-07 LAB — CBC WITH DIFFERENTIAL/PLATELET
Abs Immature Granulocytes: 0.15 10*3/uL — ABNORMAL HIGH (ref 0.00–0.07)
Basophils Absolute: 0.1 10*3/uL (ref 0.0–0.1)
Basophils Relative: 1 %
Eosinophils Absolute: 0.2 10*3/uL (ref 0.0–0.5)
Eosinophils Relative: 2 %
HCT: 38.4 % (ref 36.0–46.0)
Hemoglobin: 11.7 g/dL — ABNORMAL LOW (ref 12.0–15.0)
Immature Granulocytes: 2 %
Lymphocytes Relative: 18 %
Lymphs Abs: 1.6 10*3/uL (ref 0.7–4.0)
MCH: 23.4 pg — ABNORMAL LOW (ref 26.0–34.0)
MCHC: 30.5 g/dL (ref 30.0–36.0)
MCV: 76.6 fL — ABNORMAL LOW (ref 80.0–100.0)
Monocytes Absolute: 0.9 10*3/uL (ref 0.1–1.0)
Monocytes Relative: 9 %
Neutro Abs: 6.4 10*3/uL (ref 1.7–7.7)
Neutrophils Relative %: 68 %
Platelets: 330 10*3/uL (ref 150–400)
RBC: 5.01 MIL/uL (ref 3.87–5.11)
RDW: 18.5 % — ABNORMAL HIGH (ref 11.5–15.5)
WBC: 9.4 10*3/uL (ref 4.0–10.5)
nRBC: 0 % (ref 0.0–0.2)

## 2019-09-07 LAB — COMPREHENSIVE METABOLIC PANEL
ALT: 17 U/L (ref 0–44)
AST: 26 U/L (ref 15–41)
Albumin: 2.9 g/dL — ABNORMAL LOW (ref 3.5–5.0)
Alkaline Phosphatase: 81 U/L (ref 38–126)
Anion gap: 10 (ref 5–15)
BUN: 9 mg/dL (ref 8–23)
CO2: 30 mmol/L (ref 22–32)
Calcium: 8.9 mg/dL (ref 8.9–10.3)
Chloride: 98 mmol/L (ref 98–111)
Creatinine, Ser: 0.75 mg/dL (ref 0.44–1.00)
GFR calc Af Amer: >60 mL/min (ref >60)
GFR calc non Af Amer: >60 mL/min (ref >60)
Glucose, Bld: 87 mg/dL (ref 70–99)
Potassium: 4.2 mmol/L (ref 3.5–5.1)
Sodium: 138 mmol/L (ref 135–145)
Total Bilirubin: 0.5 mg/dL (ref 0.3–1.2)
Total Protein: 6.4 g/dL — ABNORMAL LOW (ref 6.5–8.1)

## 2019-09-07 LAB — D-DIMER, QUANTITATIVE: D-Dimer, Quant: 0.92 ug/mL-FEU — ABNORMAL HIGH (ref 0.00–0.50)

## 2019-09-07 LAB — C-REACTIVE PROTEIN: CRP: 1.6 mg/dL — ABNORMAL HIGH (ref <1.0)

## 2019-09-07 LAB — BRAIN NATRIURETIC PEPTIDE: B Natriuretic Peptide: 30 pg/mL (ref 0.0–100.0)

## 2019-09-07 LAB — MAGNESIUM: Magnesium: 2.1 mg/dL (ref 1.7–2.4)

## 2019-09-07 LAB — PROCALCITONIN: Procalcitonin: <0.1 ng/mL

## 2019-09-07 MED ORDER — PAROXETINE HCL 10 MG PO TABS
10.0000 mg | ORAL_TABLET | Freq: Every day | ORAL | Status: DC
  Administered 2019-09-07 – 2019-09-10 (×4): 10 mg via ORAL
  Filled 2019-09-07 (×4): qty 1

## 2019-09-07 MED ORDER — CLONAZEPAM 0.25 MG PO TBDP
0.5000 mg | ORAL_TABLET | Freq: Two times a day (BID) | ORAL | Status: DC
  Administered 2019-09-07 – 2019-09-10 (×6): 0.5 mg via ORAL
  Filled 2019-09-07 (×6): qty 2

## 2019-09-07 NOTE — Plan of Care (Signed)
  Problem: Education: Goal: Knowledge of risk factors and measures for prevention of condition will improve Outcome: Progressing   Problem: Coping: Goal: Psychosocial and spiritual needs will be supported Outcome: Progressing   Problem: Respiratory: Goal: Will maintain a patent airway Outcome: Progressing Goal: Complications related to the disease process, condition or treatment will be avoided or minimized Outcome: Progressing   Problem: Clinical Measurements: Goal: Will remain free from infection Outcome: Progressing   Problem: Activity: Goal: Risk for activity intolerance will decrease Outcome: Progressing   Problem: Safety: Goal: Ability to remain free from injury will improve Outcome: Progressing   Problem: Skin Integrity: Goal: Risk for impaired skin integrity will decrease Outcome: Progressing

## 2019-09-07 NOTE — Progress Notes (Signed)
Occupational Therapy Evaluation Patient Details Name: Stacy [name] MRN: [id number] DOB: 06/06/1956 Today's Date: 09/07/2019    History of Present Illness 64 year old with history of GERD, pulmonary fibrosis with chronic hypoxia on 4 L nasal cannula at home on Imuran and pirfenidone presented to the ED with shortness of breath on 2/11 found to have COVID-19 pneumonia.  CTA chest showed pulmonary fibrosis with reactive lymphadenopathy but no evidence of pulmonary embolism.  Completed course of remdesivir 2/17 and received convalescent plasma 2/15.  Not a candidate for Actemra as patient was on Imuran as outpatient.   Clinical Impression   PTA, pt lived at home alone and was Independent with ADLs and mobility without AD. OT re-evaluation completed after pt transferred to [facility] from [facility]. Pt received in recliner chair on 13 L HFNC. Presently, pt with some anxiety regarding therapy expectations and DC plans. Time spent educating pt on OT POC and goals. Pt able to complete sit to stand without AD at Supervision level and stand for > 30 seconds, no dizziness reported but desats to mid-high 80s. Pt able to Independently reach to adjust socks seated in recliner chair. Guided pt in simple grooming tasks sitting in recliner, in which pt removed HFNC to wash face, SpO2 dropped to 81% at this time. Reinforced pursed lip breathing and rest breaks with pt increased to low 90s on 13 L HFNC. Pt unsure if she wants to continue current rehab course or go home with hospice. Pt would benefit from OT services, as long as she is willing to participate. If 24 hour assistance/supervision available at home, recommend [place]. Will continue to follow acutely.     Follow Up Recommendations  Home health OT;Supervision/Assistance - 24 hour;Other (comment)(HHOT if 24 superivison/assistance available at home)    Equipment Recommendations  None recommended by OT    Recommendations for Other Services       Precautions / Restrictions  Precautions Precautions: Fall;Other (comment) Precaution Comments: desat w/ min activity, highly anxious  Restrictions Weight Bearing Restrictions: No      Mobility Bed Mobility               General bed mobility comments: Up in chair  Transfers Overall transfer level: Needs assistance Equipment used: None Transfers: Sit to/from Stand Sit to Stand: Supervision         General transfer comment: Pt Supervision for sit to stand from recliner chair for placement of linens     Balance                                           ADL either performed or assessed with clinical judgement   ADL Overall ADL's : Needs assistance/impaired Eating/Feeding: Sitting;Independent   Grooming: Wash/dry hands;Wash/dry face;Applying deodorant;Set up;Sitting               Lower Body Dressing: Supervision/safety;Sitting/lateral leans Lower Body Dressing Details (indicate cue type and reason): Supervision for LB dressing to reach socks using figure 4 position. Suspect min guard to Min A for LB dressing over waist in standing               General ADL Comments: Pt physically and functionally capable of completing tasks with limited assist. However, due to rapidly dropping O2 stats, pt limited in independence     Vision         Perception     Praxis  Pertinent Vitals/Pain Pain Assessment: Faces Faces Pain Scale: Hurts little more Pain Location: Nasal cavities  Pain Descriptors / Indicators: Burning Pain Intervention(s): Other (comment)(Contacted RN for humidifier for O2 to ease burning)     Hand Dominance Right   Extremity/Trunk Assessment Upper Extremity Assessment Upper Extremity Assessment: Generalized weakness   Lower Extremity Assessment Lower Extremity Assessment: Defer to PT evaluation       Communication Communication Communication: No difficulties   Cognition Arousal/Alertness: Awake/alert Behavior During Therapy: WFL for tasks  assessed/performed;Anxious Overall Cognitive Status: Within Functional Limits for tasks assessed                                     General Comments  Pt on 13 L HFNC, dropping to 81% with [place] removed during grooming tasks and breathing break due to burning from [place].     Exercises Exercises: Other exercises Other Exercises Other Exercises: pursed lip breathing    Shoulder Instructions      Home Living Family/patient expects to be discharged to:: Private residence Living Arrangements: Alone Available Help at Discharge: Family Type of Home: Apartment Home Access: Level entry     Home Layout: One level     Bathroom Shower/Tub: Teacher, early years/pre: Standard     Home Equipment: Environmental consultant - 2 wheels;Tub bench          Prior Functioning/Environment Level of Independence: Independent                 OT Problem List: Decreased strength;Decreased activity tolerance;Decreased safety awareness;Impaired balance (sitting and/or standing);Cardiopulmonary status limiting activity      OT Treatment/Interventions: Self-care/ADL training;Therapeutic exercise;Energy conservation;Therapeutic activities    OT Goals(Current goals can be found in the care plan section) Acute Rehab OT Goals Patient Stated Goal: go home OT Goal Formulation: With patient Time For Goal Achievement: 09/21/19 Potential to Achieve Goals: Good  OT Frequency: Min 3X/week   Barriers to D/C: Decreased caregiver support          Co-evaluation              AM-PAC OT "6 Clicks" Daily Activity     Outcome Measure Help from another person eating meals?: None Help from another person taking care of personal grooming?: A Little Help from another person toileting, which includes using toliet, bedpan, or urinal?: A Little Help from another person bathing (including washing, rinsing, drying)?: A Little Help from another person to put on and taking off regular upper body  clothing?: A Little Help from another person to put on and taking off regular lower body clothing?: A Little 6 Click Score: 19   End of Session Equipment Utilized During Treatment: Oxygen Nurse Communication: Other (comment)(O2 stats, need for humidifier to decrease [place] discomfort)  Activity Tolerance: Patient tolerated treatment well;Other (comment)(Limited by anxiety and desats) Patient left: in chair;with call bell/phone within reach  OT Visit Diagnosis: Unsteadiness on feet (R26.81);Muscle weakness (generalized) (M62.81)                Time: 8329-1916 OT Time Calculation (min): 29 min Charges:  OT General Charges $OT Visit: 1 Visit OT Evaluation $OT Re-eval: 1 Re-eval OT Treatments $Self Care/Home Management : 8-22 mins  Layla Maw, OTR/L  Layla Maw 09/07/2019, 1:29 PM

## 2019-09-07 NOTE — TOC Benefit Eligibility Note (Signed)
Transition of Care [facility]) Benefit Eligibility Note    Patient Details  Name: Stacy [name] MRN: [id number] Date of Birth: 14-Feb-1956   Medication/Dose: Eliquis 49m  Covered?: Yes  Tier: 3 Drug  Prescription Coverage Preferred Pharmacy: Any retail Pharmacy  Spoke with Person/Company/Phone Number:: JTyrone [phone]/[employer]  Co-Pay: No Copay for a 30 or 90 day supply  Prior Approval: No  Deductible: Met       IOrbie PyoPhone Number: 09/07/2019, 11:43 AM

## 2019-09-07 NOTE — Progress Notes (Signed)
PROGRESS NOTE                                                                                                                                                                                                             Patient Demographics:    Stacy [name], is a 64 y.o. female, DOB - 01-11-1956, RNH:[id number]  Outpatient Primary MD for the patient is Marrian Salvage, [name]    LOS - 54  Admit date - 08/21/2019    CC - SOB     Brief Narrative -   65 year old with a history of 4-6 L O2 dependent pulmonary fibrosis (on imuran and pirfenidone chronically) who presented to the ED 2/13 with shortness of breath and a known Covid infection diagnosed 2/11.  Since admission she has been treated with remdesivir, steroids, and 2 units of convalescent plasma.  She has been diagnosed with extensive acute right lower extremity DVTs during his hospital stay.  Significant Events: 2/11 Covid test positive 2/13 admit to [facility] via [facility] ED 2/28 PCCM consult 3/1 transfer to [facility]  COVID-19 specific Treatment: Remdesivir 2/13 > 2/17 Decadron 2/13 > 2/23 Convalescent plasma 2/15   Subjective:    Stacy [name] today has, No headache, No chest pain, No abdominal pain - No Nausea, No new weakness tingling or numbness, +ve SOB.   Assessment  & Plan :     1. Acute Hypoxic Resp. Failure due to Acute Covid 19 Viral Pneumonitis during the ongoing 2020 Covid 19 Pandemic - she had severe disease on top of her severe underlying ILD, of note she uses 4 to 6 L nasal cannula oxygen at baseline.  For COVID-19 pneumonia she was treated with IV steroids, remdesivir, convalescent plasma.  She is now showing gradual recovery.  Continue supportive care.  Continue to gradually titrate down oxygen as tolerated, I think she might stabilize around 8 to 10 L of nasal cannula oxygen soon.  Encouraged the patient to sit up in chair in the daytime  use I-S and flutter valve for pulmonary toiletry and then prone in bed when at night.     SpO2: 96 % O2 Flow Rate (L/min): 8 L/min FiO2 (%): 100 %  Recent Labs  Lab 09/01/19 0250 09/06/19 0406 09/07/19 0550  CRP 1.1*  --  1.6*  DDIMER  --  0.99* 0.92*  PROCALCITON <0.10  --   --     2.  ILD.  Seen by pulmonary this admission, she is on 4 to 6 L oxygen at home at baseline, her home medication pirfenidone is continued.  Post discharge will follow with her pulmonologist Dr. Vaughan Browner.  3.  Acute right lower extremity DVT.  On Eliquis.  4.  GERD.  PPI.  5.  Severe anxiety.  Currently on Klonopin, will increase the dose and add low-dose Paxil as well.    Condition - Extremely Guarded  Family Communication  : Son updated on 09/07/2019.  Code Status :  Full  Diet :   Diet Order            Diet regular Room service appropriate? Yes; Fluid consistency: Thin  Diet effective now               Disposition Plan  : Stay in PCU still on 8 to 10 L nasal cannula oxygen along with as needed nonrebreather mask on top of it.  Consults  : Seen by pulmonary and palliative care this admission.  Procedures  :    CTA.  No PE.  Lower extremity venous duplex.  Acute right leg DVT.  PUD Prophylaxis : PPI  DVT Prophylaxis  :  Eliquis  Lab Results  Component Value Date   PLT 330 09/07/2019    Inpatient Medications  Scheduled Meds: . apixaban  10 mg Oral BID   Followed by  . [START ON 09/09/2019] apixaban  5 mg Oral BID  . budesonide  1 puff Inhalation BID  . clonazepam  0.25 mg Oral BID  . Ipratropium-Albuterol  1 puff Inhalation Q6H  . multivitamin with minerals  1 tablet Oral Daily  . pantoprazole  40 mg Oral Daily  . Pirfenidone  801 mg Oral TID   Continuous Infusions: PRN Meds:.acetaminophen, bisacodyl, chlorpheniramine-HYDROcodone, dextromethorphan-guaiFENesin, morphine, ondansetron (ZOFRAN) IV, phenol-menthol, polyethylene glycol, sodium chloride, traMADol  Antibiotics   :    Anti-infectives (From admission, onward)   Start     Dose/Rate Route Frequency Ordered Stop   08/22/19 1000  remdesivir 100 mg in sodium chloride 0.9 % 100 mL IVPB     100 mg 200 mL/hr over 30 Minutes Intravenous Daily 08/21/19 1314 08/25/19 1206   08/21/19 1400  remdesivir 200 mg in sodium chloride 0.9% 250 mL IVPB     200 mg 580 mL/hr over 30 Minutes Intravenous Once 08/21/19 1314 08/21/19 1437       Time Spent in minutes  30   Lala Lund M.D on 09/07/2019 at 10:45 AM  To page go to [email or web address] - password [facility]  [facility] -  Office  [phone]   See all Orders from today for further details    Objective:   Vitals:   09/06/19 1600 09/06/19 2000 09/06/19 2200 09/07/19 0657  BP: 106/90 97/75  93/61  Pulse: 95 84 98 80  Resp: (!) 22 (!) 28 20   Temp: 98.4 F (36.9 C) 98.6 F (37 C)  97.6 F (36.4 C)  TempSrc: Oral Oral  Axillary  SpO2: 92% 97% 90% 96%  Weight:      Height:        Wt Readings from Last 3 Encounters:  08/21/19 71.9 kg  08/19/19 68 kg  07/27/19 67.4 kg     Intake/Output Summary (Last 24 hours) at 09/07/2019 1045 Last data filed at 09/07/2019 0700 Gross per 24 hour  Intake 240 ml  Output 200 ml  Net  40 ml     Physical Exam  Awake Alert, No new F.N deficits, Normal affect [place].AT,PERRAL Supple Neck,No JVD, No cervical lymphadenopathy appriciated.  Symmetrical Chest wall movement, Good air movement bilaterally, few rales RRR,No Gallops,Rubs or new Murmurs, No Parasternal Heave +ve B.Sounds, Abd Soft, No tenderness, No organomegaly appriciated, No rebound - guarding or rigidity. No Cyanosis, Clubbing or edema, No new Rash or bruise     Data Review:    CBC Recent Labs  Lab 09/02/19 0238 09/06/19 0406 09/07/19 0550  WBC 12.7* 14.8* 9.4  HGB 12.6 12.4 11.7*  HCT 42.1 39.7 38.4  PLT 433* 392 330  MCV 76.4* 74.8* 76.6*  MCH 22.9* 23.4* 23.4*  MCHC 29.9* 31.2 30.5  RDW 18.6* 18.3* 18.5*  LYMPHSABS  --   --  1.6    MONOABS  --   --  0.9  EOSABS  --   --  0.2  BASOSABS  --   --  0.1    Chemistries  Recent Labs  Lab 09/01/19 0250 09/02/19 0238 09/06/19 0406 09/07/19 0550  NA 141 142 138 138  K 3.4* 4.0 3.4* 4.2  CL 96* 98 93* 98  CO2 32 33* 34* 30  GLUCOSE 83 88 93 87  BUN _0 CREATININE 0.60 0.76 0.66 0.75  CALCIUM 9.2 9.1 9.2 8.9  MG  --   --  2.1 2.1  AST  --   --  25 26  ALT  --   --  16 17  ALKPHOS  --   --  79 81  BILITOT  --   --  0.7 0.5   ------------------------------------------------------------------------------------------------------------------ No results for input(s): CHOL, HDL, LDLCALC, TRIG, CHOLHDL, LDLDIRECT in the last 72 hours.  No results found for: HGBA1C ------------------------------------------------------------------------------------------------------------------ No results for input(s): TSH, T4TOTAL, T3FREE, THYROIDAB in the last 72 hours.  Invalid input(s): FREET3  Cardiac Enzymes No results for input(s): CKMB, TROPONINI, MYOGLOBIN in the last 168 hours.  Invalid input(s): CK ------------------------------------------------------------------------------------------------------------------    Component Value Date/Time   BNP 45.3 08/26/2019 0155    Micro Results No results found for this or any previous visit (from the past 240 hour(s)).  Radiology Reports DG Chest 1 View  Result Date: 08/29/2019 CLINICAL DATA:  Pulmonary fibrosis EXAM: CHEST  1 VIEW COMPARISON:  08/21/2019, 08/22/2019 FINDINGS: Single frontal view of the chest again demonstrates basilar predominant pulmonary fibrosis. No new airspace disease, effusion, or pneumothorax. The cardiac silhouette is unremarkable. IMPRESSION: 1. Continued bibasilar pulmonary fibrosis. No acute airspace disease. Electronically Signed   By: Randa Ngo M.D.   On: 08/29/2019 21:10   CT ANGIO CHEST PE W OR WO CONTRAST  Result Date: 08/22/2019 CLINICAL DATA:  Shortness of breath, history of  pulmonary fibrosis EXAM: CT ANGIOGRAPHY CHEST WITH CONTRAST TECHNIQUE: Multidetector CT imaging of the chest was performed using the standard protocol during bolus administration of intravenous contrast. Multiplanar CT image reconstructions and MIPs were obtained to evaluate the vascular anatomy. CONTRAST:  54m OMNIPAQUE IOHEXOL 350 MG/ML SOLN COMPARISON:  07/20/2019 FINDINGS: Cardiovascular: Satisfactory opacification the bilateral pulmonary arteries to the segmental level. No evidence of pulmonary embolism. Enlargement of the pulmonary arteries, suggesting pulmonary arterial hypertension. No evidence of thoracic aortic aneurysm or dissection. Atherosclerotic calcifications of the aortic arch. The heart is top-normal in size.  No pericardial effusion. Mediastinum/Nodes: Small mediastinal lymph nodes, including a dominant 12 mm short axis subcarinal node (series 5/image 77, previously 11 mm. Bilateral hilar/perihilar lymphadenopathy, measuring up to 13  mm short axis on the right, previously 12 mm. This appearance is favored to be reactive. Visualized right thyroid is mildly enlarged/nodular. Lungs/Pleura: Residual post infectious/inflammatory scarring in the left upper lobe (series 7/image 38). Multifocal patchy/ground-glass opacities with subpleural reticulation in the lungs bilaterally, lower lobe predominant, compatible with pulmonary fibrosis. Trace loculated pleural fluid along the left major fissure, minimally increased from the prior. No suspicious pulmonary nodules. No pneumothorax. Upper Abdomen: Visualized upper abdomen is notable for a dominant anterior right upper pole renal cyst measuring at least 6.5 cm, incompletely visualized. Musculoskeletal: Visualized osseous structures are within normal limits. Review of the MIP images confirms the above findings. IMPRESSION: No evidence of pulmonary embolism. Pulmonary fibrosis, unchanged.  Suspected reactive lymphadenopathy. Prior left upper lobe pneumonia has  resolved, with mild residual post infectious/inflammatory scarring. Suspected pulmonary arterial hypertension. Aortic Atherosclerosis (ICD10-I70.0). Electronically Signed   By: Julian Hy M.D.   On: 08/22/2019 01:10   DG Chest Port 1 View  Result Date: 09/07/2019 CLINICAL DATA:  Respiratory distress, COVID positive EXAM: PORTABLE CHEST 1 VIEW COMPARISON:  08/29/2019 FINDINGS: Airspace disease bilaterally, unchanged. Heart is normal size. No effusions or pneumothorax. No acute bony abnormality. IMPRESSION: Stable bilateral airspace disease, likely related to pulmonary fibrosis. No change. Electronically Signed   By: Rolm Baptise M.D.   On: 09/07/2019 08:49   DG Chest Port 1 View  Result Date: 08/21/2019 CLINICAL DATA:  Hypoxia, COVID-19. EXAM: PORTABLE CHEST 1 VIEW COMPARISON:  Chest x-rays dated 08/19/2019 and 07/20/2019 FINDINGS: Interstitial lung opacities are again seen bilaterally, RIGHT slightly greater than LEFT, most likely related to chronic interstitial lung disease/fibrosis. The LEFT upper lobe is now clear, compatible with resolution of the previously demonstrated LEFT upper lobe pneumonia. No evidence of a new pneumonia. No pleural effusion or pneumothorax is seen. Stable cardiomegaly. IMPRESSION: 1. Resolution of the previous LEFT upper lobe pneumonia. No new lung findings. No evidence of a new pneumonia. 2. Stable interstitial lung opacities bilaterally, most likely chronic interstitial lung disease/fibrosis. 3. Stable cardiomegaly. Electronically Signed   By: Franki Cabot M.D.   On: 08/21/2019 13:22   DG Chest Port 1 View  Result Date: 08/19/2019 CLINICAL DATA:  Shortness of breath. EXAM: PORTABLE CHEST 1 VIEW COMPARISON:  July 20, 2019. FINDINGS: Stable cardiomegaly. No pneumothorax or pleural effusion is noted. Stable bilateral interstitial lung opacities are noted most consistent with pulmonary fibrosis or scarring. Left upper lobe pneumonia on prior exam has resolved. Bony  thorax is unremarkable. No definite acute abnormality is noted. IMPRESSION: Stable bilateral interstitial lung opacities are noted most consistent with pulmonary fibrosis or scarring. Electronically Signed   By: Marijo Conception M.D.   On: 08/19/2019 14:42   VAS [place] LOWER EXTREMITY VENOUS (DVT)  Result Date: 08/27/2019  Lower Venous DVTStudy Indications: Swelling.  Risk Factors: COVID 19 positive. Comparison Study: No prior studies. Performing Technologist: Oliver Hum RVT  Examination Guidelines: A complete evaluation includes B-mode imaging, spectral Doppler, color Doppler, and power Doppler as needed of all accessible portions of each vessel. Bilateral testing is considered an integral part of a complete examination. Limited examinations for reoccurring indications may be performed as noted. The reflux portion of the exam is performed with the patient in reverse Trendelenburg.  +---------+---------------+---------+-----------+----------+--------------+ RIGHT    CompressibilityPhasicitySpontaneityPropertiesThrombus Aging +---------+---------------+---------+-----------+----------+--------------+ CFV      Full           Yes      Yes                                 +---------+---------------+---------+-----------+----------+--------------+  SFJ      Full                                                        +---------+---------------+---------+-----------+----------+--------------+ FV Prox  Full                                                        +---------+---------------+---------+-----------+----------+--------------+ FV Mid   Full                                                        +---------+---------------+---------+-----------+----------+--------------+ FV DistalFull                                                        +---------+---------------+---------+-----------+----------+--------------+ PFV      Full                                                         +---------+---------------+---------+-----------+----------+--------------+ POP      None           No       No                   Acute          +---------+---------------+---------+-----------+----------+--------------+ PTV      Full                                                        +---------+---------------+---------+-----------+----------+--------------+ PERO     Partial                                      Acute          +---------+---------------+---------+-----------+----------+--------------+ Gastroc  None                                         Acute          +---------+---------------+---------+-----------+----------+--------------+   +---------+---------------+---------+-----------+----------+--------------+ LEFT     CompressibilityPhasicitySpontaneityPropertiesThrombus Aging +---------+---------------+---------+-----------+----------+--------------+ CFV      Full           Yes      Yes                                 +---------+---------------+---------+-----------+----------+--------------+ SFJ  Full                                                        +---------+---------------+---------+-----------+----------+--------------+ FV Prox  Full                                                        +---------+---------------+---------+-----------+----------+--------------+ FV Mid   Full                                                        +---------+---------------+---------+-----------+----------+--------------+ FV DistalFull                                                        +---------+---------------+---------+-----------+----------+--------------+ PFV      Full                                                        +---------+---------------+---------+-----------+----------+--------------+ POP      Full           Yes      Yes                                  +---------+---------------+---------+-----------+----------+--------------+ PTV      Full                                                        +---------+---------------+---------+-----------+----------+--------------+ PERO     Full                                                        +---------+---------------+---------+-----------+----------+--------------+     Summary: RIGHT: - Findings consistent with acute deep vein thrombosis involving the right popliteal vein, right peroneal veins, and right gastrocnemius veins. - No cystic structure found in the popliteal fossa.  LEFT: - There is no evidence of deep vein thrombosis in the lower extremity.  - No cystic structure found in the popliteal fossa.  *See table(s) above for measurements and observations. Electronically signed by Deitra Mayo MD on 08/27/2019 at 1:57:20 PM.    Final

## 2019-09-07 NOTE — TOC Initial Note (Addendum)
Transition of Care [facility]) - Initial/Assessment Note    Patient Details  Name: Stacy [name] MRN: [id number] Date of Birth: Feb 27, 1956  [facility] CM/SW Contact:    Benard Halsted, LCSW Phone Number: 09/07/2019, 11:14 AM  Clinical Narrative:                 11am-CSW received consult for possible SNF placement at time of discharge. CSW spoke with patient regarding PT recommendation of SNF placement at time of discharge. Patient reported that she is worried that SNF will push her too hard. She requests CSW contact her daughter Stacy [name] to discuss options. Chris answered Carla's phone and stated that Stacy [name] was unavailable but will contact CSW back in about an hour. CSW to continue to follow and assist with discharge planning needs.  5pm- CSW contacted Stacy [name] again to discuss plan. She requests home health rather than SNF placement as she will be present to provide 24 hour supervision. Patient already has oxygen through [facility] and has a rolling walker with a seat. She does request a 3in1 bedside commode. 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     Expected Discharge Plan: Skilled Nursing Facility Barriers to Discharge: Continued Medical Work up   Patient Goals and CMS Choice Patient states their goals for this hospitalization and ongoing recovery are:: Feel better CMS Medicare.gov Compare Post Acute Care list provided to:: Patient Choice offered to / list presented to : Patient  Expected Discharge Plan and Services Expected Discharge Plan: [place] In-house Referral: Clinical Social Work     Living arrangements for the past 2 months: Single Family Home                                      Prior Living Arrangements/Services Living arrangements for the past 2 months: Single Family Home   Patient language and need for interpreter reviewed:: Yes Do you feel  safe going back to the place where you live?: Yes      Need for Family Participation in Patient Care: No (Comment) Care giver support system in place?: Yes (comment)   Criminal Activity/Legal Involvement Pertinent to Current Situation/Hospitalization: No - Comment as needed  Activities of Daily Living Home Assistive Devices/Equipment: Oxygen ADL Screening (condition at time of admission) Patient's cognitive ability adequate to safely complete daily activities?: Yes Is the patient deaf or have difficulty hearing?: No Does the patient have difficulty seeing, even when wearing glasses/contacts?: No Does the patient have difficulty concentrating, remembering, or making decisions?: No Patient able to express need for assistance with ADLs?: Yes Does the patient have difficulty dressing or bathing?: No Independently performs ADLs?: Yes (appropriate for developmental age) Does the patient have difficulty walking or climbing stairs?: No Weakness of Legs: None Weakness of Arms/Hands: None  Permission Sought/Granted Permission sought to share information with : Facility Sport and exercise psychologist, Family Supports Permission granted to share information with : Yes, Verbal Permission Granted  Share Information with NAME: Stacy [name]     Permission granted to share info w Relationship: Daughter  Permission granted to share info w Contact Information: [phone]  Emotional Assessment   Attitude/Demeanor/Rapport: Engaged Affect (typically observed): Accepting, Anxious, Appropriate Orientation: : Oriented to Self, Oriented to Place, Oriented to  Time, Oriented to Situation Alcohol / Substance Use: Not Applicable Psych Involvement: No (comment)  Admission diagnosis:  Hypokalemia [E87.6] Acute hypoxemic respiratory failure due to COVID-19 ([place]) [U07.1, J96.01] COVID-19 virus infection [U07.1] Patient Active Problem List   Diagnosis Date Noted  . Goals of care, counseling/discussion   . Encounter for  hospice care discussion   . Palliative care by specialist   . Pneumothorax, right 08/31/2019  . Acute respiratory failure with hypoxia ([place]) 08/22/2019  . Pneumonia due to COVID-19 virus 08/21/2019  . Acute on chronic respiratory failure with hypercapnia ([place]) 07/25/2019  . Community acquired pneumonia 07/20/2019  . Cough 12/16/2018  . Pulmonary fibrosis ([place]) 04/06/2018  . ILD (interstitial lung disease) ([place]) 04/06/2018  . Gastroesophageal reflux disease 04/06/2018  . Osteoporosis 04/06/2018   PCP:  Marrian Salvage, [place] Pharmacy:   [facility] [place], [address] [place] [address] Phone: [phone] Fax: [phone]  [facility] [place], [address] [place] [address] Phone: [phone] Fax: [phone]  [place], [place] [address] [place] [address] Phone: [phone] Fax: [phone]     Social Determinants of Health (SDOH) Interventions    Readmission Risk Interventions Readmission Risk Prevention Plan 07/21/2019  Post Dischage Appt Complete  Medication Screening Complete  Transportation Screening Complete  Some recent data might be hidden

## 2019-09-07 NOTE — Progress Notes (Signed)
ANTICOAGULATION CONSULT NOTE - Follow Up Consult  Pharmacy Consult for Eliquis Indication: DVT  No Known Allergies  Patient Measurements: Height: _0  (167.6 cm) Weight: 158 lb 8.2 oz (71.9 kg) IBW/kg (Calculated) : 59.3  Vital Signs: Temp: 97.6 F (36.4 C) (03/02 0657) Temp Source: Axillary (03/02 0657) BP: 93/61 (03/02 0657) Pulse Rate: 80 (03/02 0657)  Labs: Recent Labs    09/06/19 0406 09/07/19 0550  HGB 12.4 11.7*  HCT 39.7 38.4  PLT 392 330  CREATININE 0.66 0.75    Estimated Creatinine Clearance: 71.2 mL/min (by C-G formula based on SCr of 0.75 mg/dL).   Assessment:  Anticoag: Enox to Eliquis RLE + for DVT - pending TOC affordability consult. Hgb 11.7 down slightly. Plts 330 dropping a lot. Scr WNL - 2/14 CTA neg PE - 2/19 Dopplers - + for Rt. LE extensive DVT  Goal of Therapy:  Therapeutic oral anticoagulation   Plan:  - Eliquis 75m BID, then 576mdaily starting 3/4 Pharmacy will sign off. Please reconsult for further dosing assitance. Will follow DOAC peripherally.   Bani Gianfrancesco S. RoAlford HighlandPharmD, BCPS Clinical Staff Pharmacist [email or web address]  RoAlford HighlandCrystal Stillinger 09/07/2019,11:08 AM

## 2019-09-07 NOTE — Progress Notes (Signed)
Palliative:  HPI: 64 y.o. female  with past medical history of pulmonary fibrosis, pneumonia, GERD admitted on 08/21/2019 with worsening shortness of breath with hypoxia related to COVID pneumonia still with high oxygen requirements complicated but severe anxiety and desaturation with any activity.    I met today again with Stacy [name]. She is up in chair but having a bad day. She is uncomfortable and complains of dry and irritated nasal passage from high flow oxygen - awaiting humidity to be added. Nasal spray placed at bedside. Discussed trial of morphine to give her some relief. She tells me that she continues to consider hospice at home but is not sure she is ready for that yet. I reassured her that this is okay to consider her options and do what feels right for her. We did discuss more of hospice and what this could look like at home.   I discussed with Minette Brine, RN and Dr. Candiss Norse my discussions. Minette Brine will take her humidity to oxygen and low dose morphine. Dr. Candiss Norse will also consider increasing her clonazepam.   All questions/concerns addressed. Emotional support provided.   Exam: Alert, oriented. Sitting up in recliner. Appears anxious and short of breath. Labored breathing. Abd flat.   Plan: - Considering her options for transition to hospice at home vs continuing aggressive care and rehab. Realistically she could have 10L of oxygen at home with hospice. She does live alone but daughter in same apartment complex.   25 min  Vinie Sill, NP Palliative Medicine Team Pager [phone] (Please see [email or web address] for schedule) Team Phone [phone]    Greater than 50%  of this time was spent counseling and coordinating care related to the above assessment and plan

## 2019-09-07 NOTE — Progress Notes (Signed)
Physical Therapy Treatment Patient Details Name: Stacy [name] MRN: [id number] DOB: [birth year] Today's Date: 09/07/2019    History of Present Illness 64 year old with history of GERD, pulmonary fibrosis with chronic hypoxia on 4 L nasal cannula at home on Imuran and pirfenidone presented to the ED with shortness of breath on 2/11 found to have COVID-19 pneumonia.  CTA chest showed pulmonary fibrosis with reactive lymphadenopathy but no evidence of pulmonary embolism.  Completed course of remdesivir 2/17 and received convalescent plasma 2/15.  Not a candidate for Actemra as patient was on Imuran as outpatient.    PT Comments    Pt supine in bed with untouched lunch tray at the EoB in reach. Pt SaO2 is 96%O2 on 13L O2 via HFNC. Pt has received moisturizing canister and reports some relief from nasal burning. Pt inquires about SNF and what expectations will be made of her as she is very worried that it will be too much for her and she will always be uncomfortable. She is also anxious that she will not be able to see her family while she is there and she is terrified of dying alone in SNF.  Pt asks about hospice and state she is not really ready to give up. When asked why she hadn't eaten her lunch she states she doesn't feel like it. Encouraged her to eat as it will be more difficult to participate in therapy if she is not eating. Pt agreeable to sit EoB and eat some of her lunch. Pt SaO2 drops to mid 80s with eating but quickly rebounds with pursed lipped breathing. Pt is approaching the end of her 20 day COVID window and would benefit greatly from moving off COVID floor and being allowed family visitor. PT continues to recommend SNF level rehab, however with proper family support pt may be able to go home with daughter and receive HHPT to continue to progress her mobility. PT will continue to follow acutely.    Follow Up Recommendations  SNF     Equipment Recommendations  Other (comment)(TBD)     Recommendations for Other Services       Precautions / Restrictions Precautions Precautions: Fall;Other (comment) Precaution Comments: desat w/ min activity, highly anxious  Restrictions Weight Bearing Restrictions: No    Mobility  Bed Mobility Overal bed mobility: Needs Assistance Bed Mobility: Supine to Sit;Sit to Supine     Supine to sit: Min assist Sit to supine: Min guard   General bed mobility comments: min A for pt to pull up against PT as she was getting LE out of bed and squaring hips to EoB   Transfers                 General transfer comment: pt reports she has been sitting up in recliner for 5 hours and requests not to move to chair again       Balance Overall balance assessment: Needs assistance Sitting-balance support: Feet supported Sitting balance-Leahy Scale: Good                                      Cognition Arousal/Alertness: Awake/alert Behavior During Therapy: WFL for tasks assessed/performed;Anxious Overall Cognitive Status: Within Functional Limits for tasks assessed  Exercises Other Exercises Other Exercises: pursed lip breathing     General Comments General comments (skin integrity, edema, etc.): Pt on 13L HFNC, SaO2 majority of visit above 90%O2, with eating SaO2 dropped to 85%O2 but quickly rebounded with pursed lipped breathing      Pertinent Vitals/Pain Faces Pain Scale: Hurts little more Pain Location: Nasal cavities  Pain Descriptors / Indicators: Burning           PT Goals (current goals can now be found in the care plan section) Acute Rehab PT Goals Patient Stated Goal: go home PT Goal Formulation: With patient Time For Goal Achievement: 09/14/19 Potential to Achieve Goals: Fair Progress towards PT goals: Not progressing toward goals - comment(limited by lethargy and sadness)    Frequency    Min 2X/week      PT Plan Current plan  remains appropriate       AM-PAC PT "6 Clicks" Mobility   Outcome Measure  Help needed turning from your back to your side while in a flat bed without using bedrails?: None Help needed moving from lying on your back to sitting on the side of a flat bed without using bedrails?: A Little Help needed moving to and from a bed to a chair (including a wheelchair)?: A Little Help needed standing up from a chair using your arms (e.g., wheelchair or bedside chair)?: A Little Help needed to walk in hospital room?: A Lot Help needed climbing 3-5 steps with a railing? : Total 6 Click Score: 16    End of Session Equipment Utilized During Treatment: Oxygen Activity Tolerance: Other (comment);Patient limited by lethargy(sadness about loss of mobility ) Patient left: in bed;with call bell/phone within reach Nurse Communication: Mobility status PT Visit Diagnosis: Unsteadiness on feet (R26.81);Other abnormalities of gait and mobility (R26.89);Muscle weakness (generalized) (M62.81)     Time: 6429-0379 PT Time Calculation (min) (ACUTE ONLY): 34 min  Charges:  $Therapeutic Activity: 8-22 mins                     Nyeema Want B. Migdalia Dk PT, DPT Acute Rehabilitation Services Pager [phone] Office [phone]'   [place] 09/07/2019, 5:28 PM

## 2019-09-08 LAB — CBC WITH DIFFERENTIAL/PLATELET
Abs Immature Granulocytes: 0.14 10*3/uL — ABNORMAL HIGH (ref 0.00–0.07)
Basophils Absolute: 0.1 10*3/uL (ref 0.0–0.1)
Basophils Relative: 1 %
Eosinophils Absolute: 0.3 10*3/uL (ref 0.0–0.5)
Eosinophils Relative: 3 %
HCT: 37.7 % (ref 36.0–46.0)
Hemoglobin: 11.3 g/dL — ABNORMAL LOW (ref 12.0–15.0)
Immature Granulocytes: 2 %
Lymphocytes Relative: 16 %
Lymphs Abs: 1.5 10*3/uL (ref 0.7–4.0)
MCH: 23.2 pg — ABNORMAL LOW (ref 26.0–34.0)
MCHC: 30 g/dL (ref 30.0–36.0)
MCV: 77.4 fL — ABNORMAL LOW (ref 80.0–100.0)
Monocytes Absolute: 0.8 10*3/uL (ref 0.1–1.0)
Monocytes Relative: 8 %
Neutro Abs: 6.8 10*3/uL (ref 1.7–7.7)
Neutrophils Relative %: 70 %
Platelets: 324 10*3/uL (ref 150–400)
RBC: 4.87 MIL/uL (ref 3.87–5.11)
RDW: 18.2 % — ABNORMAL HIGH (ref 11.5–15.5)
WBC: 9.6 10*3/uL (ref 4.0–10.5)
nRBC: 0 % (ref 0.0–0.2)

## 2019-09-08 LAB — COMPREHENSIVE METABOLIC PANEL
ALT: 16 U/L (ref 0–44)
AST: 24 U/L (ref 15–41)
Albumin: 2.7 g/dL — ABNORMAL LOW (ref 3.5–5.0)
Alkaline Phosphatase: 74 U/L (ref 38–126)
Anion gap: 9 (ref 5–15)
BUN: 6 mg/dL — ABNORMAL LOW (ref 8–23)
CO2: 29 mmol/L (ref 22–32)
Calcium: 8.8 mg/dL — ABNORMAL LOW (ref 8.9–10.3)
Chloride: 99 mmol/L (ref 98–111)
Creatinine, Ser: 0.73 mg/dL (ref 0.44–1.00)
GFR calc Af Amer: >60 mL/min (ref >60)
GFR calc non Af Amer: >60 mL/min (ref >60)
Glucose, Bld: 96 mg/dL (ref 70–99)
Potassium: 3.8 mmol/L (ref 3.5–5.1)
Sodium: 137 mmol/L (ref 135–145)
Total Bilirubin: 0.5 mg/dL (ref 0.3–1.2)
Total Protein: 6 g/dL — ABNORMAL LOW (ref 6.5–8.1)

## 2019-09-08 LAB — C-REACTIVE PROTEIN: CRP: 1.9 mg/dL — ABNORMAL HIGH (ref <1.0)

## 2019-09-08 LAB — BRAIN NATRIURETIC PEPTIDE: B Natriuretic Peptide: 32.7 pg/mL (ref 0.0–100.0)

## 2019-09-08 LAB — MAGNESIUM: Magnesium: 1.8 mg/dL (ref 1.7–2.4)

## 2019-09-08 LAB — PROCALCITONIN: Procalcitonin: <0.1 ng/mL

## 2019-09-08 LAB — D-DIMER, QUANTITATIVE: D-Dimer, Quant: 0.87 ug/mL-FEU — ABNORMAL HIGH (ref 0.00–0.50)

## 2019-09-08 MED ORDER — FUROSEMIDE 10 MG/ML IJ SOLN
40.0000 mg | Freq: Once | INTRAMUSCULAR | Status: AC
  Administered 2019-09-08: 40 mg via INTRAVENOUS
  Filled 2019-09-08: qty 4

## 2019-09-08 NOTE — Progress Notes (Signed)
PROGRESS NOTE                                                                                                                                                                                                             Patient Demographics:    Stacy [name], is a 64 y.o. female, DOB - 19-Aug-1955, OIN:[id number]  Outpatient Primary MD for the patient is Marrian Salvage, [name]    LOS - 21  Admit date - 08/21/2019    CC - SOB     Brief Narrative -   64 year old with a history of 4-6 L O2 dependent pulmonary fibrosis (on imuran and pirfenidone chronically) who presented to the ED 2/13 with shortness of breath and a known Covid infection diagnosed 2/11.  Since admission she has been treated with remdesivir, steroids, and 2 units of convalescent plasma.  She has been diagnosed with extensive acute right lower extremity DVTs during his hospital stay.  Significant Events: 2/11 Covid test positive 2/13 admit to [facility] via [facility] ED 2/28 PCCM consult 3/1 transfer to [facility]  COVID-19 specific Treatment: Remdesivir 2/13 > 2/17 Decadron 2/13 > 2/23 Convalescent plasma 2/15   Subjective:   Patient in bed, appears comfortable, denies any headache, no fever, no chest pain or pressure, ++ shortness of breath , no abdominal pain. No focal weakness.    Assessment  & Plan :     1. Acute Hypoxic Resp. Failure due to Acute Covid 19 Viral Pneumonitis during the ongoing 2020 Covid 19 Pandemic - she had severe disease on top of her severe underlying ILD, of note she uses 4 to 6 L nasal cannula oxygen at baseline.  For COVID-19 pneumonia she was treated with IV steroids, remdesivir, convalescent plasma.  She is now showing gradual recovery.  Continue supportive care.  She is currently on 15 L high flow oxygen but in no distress, will try to titrate down oxygen, if we can get her down to 8 to 10 L we can try and get her to SNF or  home with hospice.  Encouraged the patient to sit up in chair in the daytime use I-S and flutter valve for pulmonary toiletry and then prone in bed when at night.     SpO2: 90 % O2 Flow Rate (L/min): 15 L/min FiO2 (%): 100 %  Recent Labs  Lab 09/06/19 0406 09/07/19 0550 09/08/19 0510  CRP  --  1.6* 1.9*  DDIMER 0.99* 0.92* 0.87*  BNP  --  30.0 32.7  PROCALCITON  --  <0.10 <0.10    2.  ILD.  Seen by pulmonary this admission, she is on 4 to 6 L oxygen at home at baseline, her home medication pirfenidone is continued.  Post discharge will follow with her pulmonologist Dr. Vaughan Browner.  3.  Acute right lower extremity DVT.  On Eliquis.  4.  GERD.  PPI.  5.  Severe anxiety.  Currently on Klonopin, will increase the dose and add low-dose Paxil as well.    Condition - Extremely Guarded  Family Communication  : Son updated on 09/07/2019, daughter 09/08/19  Code Status :  Full  Diet :   Diet Order            Diet regular Room service appropriate? Yes; Fluid consistency: Thin  Diet effective now               Disposition Plan  : Stay in PCU still on liters high flow nasal cannula oxygen for hypoxia.  Has agreed to go to SNF temporarily or home with hospice depending on clinical progress.  Consults  : Seen by pulmonary and palliative care this admission.  Procedures  :    CTA.  No PE.  Lower extremity venous duplex.  Acute right leg DVT.  PUD Prophylaxis : PPI  DVT Prophylaxis  :  Eliquis  Lab Results  Component Value Date   PLT 324 09/08/2019    Inpatient Medications  Scheduled Meds: . apixaban  10 mg Oral BID   Followed by  . [START ON 09/09/2019] apixaban  5 mg Oral BID  . budesonide  1 puff Inhalation BID  . clonazepam  0.5 mg Oral BID  . furosemide  40 mg Intravenous Once  . Ipratropium-Albuterol  1 puff Inhalation Q6H  . multivitamin with minerals  1 tablet Oral Daily  . pantoprazole  40 mg Oral Daily  . PARoxetine  10 mg Oral Daily  . Pirfenidone  801  mg Oral TID   Continuous Infusions: PRN Meds:.acetaminophen, bisacodyl, chlorpheniramine-HYDROcodone, dextromethorphan-guaiFENesin, morphine, ondansetron (ZOFRAN) IV, phenol-menthol, polyethylene glycol, sodium chloride, traMADol  Antibiotics  :    Anti-infectives (From admission, onward)   Start     Dose/Rate Route Frequency Ordered Stop   08/22/19 1000  remdesivir 100 mg in sodium chloride 0.9 % 100 mL IVPB     100 mg 200 mL/hr over 30 Minutes Intravenous Daily 08/21/19 1314 08/25/19 1206   08/21/19 1400  remdesivir 200 mg in sodium chloride 0.9% 250 mL IVPB     200 mg 580 mL/hr over 30 Minutes Intravenous Once 08/21/19 1314 08/21/19 1437       Time Spent in minutes  30   Lala Lund M.D on 09/08/2019 at 11:17 AM  To page go to [email or web address] - password [email or web address]  [facility] -  Office  [phone]   See all Orders from today for further details    Objective:   Vitals:   09/07/19 1632 09/07/19 2005 09/08/19 0402 09/08/19 0800  BP: 111/80 101/66 109/77 116/68  Pulse: 81 94 84 (!) 110  Resp: (!) _0 Temp: 97.6 F (36.4 C) 98.1 F (36.7 C) 97.9 F (36.6 C) 98.1 F (36.7 C)  TempSrc:  Oral Oral Oral  SpO2: 97% 90% 97% 90%  Weight:      Height:  Wt Readings from Last 3 Encounters:  08/21/19 71.9 kg  08/19/19 68 kg  07/27/19 67.4 kg     Intake/Output Summary (Last 24 hours) at 09/08/2019 1117 Last data filed at 09/07/2019 2100 Gross per 24 hour  Intake 240 ml  Output --  Net 240 ml     Physical Exam  Awake Alert, No new F.N deficits, anxious affect [place].AT,PERRAL Supple Neck,No JVD, No cervical lymphadenopathy appriciated.  Symmetrical Chest wall movement, Good air movement bilaterally, fine rales RRR,No Gallops, Rubs or new Murmurs, No Parasternal Heave +ve B.Sounds, Abd Soft, No tenderness, No organomegaly appriciated, No rebound - guarding or rigidity. No Cyanosis, Clubbing or edema, No new Rash or bruise     Data Review:     CBC Recent Labs  Lab 09/02/19 0238 09/06/19 0406 09/07/19 0550 09/08/19 0510  WBC 12.7* 14.8* 9.4 9.6  HGB 12.6 12.4 11.7* 11.3*  HCT 42.1 39.7 38.4 37.7  PLT 433* 392 330 324  MCV 76.4* 74.8* 76.6* 77.4*  MCH 22.9* 23.4* 23.4* 23.2*  MCHC 29.9* 31.2 30.5 30.0  RDW 18.6* 18.3* 18.5* 18.2*  LYMPHSABS  --   --  1.6 1.5  MONOABS  --   --  0.9 0.8  EOSABS  --   --  0.2 0.3  BASOSABS  --   --  0.1 0.1    Chemistries  Recent Labs  Lab 09/02/19 0238 09/06/19 0406 09/07/19 0550 09/08/19 0510  NA 142 138 138 137  K 4.0 3.4* 4.2 3.8  CL 98 93* 98 99  CO2 33* 34* 30 29  GLUCOSE 88 93 87 96  BUN _0 6*  CREATININE 0.76 0.66 0.75 0.73  CALCIUM 9.1 9.2 8.9 8.8*  MG  --  2.1 2.1 1.8  AST  --  _1 ALT  --  _2 ALKPHOS  --  79 81 74  BILITOT  --  0.7 0.5 0.5   ------------------------------------------------------------------------------------------------------------------ No results for input(s): CHOL, HDL, LDLCALC, TRIG, CHOLHDL, LDLDIRECT in the last 72 hours.  No results found for: HGBA1C ------------------------------------------------------------------------------------------------------------------ No results for input(s): TSH, T4TOTAL, T3FREE, THYROIDAB in the last 72 hours.  Invalid input(s): FREET3  Cardiac Enzymes No results for input(s): CKMB, TROPONINI, MYOGLOBIN in the last 168 hours.  Invalid input(s): CK ------------------------------------------------------------------------------------------------------------------    Component Value Date/Time   BNP 32.7 09/08/2019 0510    Micro Results No results found for this or any previous visit (from the past 240 hour(s)).  Radiology Reports DG Chest 1 View  Result Date: 08/29/2019 CLINICAL DATA:  Pulmonary fibrosis EXAM: CHEST  1 VIEW COMPARISON:  08/21/2019, 08/22/2019 FINDINGS: Single frontal view of the chest again demonstrates basilar predominant pulmonary fibrosis. No new airspace  disease, effusion, or pneumothorax. The cardiac silhouette is unremarkable. IMPRESSION: 1. Continued bibasilar pulmonary fibrosis. No acute airspace disease. Electronically Signed   By: Randa Ngo M.D.   On: 08/29/2019 21:10   CT ANGIO CHEST PE W OR WO CONTRAST  Result Date: 08/22/2019 CLINICAL DATA:  Shortness of breath, history of pulmonary fibrosis EXAM: CT ANGIOGRAPHY CHEST WITH CONTRAST TECHNIQUE: Multidetector CT imaging of the chest was performed using the standard protocol during bolus administration of intravenous contrast. Multiplanar CT image reconstructions and MIPs were obtained to evaluate the vascular anatomy. CONTRAST:  46m OMNIPAQUE IOHEXOL 350 MG/ML SOLN COMPARISON:  07/20/2019 FINDINGS: Cardiovascular: Satisfactory opacification the bilateral pulmonary arteries to the segmental level. No evidence of pulmonary embolism. Enlargement of the pulmonary arteries, suggesting pulmonary  arterial hypertension. No evidence of thoracic aortic aneurysm or dissection. Atherosclerotic calcifications of the aortic arch. The heart is top-normal in size.  No pericardial effusion. Mediastinum/Nodes: Small mediastinal lymph nodes, including a dominant 12 mm short axis subcarinal node (series 5/image 77, previously 11 mm. Bilateral hilar/perihilar lymphadenopathy, measuring up to 13 mm short axis on the right, previously 12 mm. This appearance is favored to be reactive. Visualized right thyroid is mildly enlarged/nodular. Lungs/Pleura: Residual post infectious/inflammatory scarring in the left upper lobe (series 7/image 38). Multifocal patchy/ground-glass opacities with subpleural reticulation in the lungs bilaterally, lower lobe predominant, compatible with pulmonary fibrosis. Trace loculated pleural fluid along the left major fissure, minimally increased from the prior. No suspicious pulmonary nodules. No pneumothorax. Upper Abdomen: Visualized upper abdomen is notable for a dominant anterior right upper  pole renal cyst measuring at least 6.5 cm, incompletely visualized. Musculoskeletal: Visualized osseous structures are within normal limits. Review of the MIP images confirms the above findings. IMPRESSION: No evidence of pulmonary embolism. Pulmonary fibrosis, unchanged.  Suspected reactive lymphadenopathy. Prior left upper lobe pneumonia has resolved, with mild residual post infectious/inflammatory scarring. Suspected pulmonary arterial hypertension. Aortic Atherosclerosis (ICD10-I70.0). Electronically Signed   By: Julian Hy M.D.   On: 08/22/2019 01:10   DG Chest Port 1 View  Result Date: 09/07/2019 CLINICAL DATA:  Respiratory distress, COVID positive EXAM: PORTABLE CHEST 1 VIEW COMPARISON:  08/29/2019 FINDINGS: Airspace disease bilaterally, unchanged. Heart is normal size. No effusions or pneumothorax. No acute bony abnormality. IMPRESSION: Stable bilateral airspace disease, likely related to pulmonary fibrosis. No change. Electronically Signed   By: Rolm Baptise M.D.   On: 09/07/2019 08:49   DG Chest Port 1 View  Result Date: 08/21/2019 CLINICAL DATA:  Hypoxia, COVID-19. EXAM: PORTABLE CHEST 1 VIEW COMPARISON:  Chest x-rays dated 08/19/2019 and 07/20/2019 FINDINGS: Interstitial lung opacities are again seen bilaterally, RIGHT slightly greater than LEFT, most likely related to chronic interstitial lung disease/fibrosis. The LEFT upper lobe is now clear, compatible with resolution of the previously demonstrated LEFT upper lobe pneumonia. No evidence of a new pneumonia. No pleural effusion or pneumothorax is seen. Stable cardiomegaly. IMPRESSION: 1. Resolution of the previous LEFT upper lobe pneumonia. No new lung findings. No evidence of a new pneumonia. 2. Stable interstitial lung opacities bilaterally, most likely chronic interstitial lung disease/fibrosis. 3. Stable cardiomegaly. Electronically Signed   By: Franki Cabot M.D.   On: 08/21/2019 13:22   DG Chest Port 1 View  Result Date:  08/19/2019 CLINICAL DATA:  Shortness of breath. EXAM: PORTABLE CHEST 1 VIEW COMPARISON:  July 20, 2019. FINDINGS: Stable cardiomegaly. No pneumothorax or pleural effusion is noted. Stable bilateral interstitial lung opacities are noted most consistent with pulmonary fibrosis or scarring. Left upper lobe pneumonia on prior exam has resolved. Bony thorax is unremarkable. No definite acute abnormality is noted. IMPRESSION: Stable bilateral interstitial lung opacities are noted most consistent with pulmonary fibrosis or scarring. Electronically Signed   By: Marijo Conception M.D.   On: 08/19/2019 14:42   VAS [place] LOWER EXTREMITY VENOUS (DVT)  Result Date: 08/27/2019  Lower Venous DVTStudy Indications: Swelling.  Risk Factors: COVID 19 positive. Comparison Study: No prior studies. Performing Technologist: Oliver Hum RVT  Examination Guidelines: A complete evaluation includes B-mode imaging, spectral Doppler, color Doppler, and power Doppler as needed of all accessible portions of each vessel. Bilateral testing is considered an integral part of a complete examination. Limited examinations for reoccurring indications may be performed as noted. The reflux portion of the  exam is performed with the patient in reverse Trendelenburg.  +---------+---------------+---------+-----------+----------+--------------+ RIGHT    CompressibilityPhasicitySpontaneityPropertiesThrombus Aging +---------+---------------+---------+-----------+----------+--------------+ CFV      Full           Yes      Yes                                 +---------+---------------+---------+-----------+----------+--------------+ SFJ      Full                                                        +---------+---------------+---------+-----------+----------+--------------+ FV Prox  Full                                                        +---------+---------------+---------+-----------+----------+--------------+ FV Mid    Full                                                        +---------+---------------+---------+-----------+----------+--------------+ FV DistalFull                                                        +---------+---------------+---------+-----------+----------+--------------+ PFV      Full                                                        +---------+---------------+---------+-----------+----------+--------------+ POP      None           No       No                   Acute          +---------+---------------+---------+-----------+----------+--------------+ PTV      Full                                                        +---------+---------------+---------+-----------+----------+--------------+ PERO     Partial                                      Acute          +---------+---------------+---------+-----------+----------+--------------+ Gastroc  None                                         Acute          +---------+---------------+---------+-----------+----------+--------------+   +---------+---------------+---------+-----------+----------+--------------+  LEFT     CompressibilityPhasicitySpontaneityPropertiesThrombus Aging +---------+---------------+---------+-----------+----------+--------------+ CFV      Full           Yes      Yes                                 +---------+---------------+---------+-----------+----------+--------------+ SFJ      Full                                                        +---------+---------------+---------+-----------+----------+--------------+ FV Prox  Full                                                        +---------+---------------+---------+-----------+----------+--------------+ FV Mid   Full                                                        +---------+---------------+---------+-----------+----------+--------------+ FV DistalFull                                                         +---------+---------------+---------+-----------+----------+--------------+ PFV      Full                                                        +---------+---------------+---------+-----------+----------+--------------+ POP      Full           Yes      Yes                                 +---------+---------------+---------+-----------+----------+--------------+ PTV      Full                                                        +---------+---------------+---------+-----------+----------+--------------+ PERO     Full                                                        +---------+---------------+---------+-----------+----------+--------------+     Summary: RIGHT: - Findings consistent with acute deep vein thrombosis involving the right popliteal vein, right peroneal veins, and right gastrocnemius veins. - No cystic structure found in the popliteal fossa.  LEFT: - There is no evidence of deep vein thrombosis in  the lower extremity.  - No cystic structure found in the popliteal fossa.  *See table(s) above for measurements and observations. Electronically signed by Deitra Mayo MD on 08/27/2019 at 1:57:20 PM.    Final

## 2019-09-08 NOTE — Progress Notes (Signed)
Palliative:  HPI: 63 y.o.femalewith past medical history of pulmonary fibrosis, pneumonia, GERDadmitted on 2/13/2021with worsening shortness of breath with hypoxia related to COVID pneumonia still with high oxygen requirements complicated but severe anxiety and desaturation with any activity.   I met today with Stacy [name]. She is resting comfortably in bed. She is feeling better than she was yesterday. Today is a better day than yesterday. She tells me that they were discussing with her discharge today. They are recommending SNF since she is still requiring so much oxygen. She would prefer to go home and would go stay with her daughter where she says she would have someone with her 24/7. If her oxygen can be provided at home this is her preference. At this time she would like to wait on hospice but is still open and does believe that she will need this in the near future.   Will coordinate with CMRN/CSW for options of where she will have access to high flow oxygen she has been requiring (up to 15L but not consistently 15L). Will follow up tomorrow.  All questions/concerns addressed. Emotional support provided.   Exam: Awake, alert, oriented. Appears fatigued. Breathing is regular and unlabored. Abd flat. Generalized weakness.   Plan: - Considering options that will enable her to go home (to her daughter's home) so she will be able to be with her family. However she is open to SNF if this is the only option to provide her with the high oxygen she requires.  - Will discuss further tomorrow.   20 min   , NP Palliative Medicine Team Pager [phone] (Please see [email or web address] for schedule) Team Phone [phone]    Greater than 50%  of this time was spent counseling and coordinating care related to the above assessment and plan  

## 2019-09-08 NOTE — Progress Notes (Signed)
Physical Therapy Treatment Patient Details Name: Stacy [name] MRN: [id number] DOB: 25-Jul-1955 Today's Date: 09/08/2019    History of Present Illness 64 year old with history of GERD, pulmonary fibrosis with chronic hypoxia on 4 L nasal cannula at home on Imuran and pirfenidone presented to the ED with shortness of breath on 2/11 found to have COVID-19 pneumonia.  CTA chest showed pulmonary fibrosis with reactive lymphadenopathy but no evidence of pulmonary embolism.  Completed course of remdesivir 2/17 and received convalescent plasma 2/15.  Not a candidate for Actemra as patient was on Imuran as outpatient.    PT Comments    Pt finished with lunch having eaten about 75% of her meal and is agreeable to getting up to the chair. On entry SaO2 92%O2 on 15L O2 via HFNC.  Requested pt get out on opposite side of bed to walk around to recliner on other side. Pt obviously anxious but agrees. Prior to mobilization, educated pt on pursed lipped breathing and focusing on her breath when she feels anxious. Pt able to don her own socks and has success regulating her breath after exertion without becoming overly anxious. Pt able to get to side of bed and stand with min guard assist. Once pt began to ambulate PT requested pt to ambulate to door, causing pt to become anxious, however with guidance pt able to recover her breathing and was convinced to walk to the sink. At sink pt took standing rest break as SaO2 had dropped to 80%O2 with focus on pursed lipped breathing and was able to recover to 87%O2. With ambulation to chair SaO2 dropped to 73%O2. With verbal cuing for breathing and use of flutter valve pt able to recover to 90%O2 in about 7 minutes. Pt given Lasix at beginning of session and once in chair reports need to urinate. Purewick in place but does not work correctly and pt becomes saturated. PT provides clean washcloths and pt able to clean herself in standing. Chair cleaned and pt returns to seated. SaO2  dropped to 76%O2. At this point pt becomes anxious and requires max verbal cuing and encouragement to focus on breathing and after about 6 minutes is able to recover to 88%O2.  Pt very much wants to go home with her daughter with [facility] services rather than go to SNF. Pt is limited by her high oxygen demand. Educated patient on need to wean from high does oxygen in order to achieve her desired discharge. PT will continue to work with pt acutely to reduce her oxygen demand.    Follow Up Recommendations  Home health PT;Supervision/Assistance - 24 hour     Equipment Recommendations  Wheelchair (measurements PT);Wheelchair cushion (measurements PT);3in1 (PT)(TBD)       Precautions / Restrictions Precautions Precautions: Fall;Other (comment) Precaution Comments: desat w/ min activity, highly anxious  Restrictions Weight Bearing Restrictions: No    Mobility  Bed Mobility Overal bed mobility: Needs Assistance Bed Mobility: Supine to Sit;Sit to Supine     Supine to sit: Min guard     General bed mobility comments: min guard for safety with HoB elevated and use of bed rail to pull to EoB  Transfers Overall transfer level: Needs assistance   Transfers: Sit to/from Stand Sit to Stand: Min guard         General transfer comment: min guard for safety, good power up increased effort to steady herself in standing,   Ambulation/Gait Ambulation/Gait assistance: Min assist;Min guard Gait Distance (Feet): 22 Feet Assistive device: None   Gait  velocity: slowed Gait velocity interpretation: <1.31 ft/sec, indicative of household ambulator General Gait Details: min guard with use of furniture in room to steady,pt initally asked to walk from one side of bed to recliner on the other side, however once up asked pt to walk to door, pt refused but agreeable to walk to sink where she stood for ~1 min working on pursed lipped breathing, then able to walk back around foot of bed to recliner on the far  side.          Balance Overall balance assessment: Needs assistance Sitting-balance support: Feet supported Sitting balance-Leahy Scale: Good     Standing balance support: During functional activity;No upper extremity supported Standing balance-Leahy Scale: Fair Standing balance comment: with static standing, ocassional UE support needed with dynamic mobility                            Cognition Arousal/Alertness: Awake/alert Behavior During Therapy: WFL for tasks assessed/performed;Anxious Overall Cognitive Status: Within Functional Limits for tasks assessed                                        Exercises Other Exercises Other Exercises: pursed lip breathing     General Comments General comments (skin integrity, edema, etc.): Pt on 15L O2 via HFNC on entry, ropped to 73% able to  recover with 7 min breathing using flutter valve after ambulation , stood for pericare dropped to 76% increased difficulty with focus on breathing      Pertinent Vitals/Pain Pain Assessment: Faces Faces Pain Scale: Hurts little more Pain Location: generalized with movement Pain Descriptors / Indicators: Grimacing;Guarding Pain Intervention(s): Limited activity within patient's tolerance;Monitored during session;Repositioned           PT Goals (current goals can now be found in the care plan section) Acute Rehab PT Goals Patient Stated Goal: go home PT Goal Formulation: With patient Time For Goal Achievement: 09/14/19 Potential to Achieve Goals: Fair Progress towards PT goals: Progressing toward goals    Frequency    Min 3X/week      PT Plan Discharge plan needs to be updated;Frequency needs to be updated;Equipment recommendations need to be updated       AM-PAC PT "6 Clicks" Mobility   Outcome Measure  Help needed turning from your back to your side while in a flat bed without using bedrails?: None Help needed moving from lying on your back to  sitting on the side of a flat bed without using bedrails?: A Little Help needed moving to and from a bed to a chair (including a wheelchair)?: A Little Help needed standing up from a chair using your arms (e.g., wheelchair or bedside chair)?: A Little Help needed to walk in hospital room?: A Lot Help needed climbing 3-5 steps with a railing? : Total 6 Click Score: 16    End of Session Equipment Utilized During Treatment: Oxygen Activity Tolerance: Patient limited by fatigue;Other (comment)(anxiety with increased work of breathing) Patient left: in bed;with call bell/phone within reach Nurse Communication: Mobility status PT Visit Diagnosis: Unsteadiness on feet (R26.81);Other abnormalities of gait and mobility (R26.89);Muscle weakness (generalized) (M62.81)     Time: 1349-1430 PT Time Calculation (min) (ACUTE ONLY): 41 min  Charges:  $Gait Training: 23-37 mins $Therapeutic Activity: 8-22 mins  Ryenn Howeth B. Migdalia Dk PT, DPT Acute Rehabilitation Services Pager [phone] Office [phone]    [place] 09/08/2019, 5:42 PM

## 2019-09-08 NOTE — TOC Progression Note (Signed)
Transition of Care [facility]) - Progression Note    Patient Details  Name: Stacy [name] MRN: [id number] Date of Birth: [birth year]  [facility]) CM/SW [place], LCSW Phone Number: 09/08/2019, 3:06 PM  Clinical Narrative:    CSW received consult to see if SNF can take pt on 10L O2. [facility] reports they would be able to accept patients maximum on 10L O2 if patient is stable on it.   Expected Discharge Plan: [place] Barriers to Discharge: Continued Medical Work up  Expected Discharge Plan and Services Expected Discharge Plan: [place] In-house Referral: Clinical Social Work     Living arrangements for the past 2 months: Single Family Home                                       Social Determinants of Health (SDOH) Interventions    Readmission Risk Interventions Readmission Risk Prevention Plan 07/21/2019  Post Dischage Appt Complete  Medication Screening Complete  Transportation Screening Complete  Some recent data might be hidden

## 2019-09-09 LAB — COMPREHENSIVE METABOLIC PANEL
ALT: 16 U/L (ref 0–44)
AST: 25 U/L (ref 15–41)
Albumin: 2.8 g/dL — ABNORMAL LOW (ref 3.5–5.0)
Alkaline Phosphatase: 82 U/L (ref 38–126)
Anion gap: 13 (ref 5–15)
BUN: 7 mg/dL — ABNORMAL LOW (ref 8–23)
CO2: 29 mmol/L (ref 22–32)
Calcium: 8.9 mg/dL (ref 8.9–10.3)
Chloride: 97 mmol/L — ABNORMAL LOW (ref 98–111)
Creatinine, Ser: 0.67 mg/dL (ref 0.44–1.00)
GFR calc Af Amer: >60 mL/min (ref >60)
GFR calc non Af Amer: >60 mL/min (ref >60)
Glucose, Bld: 101 mg/dL — ABNORMAL HIGH (ref 70–99)
Potassium: 3.4 mmol/L — ABNORMAL LOW (ref 3.5–5.1)
Sodium: 139 mmol/L (ref 135–145)
Total Bilirubin: 0.5 mg/dL (ref 0.3–1.2)
Total Protein: 6.2 g/dL — ABNORMAL LOW (ref 6.5–8.1)

## 2019-09-09 LAB — CBC WITH DIFFERENTIAL/PLATELET
Abs Immature Granulocytes: 0.11 10*3/uL — ABNORMAL HIGH (ref 0.00–0.07)
Basophils Absolute: 0.1 10*3/uL (ref 0.0–0.1)
Basophils Relative: 1 %
Eosinophils Absolute: 0.3 10*3/uL (ref 0.0–0.5)
Eosinophils Relative: 3 %
HCT: 36.6 % (ref 36.0–46.0)
Hemoglobin: 11.3 g/dL — ABNORMAL LOW (ref 12.0–15.0)
Immature Granulocytes: 1 %
Lymphocytes Relative: 16 %
Lymphs Abs: 1.6 10*3/uL (ref 0.7–4.0)
MCH: 23.7 pg — ABNORMAL LOW (ref 26.0–34.0)
MCHC: 30.9 g/dL (ref 30.0–36.0)
MCV: 76.7 fL — ABNORMAL LOW (ref 80.0–100.0)
Monocytes Absolute: 0.8 10*3/uL (ref 0.1–1.0)
Monocytes Relative: 9 %
Neutro Abs: 6.7 10*3/uL (ref 1.7–7.7)
Neutrophils Relative %: 70 %
Platelets: 372 10*3/uL (ref 150–400)
RBC: 4.77 MIL/uL (ref 3.87–5.11)
RDW: 18.3 % — ABNORMAL HIGH (ref 11.5–15.5)
WBC: 9.6 10*3/uL (ref 4.0–10.5)
nRBC: 0 % (ref 0.0–0.2)

## 2019-09-09 LAB — BRAIN NATRIURETIC PEPTIDE: B Natriuretic Peptide: 28.9 pg/mL (ref 0.0–100.0)

## 2019-09-09 LAB — C-REACTIVE PROTEIN: CRP: 2.4 mg/dL — ABNORMAL HIGH (ref <1.0)

## 2019-09-09 LAB — MAGNESIUM: Magnesium: 2 mg/dL (ref 1.7–2.4)

## 2019-09-09 LAB — PROCALCITONIN: Procalcitonin: <0.1 ng/mL

## 2019-09-09 LAB — D-DIMER, QUANTITATIVE: D-Dimer, Quant: 0.71 ug/mL-FEU — ABNORMAL HIGH (ref 0.00–0.50)

## 2019-09-09 MED ORDER — OXYCODONE HCL 5 MG PO TABS: 2.5000 mg | ORAL_TABLET | ORAL | Status: DC | PRN

## 2019-09-09 MED ORDER — POTASSIUM CHLORIDE CRYS ER 20 MEQ PO TBCR
40.0000 meq | EXTENDED_RELEASE_TABLET | Freq: Once | ORAL | Status: AC
  Administered 2019-09-09: 40 meq via ORAL
  Filled 2019-09-09: qty 2

## 2019-09-09 NOTE — Progress Notes (Addendum)
Hydrologist [facility]) Hospital Liaison: RN note     Notified by Transition of [place], RN of patient/family request for [facility] services at home after discharge. Chart and patient information reviewed by [facility] physician. Hospice eligibility confirmed.     Writer spoke with  Daughter, Angela Nevin  to initiate education related to hospice philosophy, services and team approach to care.  Angela Nevin verbalized understanding of information given. Per discussion, plan is for discharge to home by  PTAR.   Please send signed and completed DNR form home with patient/family. Patient will need prescriptions for discharge comfort medications.      DME needs have been discussed, patient currently has the following equipment in the home:  none.  Patient/family requests the following DME for delivery to the home: Oxygen for up to 15L, W/C, 3N1 and shower chair. [place] equipment manager has been notified and will contact DME provider to arrange delivery to the home. Home address has been verified and is correct in the chart.   Angela Nevin is the family member to contact to arrange time of delivery.      [facility] aware of the above. Please notify [facility] when patient is ready to leave the unit at discharge. (Call [phone] or [phone] after 5pm.) [facility] information and contact numbers given to  [facility].       Please call with any hospice related questions.      Thank you for this referral.      Farrel Gordon, RN, [facility] (listed on AMION under Hospice and [place] [facility] [place])   [phone]

## 2019-09-09 NOTE — TOC Progression Note (Addendum)
Transition of Care [facility]) - Progression Note    Patient Details  Name: Stacy [name] MRN: [id number] Date of Birth: 07/01/1956  Transition [facility]) CM/SW Contact  Markon Jares, Abelino Derrick, RN Phone Number: 09/09/2019, 12:55 PM  Clinical Narrative:   CM informed that pt has changed her mind and no longer interested in SNF but would like to discharge home.  Palliative initiated [place] prior to CM discussing discharge options with pt and home with hospice had been discussed.  CM contacted pt and daughter to discuss discharge options with current needs.  CM explained that the barrier with HH and home oxygen would be the amount of required oxygen - per palliative NP pt would need to go home with the capability of 15 liters home oxygen.  Pt is currently on 14 liters [place].  CM informed pt and daughter that hospice agencies have a higher threshold for oxygen requirements.  Both are in agreement with home with hospice.  CM provided medicare.gov hospice list - both in agreement with [facility]  CM contacted liaison with [facility] - liaison confirms that agency can provide 15 liters home oxygen.  Pt will also need wheelchair and 3:1.  Official referral given to agency;  Agency to review pts chart for acceptability and follow up with CM  Update:  Agency can accept pt and will order equipment.  PTAR forms on shadow chart   Expected Discharge Plan: Home w Hospice Care Barriers to Discharge: Continued Medical Work up  Expected Discharge Plan and Services Expected Discharge Plan: [place] In-house Referral: Clinical Social Work   Post Acute Care Choice: Durable Medical Equipment Living arrangements for the past 2 months: [place]: Hospice and [place] Date [place]: 09/09/19       Social Determinants of Health (SDOH) Interventions    Readmission Risk Interventions Readmission Risk Prevention Plan  07/21/2019  Post Dischage Appt Complete  Medication Screening Complete  Transportation Screening Complete  Some recent data might be hidden

## 2019-09-09 NOTE — Progress Notes (Addendum)
Occupational Therapy Treatment Patient Details Name: Stacy [name] MRN: [id number] DOB: 18-Jun-1956 Today's Date: 09/09/2019    History of present illness 64 year old with history of GERD, pulmonary fibrosis with chronic hypoxia on 4 L nasal cannula at home on Imuran and pirfenidone presented to the ED with shortness of breath on 2/11 found to have COVID-19 pneumonia.  CTA chest showed pulmonary fibrosis with reactive lymphadenopathy but no evidence of pulmonary embolism.  Completed course of remdesivir 2/17 and received convalescent plasma 2/15.  Not a candidate for Actemra as patient was on Imuran as outpatient.   OT comments  Pt received on 10 LO2 and continued limitations in activity tolerance. Pt reported desire to use BSC. Assisted pt in stand pivot to/from [facility] at min guard to ensure balance maintenance. Pt Supervision for peri care on BSC leaning side to side, min guard to don brief over waist. Pt Min A to don brief with assistance needed over feet due to increasing fatigue and desats. Pt dropped to 70% after exertion on 10 L O2. Pt required cues and at least 15 min to recover to upper 80s with 10 LO2. Provided energy conservation handout to take home and assist with maintaining independence with ADLs. Pt reports planned DC home with family and hospice services tomorrow.    Follow Up Recommendations  Home health OT;Supervision/Assistance - 24 hour;Other (comment)    Equipment Recommendations  None recommended by OT    Recommendations for Other Services      Precautions / Restrictions Precautions Precautions: Fall;Other (comment) Precaution Comments: desat w/ min activity, highly anxious  Restrictions Weight Bearing Restrictions: No       Mobility Bed Mobility               General bed mobility comments: OOB in recliner  Transfers Overall transfer level: Needs assistance Equipment used: None Transfers: Stand Pivot Transfers;Sit to/from Stand Sit to Stand: Min  guard Stand pivot transfers: Min guard       General transfer comment: min guard for safety with stand pivot to and from [facility]     Balance Overall balance assessment: Needs assistance Sitting-balance support: Feet supported Sitting balance-Leahy Scale: Good     Standing balance support: During functional activity;No upper extremity supported Standing balance-Leahy Scale: Fair Standing balance comment: with static standing, ocassional UE support needed with dynamic mobility                           ADL either performed or assessed with clinical judgement   ADL Overall ADL's : Needs assistance/impaired                     Lower Body Dressing: Min guard;Sit to/from stand Lower Body Dressing Details (indicate cue type and reason): Min A to don brief with assistance needed around feet Toilet Transfer: Stand-pivot;BSC;Min Psychiatric nurse Details (indicate cue type and reason): Cues for breathing techniques and pacing Toileting- Clothing Manipulation and Hygiene: Supervision/safety;Sit to/from stand;Min guard;Sitting/lateral lean Toileting - Clothing Manipulation Details (indicate cue type and reason): min guard to don brief over waist, supervision leaning for peri care on [facility]             Vision       Perception     Praxis      Cognition Arousal/Alertness: Awake/alert Behavior During Therapy: [facility] for tasks assessed/performed;Anxious Overall Cognitive Status: Within Functional Limits for tasks assessed  Exercises Exercises: Other exercises Other Exercises Other Exercises: pursed lip breathing    Shoulder Instructions       General Comments Pt received on 10 L O2, desats down to 70% at lowest after movement and required cues for breathing techniques and 15 min to recover to upper 80s.     Pertinent Vitals/ Pain       Pain Assessment: 0-10 Pain Score: 4  Pain Location: chest with  increased work of breathing Pain Descriptors / Indicators: Contraction;Throbbing;Tightness Pain Intervention(s): Limited activity within patient's tolerance;Monitored during session  Home Living                                          Prior Functioning/Environment              Frequency  Min 3X/week        Progress Toward Goals  OT Goals(current goals can now be found in the care plan section)  Progress towards OT goals: Progressing toward goals  Acute Rehab OT Goals Patient Stated Goal: go home OT Goal Formulation: With patient Time For Goal Achievement: 09/21/19 Potential to Achieve Goals: Good ADL Goals Pt Will Perform Grooming: with modified independence;standing Pt Will Perform Upper Body Bathing: with modified independence;sitting Pt Will Perform Lower Body Bathing: with modified independence;sit to/from stand Pt Will Transfer to Toilet: with modified independence;ambulating Additional ADL Goal #1: Patient will maintain SpO2>90 during functional mobility with ADLs.  Plan Discharge plan remains appropriate    Co-evaluation                 AM-PAC OT "6 Clicks" Daily Activity     Outcome Measure   Help from another person eating meals?: None Help from another person taking care of personal grooming?: A Little Help from another person toileting, which includes using toliet, bedpan, or urinal?: A Little Help from another person bathing (including washing, rinsing, drying)?: A Little Help from another person to put on and taking off regular upper body clothing?: A Little Help from another person to put on and taking off regular lower body clothing?: A Little 6 Click Score: 19    End of Session Equipment Utilized During Treatment: Oxygen  OT Visit Diagnosis: Unsteadiness on feet (R26.81);Muscle weakness (generalized) (M62.81)   Activity Tolerance Patient tolerated treatment well;Other (comment)(limited by axniety and O2 desats)    Patient Left in chair;Other (comment)(with PT)   Nurse Communication          Time: 5170-0174 OT Time Calculation (min): 31 min  Charges: OT General Charges $OT Visit: 1 Visit OT Treatments $Self Care/Home Management : 23-37 mins  Layla Maw, OTR/L   Layla Maw 09/09/2019, 3:04 PM

## 2019-09-09 NOTE — Progress Notes (Signed)
Patient suffers from Pulmonary Fibrosis and COVID PNA which impairs their ability to perform daily activities like ambulating in the home.  A walker alone will not resolve the issues with performing activities of daily living. A wheelchair will allow patient to safely perform daily activities.  The patient can self propel in the home or has a caregiver who can provide assistance.     Terryon Pineiro B. Migdalia Dk PT, DPT Acute Rehabilitation Services Pager [phone] Office [phone]

## 2019-09-09 NOTE — Progress Notes (Signed)
Physical Therapy Progress Note  Physical Therapy Comments: Pt sitting up in chair on entry on 10 L O2 via [place] with SaO2 in the low 90s. Pt reports she is going home with her daughter tomorrow and she is obviously very happy with this arrangement. Discussed with pt what her goals are for her mobility when she gets to her daughters home. Pt reports wanting to be able to walk short distances. Discussed that therapist goal is for her to be able to transfer to and from [facility] while being able to manage increased work of breathing and oxygen desaturation that will accompany such a move. Pt in agreement. Pt is min guard for transfer to [facility]. Once she has transferred SaO2 dropped to low 70%O2, with maximal verbal cuing for use of flutter valve and inhalation through nose pt requires at least 15 minutes to recover to upper 80% SaO2. PT instructed pursed lipped breathing in anticipation of oxygen desaturation with pericare and return to recliner. Pt able to follow, however experiences increased anxiety and inability to perform continued pursed lipped breathing with SaO2 dropping to min 60%O2. Pt requires increase cuing in pursed lipped breathing and is only able to recover to min 70s with 5 minutes. Supplemental O2 increased to 12 L and continue with slow progress to 90%O2 over the next 12 minutes. D/c home with hospice is appropriate as they can provide the increased supplemental oxygen required for completion of basic ADLs.     09/09/19 1500  PT Visit Information  Last PT Received On 09/09/19  Assistance Needed +1  PT/OT/SLP Co-Evaluation/Treatment Yes  Reason for Co-Treatment Necessary to address cognition/behavior during functional activity;Other (comment) (due to pt anxiety, desats, and activity tolerance)  PT goals addressed during session Mobility/safety with mobility  History of Present Illness 64 year old with history of GERD, pulmonary fibrosis with chronic hypoxia on 4 L nasal cannula at home on Imuran  and pirfenidone presented to the ED with shortness of breath on 2/11 found to have COVID-19 pneumonia.  CTA chest showed pulmonary fibrosis with reactive lymphadenopathy but no evidence of pulmonary embolism.  Completed course of remdesivir 2/17 and received convalescent plasma 2/15.  Not a candidate for Actemra as patient was on Imuran as outpatient.  Subjective Data  Patient Stated Goal go home  Precautions  Precautions Fall;Other (comment)  Precaution Comments desat w/ min activity, highly anxious   Restrictions  Weight Bearing Restrictions No  Pain Assessment  Pain Assessment 0-10  Pain Score 4  Pain Location chest with increased work of breathing  Pain Descriptors / Indicators Contraction;Throbbing;Tightness  Pain Intervention(s) Limited activity within patient's tolerance;Monitored during session;Repositioned  Cognition  Arousal/Alertness Awake/alert  Behavior During Therapy WFL for tasks assessed/performed;Anxious  Overall Cognitive Status Within Functional Limits for tasks assessed  Bed Mobility  General bed mobility comments OOB in recliner  Transfers  Overall transfer level Needs assistance  Transfers Stand Pivot Transfers  Sit to Stand Min guard  General transfer comment min guard for safety with stand pivot to and from [facility]   Ambulation/Gait  General Gait Details unable due to increased O2 demand  Balance  Overall balance assessment Needs assistance  Sitting-balance support Feet supported  Sitting balance-Leahy Scale Good  Standing balance support During functional activity;No upper extremity supported  Standing balance-Leahy Scale Fair  Standing balance comment with static standing, ocassional UE support needed with dynamic mobility  General Comments  General comments (skin integrity, edema, etc.) Pt received on 10 L O2, desats down to 70% at lowest  after movement and required cues for breathing techniques and 15 min to recover to upper 80s.   Exercises  Exercises  Other exercises  Other Exercises  Other Exercises pursed lip breathing   PT - End of Session  Equipment Utilized During Treatment Oxygen  Activity Tolerance Patient limited by fatigue;Other (comment) (anxiety with increased work of breathing)  Patient left in bed;with call bell/phone within reach  Nurse Communication Mobility status   PT - Assessment/Plan  PT Plan Discharge plan needs to be updated;Frequency needs to be updated;Equipment recommendations need to be updated  PT Visit Diagnosis Unsteadiness on feet (R26.81);Other abnormalities of gait and mobility (R26.89);Muscle weakness (generalized) (M62.81)  PT Frequency (ACUTE ONLY) Min 3X/week  Follow Up Recommendations Supervision/Assistance - 24 hour;Other (comment) (Hospice)  PT equipment Wheelchair (measurements PT);Wheelchair cushion (measurements PT);3in1 (PT) (TBD)  AM-PAC PT "6 Clicks" Mobility Outcome Measure (Version 2)  Help needed turning from your back to your side while in a flat bed without using bedrails? 4  Help needed moving from lying on your back to sitting on the side of a flat bed without using bedrails? 3  Help needed moving to and from a bed to a chair (including a wheelchair)? 3  Help needed standing up from a chair using your arms (e.g., wheelchair or bedside chair)? 3  Help needed to walk in hospital room? 2  Help needed climbing 3-5 steps with a railing?  1  6 Click Score 16  Consider Recommendation of Discharge To: Home with [facility]  PT Goal Progression  Progress towards PT goals Progressing toward goals  Acute Rehab PT Goals  PT Goal Formulation With patient  Time For Goal Achievement 09/14/19  Potential to Achieve Goals Fair  PT Time Calculation  PT Start Time (ACUTE ONLY) 1122  PT Stop Time (ACUTE ONLY) 1222  PT Time Calculation (min) (ACUTE ONLY) 60 min  PT General Charges  $$ ACUTE PT VISIT 1 Visit  PT Treatments  $Therapeutic Activity 23-37 mins   Jenah Vanasten B. Migdalia Dk PT, DPT Acute  Rehabilitation Services Pager [phone] Office [phone]

## 2019-09-09 NOTE — Progress Notes (Signed)
Palliative:  HPI:64 y.o.femalewith past medical history of pulmonary fibrosis, pneumonia, GERDadmitted on 2/13/2021with worsening shortness of breath with hypoxia related to COVID pneumonia still with high oxygen requirements complicated but severe anxiety and desaturation with any activity.   I met again today with Stacy [name]. I have communicated with Stacy [name], CMRN this morning as well. I discussed with Stacy [name] further the option for home with hospice and she confirms this desire if this enables her to be home with her family. She is hoping for more time to spend with her family but is also able to tell me that she knows her time is limited as well. She has been considering hospice at home already. I did tell her that I ran in to her pulmonologist and updated him and that he felt hospice at home would be an appropriate option as well.   Her main goal is to be with her family. She plans to go to her daughters home and would like bedside commode, wheelchair, oxygen but does not desire a hospital bed at this time. She tells me that she cannot tell a benefit from prn morphine and this makes her feel nauseated and was unable to even eat her lunch. Agreed to d/c morphine. Will begin low dose OxyIR in place or morphine for severe pain or shortness of breath.   She has no further questions or concerns. Emotional support provided. I left her to rest.   Exam: Alert, oriented. Fatigued. Breathing regular, unlabored. No increased work of breathing. Abd flat. Generalized weakness and activity limited more so by SOB/hypoxia.   Plan: - D/C morphine and added OxyIR 2.5 mg every 4 hours as needed.  - Continue clonazepam 0.5 mg every 12 hours as this is helping with underlying anxiety.  - Planning home with hospice.   15 min  Vinie Sill, NP Palliative Medicine Team Pager [phone] (Please see [email or web address] for schedule) Team Phone [phone]    Greater than 50%  of this time was spent  counseling and coordinating care related to the above assessment and plan

## 2019-09-09 NOTE — Plan of Care (Signed)
  Problem: Education: Goal: Knowledge of risk factors and measures for prevention of condition will improve Outcome: Progressing   Problem: Coping: Goal: Psychosocial and spiritual needs will be supported Outcome: Progressing   Problem: Respiratory: Goal: Will maintain a patent airway Outcome: Progressing Goal: Complications related to the disease process, condition or treatment will be avoided or minimized Outcome: Progressing   Problem: Clinical Measurements: Goal: Will remain free from infection Outcome: Progressing   Problem: Activity: Goal: Risk for activity intolerance will decrease Outcome: Progressing   Problem: Safety: Goal: Ability to remain free from injury will improve Outcome: Progressing   Problem: Skin Integrity: Goal: Risk for impaired skin integrity will decrease Outcome: Progressing

## 2019-09-10 ENCOUNTER — Telehealth: Payer: Self-pay | Admitting: Emergency Medicine

## 2019-09-10 ENCOUNTER — Telehealth: Payer: Self-pay | Admitting: Adult Health

## 2019-09-10 LAB — CBC WITH DIFFERENTIAL/PLATELET
Abs Immature Granulocytes: 0.07 10*3/uL (ref 0.00–0.07)
Basophils Absolute: 0.1 10*3/uL (ref 0.0–0.1)
Basophils Relative: 1 %
Eosinophils Absolute: 0.4 10*3/uL (ref 0.0–0.5)
Eosinophils Relative: 5 %
HCT: 35.9 % — ABNORMAL LOW (ref 36.0–46.0)
Hemoglobin: 10.9 g/dL — ABNORMAL LOW (ref 12.0–15.0)
Immature Granulocytes: 1 %
Lymphocytes Relative: 20 %
Lymphs Abs: 1.4 10*3/uL (ref 0.7–4.0)
MCH: 23.1 pg — ABNORMAL LOW (ref 26.0–34.0)
MCHC: 30.4 g/dL (ref 30.0–36.0)
MCV: 76.1 fL — ABNORMAL LOW (ref 80.0–100.0)
Monocytes Absolute: 0.6 10*3/uL (ref 0.1–1.0)
Monocytes Relative: 8 %
Neutro Abs: 4.6 10*3/uL (ref 1.7–7.7)
Neutrophils Relative %: 65 %
Platelets: 301 10*3/uL (ref 150–400)
RBC: 4.72 MIL/uL (ref 3.87–5.11)
RDW: 18.3 % — ABNORMAL HIGH (ref 11.5–15.5)
WBC: 7.1 10*3/uL (ref 4.0–10.5)
nRBC: 0 % (ref 0.0–0.2)

## 2019-09-10 LAB — COMPREHENSIVE METABOLIC PANEL
ALT: 16 U/L (ref 0–44)
AST: 26 U/L (ref 15–41)
Albumin: 2.7 g/dL — ABNORMAL LOW (ref 3.5–5.0)
Alkaline Phosphatase: 77 U/L (ref 38–126)
Anion gap: 11 (ref 5–15)
BUN: <5 mg/dL — ABNORMAL LOW (ref 8–23)
CO2: 28 mmol/L (ref 22–32)
Calcium: 8.9 mg/dL (ref 8.9–10.3)
Chloride: 101 mmol/L (ref 98–111)
Creatinine, Ser: 0.69 mg/dL (ref 0.44–1.00)
GFR calc Af Amer: >60 mL/min (ref >60)
GFR calc non Af Amer: >60 mL/min (ref >60)
Glucose, Bld: 86 mg/dL (ref 70–99)
Potassium: 4.4 mmol/L (ref 3.5–5.1)
Sodium: 140 mmol/L (ref 135–145)
Total Bilirubin: 0.6 mg/dL (ref 0.3–1.2)
Total Protein: 5.9 g/dL — ABNORMAL LOW (ref 6.5–8.1)

## 2019-09-10 LAB — MAGNESIUM: Magnesium: 2 mg/dL (ref 1.7–2.4)

## 2019-09-10 LAB — D-DIMER, QUANTITATIVE: D-Dimer, Quant: 0.58 ug/mL-FEU — ABNORMAL HIGH (ref 0.00–0.50)

## 2019-09-10 LAB — C-REACTIVE PROTEIN: CRP: 2 mg/dL — ABNORMAL HIGH (ref <1.0)

## 2019-09-10 LAB — BRAIN NATRIURETIC PEPTIDE: B Natriuretic Peptide: 39.1 pg/mL (ref 0.0–100.0)

## 2019-09-10 MED ORDER — ALBUTEROL SULFATE HFA 108 (90 BASE) MCG/ACT IN AERS: 2.0000 | INHALATION_SPRAY | Freq: Four times a day (QID) | RESPIRATORY_TRACT | 0 refills | Status: DC | PRN

## 2019-09-10 MED ORDER — APIXABAN 5 MG PO TABS: 5.0000 mg | ORAL_TABLET | Freq: Two times a day (BID) | ORAL | 0 refills | Status: DC

## 2019-09-10 NOTE — Consult Note (Signed)
   [facility] CM Inpatient Consult   09/10/2019  Stacy [name] 28-Oct-1955 [id number]   Follow up: Disposition/Transition needs [facility] Medicare  Patient is currently for home with hospice care.  This Probation officer was following for [employer] needs, if appropriate with the [employer] [place].  Brief chart review reveals patient is for home with Hospice care.   No [facility] Care Management needs are noted as needs will be met with [employer]. Patient will receive full care management under Hospice.  Plan:  Sign off at disposition/transition.  For questions, please contact:  Natividad Brood, RN BSN [facility] Liaison  [phone] business mobile phone Toll free office [phone]  Fax number: [phone] [name]

## 2019-09-10 NOTE — TOC Transition Note (Signed)
Transition of Care [facility]) - CM/SW Discharge Note   Patient Details  Name: Stacy [name] MRN: [id number] Date of Birth: 1955-10-09  [facility] CM/SW Contact:  Maryclare Labrador, RN Phone Number: 09/10/2019, 9:32 AM   Clinical Narrative:   Home equipment confirmed delivered in the home per [facility].  CM spoke with pt this am - pt very appreciative of discharge plan with hospice.  Pt in agreement for transport home today by [facility] - transport pkg on shadow chart - CM confirmed with [facility] that they can transport a pt needing 15 liters oxygen (pt currently on 10 liters).  Bedside nurse to call daughter Angela Nevin to determine preferred time for pt to arrive home before calling [facility] for tansport  CM called in free 30 day Eliquis benefit directly into pts pharmacy.  [facility] confirms that they have the capability of 15 liters oxygen in the home.    No other CM needs - CM signing off   Final next level of care: Home w Hospice Care Barriers to Discharge: Barriers Resolved   Patient Goals and CMS Choice Patient states their goals for this hospitalization and ongoing recovery are:: Feel better CMS Medicare.gov Compare Post Acute Care list provided to:: Patient Choice offered to / list presented to : Patient, Adult Children(Dsaughter Carla)  Discharge Placement                Patient to be transferred to facility by: [place] Name of family member notified: CM unable to reach daughter Angela Nevin - bedside nurse to confirm preferred time for transport home prior to calling [facility] Patient and family notified of of transfer: 09/10/19(Pt notified this am and remains in agreement)  Discharge Plan and Services In-house Referral: Clinical Social Work   Post Acute Care Choice: Museum/gallery conservator                      [facility] Agency: Hospice and [place] Date [facility] Agency Contacted: 09/09/19      Social Determinants of Health (SDOH) Interventions     Readmission  Risk Interventions Readmission Risk Prevention Plan 07/21/2019  Post Dischage Appt Complete  Medication Screening Complete  Transportation Screening Complete  Some recent data might be hidden

## 2019-09-10 NOTE — Telephone Encounter (Signed)
Not sure was referred to hospice at discharge from hospitalist Will need to ask  Dr. Vaughan Browner or ask if Hospice MD will take .

## 2019-09-10 NOTE — Telephone Encounter (Signed)
Jen from [facility] and states pt is being discharged 3.5.2021 and they are wanting to know if Mickel Baas agrees to be thew attending. Call back number [phone]. Delsa Sale said can leave voice mail.

## 2019-09-10 NOTE — Telephone Encounter (Signed)
Called and spoke with Delsa Sale from [facility] letting her know that we are checking with Dr. Vaughan Browner to see if he would be the attending or if the hospice MD would take. Stated to [place] that we would let her know once we hear back from Dr. Vaughan Browner. Made her aware that he was not avail to at least see the message until  Tues 3/9 and she verbalized understanding.  Dr. Vaughan Browner, please advise if you will be the attending for pt's care or if hospice MD should take over?

## 2019-09-10 NOTE — Telephone Encounter (Signed)
Spoke with Delsa Sale and info given.

## 2019-09-10 NOTE — Progress Notes (Signed)
Manufacturing engineer (ACC)  Spoke with daughter Angela Nevin, DME is set up and family is ready for pt to be discharged.  Pt will need ambulance transport home.  If comfort medications are needed, please arrange so there is no lapse in her comfort prior to hospice services starting.  Venia Carbon RN, BSN, [facility] Liaison (in [place]) [phone]

## 2019-09-10 NOTE — Progress Notes (Signed)
[facility] called for patient's transport home.

## 2019-09-10 NOTE — Telephone Encounter (Signed)
Received a call from [facility] with [place]. Stacy [name] is trying to verify who is going to be pt's attending for her hospice care.  Tammy, please advise on this who is to be pt's attending for hospice care.

## 2019-09-10 NOTE — Discharge Summary (Signed)
Berenise Hunton SNG:[id number] DOB: 1955-11-05 DOA: 08/21/2019  PCP: Marrian Salvage, [name]  Admit date: 08/21/2019  Discharge date: 09/10/2019  Admitted From: Home   Disposition:  Home   Recommendations for Outpatient Follow-up:   Follow up with PCP in 1-2 weeks  PCP Please obtain BMP/CBC, 2 view CXR in 1week,  (see Discharge instructions)   PCP Please follow up on the following pending results:    Home Health: Hospice   Equipment/Devices:   Consultations: [place] Discharge Condition: Fair   CODE STATUS: DNR   Diet Recommendation: Heart Healthy   Diet Order            Diet - low sodium heart healthy        Diet regular Room service appropriate? Yes; Fluid consistency: Thin  Diet effective now               CC - SOB   Brief history of present illness from the day of admission and additional interim summary     64 year old with a history of 4-6 L O2 dependent pulmonary fibrosis (on imuran and pirfenidone chronically) who presented to the ED 2/13 with shortness of breath and a known Covid infection diagnosed 2/11.  Since admission she has been treated with remdesivir, steroids, and 2 units of convalescent plasma. She has been diagnosed with extensive acute right lower extremity DVTs during his hospital stay.  Significant Events: 2/11 Covid test positive 2/13 admit to [facility] via [facility] ED 2/28 PCCM consult 3/1 transfer to [facility]  COVID-19 specific Treatment: Remdesivir 2/13 > 2/17 Decadron 2/13 > 2/23 Convalescent plasma 2/15                                                                 Hospital Course    1. Acute Hypoxic Resp. Failure due to Acute Covid 19 Viral Pneumonitis during the ongoing 2020 Covid 19 Pandemic - she had severe disease on top of her severe underlying ILD,  of note she uses 4 to 6 L nasal cannula oxygen at baseline.  For COVID-19 pneumonia she was treated with IV steroids, remdesivir, convalescent plasma.  She is now showing gradual recovery.   She is currently on 10 L oxygen at rest but in no distress, on activity she might require more oxygen, she has been arranged for 15 L of oxygen at home if needed.  She will be discharged home with home hospice.  Will follow with PCP and pulmonary physician.  Overall I think she can be monitored closely and in my opinion will likely show gradual and consistent improvement with likely baseline status in the next 4 to 6 weeks.  If declines then focus on hospice.  SpO2: 97 % O2 Flow Rate (L/min): 10 L/min FiO2 (%): 100 %  Recent Labs  Lab 09/06/19 0406 09/07/19 0550 09/08/19 0510 09/09/19 0200 09/10/19 0711  CRP  --  1.6* 1.9* 2.4* 2.0*  DDIMER 0.99* 0.92* 0.87* 0.71* 0.58*  BNP  --  30.0 32.7 28.9 39.1  PROCALCITON  --  <0.10 <0.10 <0.10  --     Hepatic Function Latest Ref Rng & Units 09/10/2019 09/09/2019 09/08/2019  Total Protein 6.5 - 8.1 g/dL 5.9(L) 6.2(L) 6.0(L)  Albumin 3.5 - 5.0 g/dL 2.7(L) 2.8(L) 2.7(L)  AST 15 - 41 U/L _0 ALT 0 - 44 U/L _1 Alk Phosphatase 38 - 126 U/L 77 82 74  Total Bilirubin 0.3 - 1.2 mg/dL 0.6 0.5 0.5  Bilirubin, Direct 0.0 - 0.2 mg/dL - - -    2.  ILD.  Seen by pulmonary this admission, she is on 4 to 6 L oxygen at home at baseline, her home medication pirfenidone is continued.  Post discharge will follow with her pulmonologist Dr. Vaughan Browner.  3.  Acute right lower extremity DVT.  On Eliquis.  4.  GERD.  PPI.  5.  Anxiety.    Continue home regimen follow with PCP and home hospice.  Discharge diagnosis     Principal Problem:   Acute respiratory failure with hypoxia (HCC) Active Problems:   Pulmonary fibrosis (HCC)   ILD (interstitial lung disease) (HCC)   Gastroesophageal reflux disease   Pneumonia due to COVID-19 virus   Pneumothorax, right    Goals of care, counseling/discussion   Encounter for hospice care discussion   Palliative care by specialist    Discharge instructions    Discharge Instructions    Diet - low sodium heart healthy   Complete by: As directed    Discharge instructions   Complete by: As directed    Use oxygen 8 to 10 L as needed to keep yourself comfortable, you can go up to 15 L if needed.    Follow with Primary MD Marrian Salvage, [name] and your Pulmonary MD in 7 days   Get CBC, CMP, 2 view Chest X ray -  checked next visit within 1 week by Primary MD    Activity: As tolerated with Full fall precautions use walker/cane & assistance as needed  Disposition Home   Diet: Heart Healthy   Special Instructions: If you have smoked or chewed Tobacco  in the last 2 yrs please stop smoking, stop any regular Alcohol  and or any Recreational drug use.  On your next visit with your primary care physician please Get Medicines reviewed and adjusted.  Please request your Prim.MD to go over all Hospital Tests and Procedure/Radiological results at the follow up, please get all Hospital records sent to your Prim MD by signing hospital release before you go home.  If you experience worsening of your admission symptoms, develop shortness of breath, life threatening emergency, suicidal or homicidal thoughts you must seek medical attention immediately by calling 911 or calling your MD immediately  if symptoms less severe.  You Must read complete instructions/literature along with all the possible adverse reactions/side effects for all the Medicines you take and that have been prescribed to you. Take any new Medicines after you have completely understood and accpet all the possible adverse reactions/side effects.   Increase activity slowly   Complete by: As directed    MyChart COVID-19 home monitoring program   Complete by: Sep 10, 2019    Is the patient willing to use the [place] for home monitoring?: Yes  Temperature monitoring   Complete by: Sep 10, 2019    After how many days would you like to receive a notification of this patient's flowsheet entries?: 1      Discharge Medications   Allergies as of 09/10/2019   No Known Allergies     Medication List    STOP taking these medications   sulfamethoxazole-trimethoprim 800-160 MG tablet Commonly known as: BACTRIM DS   traZODone 100 MG tablet Commonly known as: DESYREL     TAKE these medications   acetaminophen 500 MG tablet Commonly known as: TYLENOL Take 1,000 mg by mouth every 6 (six) hours as needed for moderate pain. Take 3 tablets twice daily   albuterol 108 (90 Base) MCG/ACT inhaler Commonly known as: VENTOLIN HFA Inhale 2 puffs into the lungs every 6 (six) hours as needed for wheezing or shortness of breath.   apixaban 5 MG Tabs tablet Commonly known as: ELIQUIS Take 1 tablet (5 mg total) by mouth 2 (two) times daily.   azaTHIOprine 50 MG tablet Commonly known as: IMURAN TAKE 3 TABLETS EVERY DAY What changed: See the new instructions.   Centrum Silver tablet Take 1 tablet by mouth daily.   chlorpheniramine-HYDROcodone 10-8 MG/5ML Suer Commonly known as: TUSSIONEX Take 5 mLs by mouth every 12 (twelve) hours as needed for cough.   dextromethorphan-guaiFENesin 30-600 MG 12hr tablet Commonly known as: MUCINEX DM Take 1 tablet by mouth 2 (two) times daily as needed for cough.   Esbriet 267 MG Tabs Generic drug: Pirfenidone TAKE 3 TABLETS (801MG) BY MOUTH THREE TIMES A DAY What changed: See the new instructions.   omeprazole 20 MG capsule Commonly known as: PRILOSEC Take 1 capsule (20 mg total) by mouth daily.   OXYGEN Inhale into the lungs. 3 liters per minute, uses when up and moving around   predniSONE 5 MG tablet Commonly known as: DELTASONE Take 5 mg by mouth daily with breakfast.   simvastatin 20 MG tablet Commonly known as: ZOCOR TAKE 1 TABLET DAILY AT 6PM (NO FURTHER REFILLS WITHOUT OFFICE  VISIT) What changed:   how much to take  how to take this  when to take this  additional instructions            Durable Medical Equipment  (From admission, onward)         Start     Ordered   09/09/19 0849  For home use only DME oxygen  Once    Question Answer Comment  Length of Need 6 Months   Mode or (Route) Nasal cannula   Liters per Minute 10   Frequency Continuous (stationary and portable oxygen unit needed)   Oxygen conserving device Yes   Oxygen delivery system Gas      09/09/19 0848          Follow-up Information    Marrian Salvage, [name]. Schedule an appointment as soon as possible for a visit in 1 week(s).   Specialty: Internal Medicine Why: Follow with your pulmonary physician within a week Contact information: [place] [address] [phone]           Major procedures and Radiology Reports - PLEASE review detailed and final reports thoroughly  -      DG Chest 1 View  Result Date: 08/29/2019 CLINICAL DATA:  Pulmonary fibrosis EXAM: CHEST  1 VIEW COMPARISON:  08/21/2019, 08/22/2019 FINDINGS: Single frontal view of the chest again demonstrates basilar predominant pulmonary fibrosis. No new airspace disease, effusion, or pneumothorax.  The cardiac silhouette is unremarkable. IMPRESSION: 1. Continued bibasilar pulmonary fibrosis. No acute airspace disease. Electronically Signed   By: Randa Ngo M.D.   On: 08/29/2019 21:10   CT ANGIO CHEST PE W OR WO CONTRAST  Result Date: 08/22/2019 CLINICAL DATA:  Shortness of breath, history of pulmonary fibrosis EXAM: CT ANGIOGRAPHY CHEST WITH CONTRAST TECHNIQUE: Multidetector CT imaging of the chest was performed using the standard protocol during bolus administration of intravenous contrast. Multiplanar CT image reconstructions and MIPs were obtained to evaluate the vascular anatomy. CONTRAST:  15m OMNIPAQUE IOHEXOL 350 MG/ML SOLN COMPARISON:  07/20/2019 FINDINGS: Cardiovascular:  Satisfactory opacification the bilateral pulmonary arteries to the segmental level. No evidence of pulmonary embolism. Enlargement of the pulmonary arteries, suggesting pulmonary arterial hypertension. No evidence of thoracic aortic aneurysm or dissection. Atherosclerotic calcifications of the aortic arch. The heart is top-normal in size.  No pericardial effusion. Mediastinum/Nodes: Small mediastinal lymph nodes, including a dominant 12 mm short axis subcarinal node (series 5/image 77, previously 11 mm. Bilateral hilar/perihilar lymphadenopathy, measuring up to 13 mm short axis on the right, previously 12 mm. This appearance is favored to be reactive. Visualized right thyroid is mildly enlarged/nodular. Lungs/Pleura: Residual post infectious/inflammatory scarring in the left upper lobe (series 7/image 38). Multifocal patchy/ground-glass opacities with subpleural reticulation in the lungs bilaterally, lower lobe predominant, compatible with pulmonary fibrosis. Trace loculated pleural fluid along the left major fissure, minimally increased from the prior. No suspicious pulmonary nodules. No pneumothorax. Upper Abdomen: Visualized upper abdomen is notable for a dominant anterior right upper pole renal cyst measuring at least 6.5 cm, incompletely visualized. Musculoskeletal: Visualized osseous structures are within normal limits. Review of the MIP images confirms the above findings. IMPRESSION: No evidence of pulmonary embolism. Pulmonary fibrosis, unchanged.  Suspected reactive lymphadenopathy. Prior left upper lobe pneumonia has resolved, with mild residual post infectious/inflammatory scarring. Suspected pulmonary arterial hypertension. Aortic Atherosclerosis (ICD10-I70.0). Electronically Signed   By: SJulian HyM.D.   On: 08/22/2019 01:10   DG Chest Port 1 View  Result Date: 09/07/2019 CLINICAL DATA:  Respiratory distress, COVID positive EXAM: PORTABLE CHEST 1 VIEW COMPARISON:  08/29/2019 FINDINGS:  Airspace disease bilaterally, unchanged. Heart is normal size. No effusions or pneumothorax. No acute bony abnormality. IMPRESSION: Stable bilateral airspace disease, likely related to pulmonary fibrosis. No change. Electronically Signed   By: KRolm BaptiseM.D.   On: 09/07/2019 08:49   DG Chest Port 1 View  Result Date: 08/21/2019 CLINICAL DATA:  Hypoxia, COVID-19. EXAM: PORTABLE CHEST 1 VIEW COMPARISON:  Chest x-rays dated 08/19/2019 and 07/20/2019 FINDINGS: Interstitial lung opacities are again seen bilaterally, RIGHT slightly greater than LEFT, most likely related to chronic interstitial lung disease/fibrosis. The LEFT upper lobe is now clear, compatible with resolution of the previously demonstrated LEFT upper lobe pneumonia. No evidence of a new pneumonia. No pleural effusion or pneumothorax is seen. Stable cardiomegaly. IMPRESSION: 1. Resolution of the previous LEFT upper lobe pneumonia. No new lung findings. No evidence of a new pneumonia. 2. Stable interstitial lung opacities bilaterally, most likely chronic interstitial lung disease/fibrosis. 3. Stable cardiomegaly. Electronically Signed   By: SFranki CabotM.D.   On: 08/21/2019 13:22   DG Chest Port 1 View  Result Date: 08/19/2019 CLINICAL DATA:  Shortness of breath. EXAM: PORTABLE CHEST 1 VIEW COMPARISON:  July 20, 2019. FINDINGS: Stable cardiomegaly. No pneumothorax or pleural effusion is noted. Stable bilateral interstitial lung opacities are noted most consistent with pulmonary fibrosis or scarring. Left upper lobe pneumonia on prior exam has  resolved. Bony thorax is unremarkable. No definite acute abnormality is noted. IMPRESSION: Stable bilateral interstitial lung opacities are noted most consistent with pulmonary fibrosis or scarring. Electronically Signed   By: Marijo Conception M.D.   On: 08/19/2019 14:42   VAS [place] LOWER EXTREMITY VENOUS (DVT)  Result Date: 08/27/2019  Lower Venous DVTStudy Indications: Swelling.  Risk Factors: COVID  19 positive. Comparison Study: No prior studies. Performing Technologist: Oliver Hum RVT  Examination Guidelines: A complete evaluation includes B-mode imaging, spectral Doppler, color Doppler, and power Doppler as needed of all accessible portions of each vessel. Bilateral testing is considered an integral part of a complete examination. Limited examinations for reoccurring indications may be performed as noted. The reflux portion of the exam is performed with the patient in reverse Trendelenburg.  +---------+---------------+---------+-----------+----------+--------------+ RIGHT    CompressibilityPhasicitySpontaneityPropertiesThrombus Aging +---------+---------------+---------+-----------+----------+--------------+ CFV      Full           Yes      Yes                                 +---------+---------------+---------+-----------+----------+--------------+ SFJ      Full                                                        +---------+---------------+---------+-----------+----------+--------------+ FV Prox  Full                                                        +---------+---------------+---------+-----------+----------+--------------+ FV Mid   Full                                                        +---------+---------------+---------+-----------+----------+--------------+ FV DistalFull                                                        +---------+---------------+---------+-----------+----------+--------------+ PFV      Full                                                        +---------+---------------+---------+-----------+----------+--------------+ POP      None           No       No                   Acute          +---------+---------------+---------+-----------+----------+--------------+ PTV      Full                                                         +---------+---------------+---------+-----------+----------+--------------+  PERO     Partial                                      Acute          +---------+---------------+---------+-----------+----------+--------------+ Gastroc  None                                         Acute          +---------+---------------+---------+-----------+----------+--------------+   +---------+---------------+---------+-----------+----------+--------------+ LEFT     CompressibilityPhasicitySpontaneityPropertiesThrombus Aging +---------+---------------+---------+-----------+----------+--------------+ CFV      Full           Yes      Yes                                 +---------+---------------+---------+-----------+----------+--------------+ SFJ      Full                                                        +---------+---------------+---------+-----------+----------+--------------+ FV Prox  Full                                                        +---------+---------------+---------+-----------+----------+--------------+ FV Mid   Full                                                        +---------+---------------+---------+-----------+----------+--------------+ FV DistalFull                                                        +---------+---------------+---------+-----------+----------+--------------+ PFV      Full                                                        +---------+---------------+---------+-----------+----------+--------------+ POP      Full           Yes      Yes                                 +---------+---------------+---------+-----------+----------+--------------+ PTV      Full                                                        +---------+---------------+---------+-----------+----------+--------------+  PERO     Full                                                         +---------+---------------+---------+-----------+----------+--------------+     Summary: RIGHT: - Findings consistent with acute deep vein thrombosis involving the right popliteal vein, right peroneal veins, and right gastrocnemius veins. - No cystic structure found in the popliteal fossa.  LEFT: - There is no evidence of deep vein thrombosis in the lower extremity.  - No cystic structure found in the popliteal fossa.  *See table(s) above for measurements and observations. Electronically signed by Deitra Mayo MD on 08/27/2019 at 1:57:20 PM.    Final     Micro Results    No results found for this or any previous visit (from the past 240 hour(s)).  Today   Subjective    Willeen Novak today has no headache,no chest abdominal pain,no new weakness tingling or numbness, feels much better wants to go home today.    Objective   Blood pressure 105/65, pulse 82, temperature 98.4 F (36.9 C), resp. rate 18, height 5' 6" (1.676 m), weight 71.9 kg, SpO2 97 %.  No intake or output data in the 24 hours ending 09/10/19 0906  Exam  Awake Alert,  No new F.N deficits, Normal affect Jen[place].AT,PERRAL Supple Neck,No JVD, No cervical lymphadenopathy appriciated.  Symmetrical Chest wall movement, Good air movement bilaterally, CTAB RRR,No Gallops,Rubs or new Murmurs, No Parasternal Heave +ve B.Sounds, Abd Soft, Non tender, No organomegaly appriciated, No rebound -guarding or rigidity. No Cyanosis, Clubbing or edema, No new Rash or bruise   Data Review   CBC w Diff:  Lab Results  Component Value Date   WBC 7.1 09/10/2019   HGB 10.9 (L) 09/10/2019   HCT 35.9 (L) 09/10/2019   PLT 301 09/10/2019   LYMPHOPCT 20 09/10/2019   MONOPCT 8 09/10/2019   EOSPCT 5 09/10/2019   BASOPCT 1 09/10/2019    CMP:  Lab Results  Component Value Date   NA 140 09/10/2019   K 4.4 09/10/2019   CL 101 09/10/2019   CO2 28 09/10/2019   BUN <5 (L) 09/10/2019   CREATININE 0.69 09/10/2019   PROT 5.9 (L) 09/10/2019     ALBUMIN 2.7 (L) 09/10/2019   BILITOT 0.6 09/10/2019   ALKPHOS 77 09/10/2019   AST 26 09/10/2019   ALT 16 09/10/2019  .   Total Time in preparing paper work, data evaluation and todays exam - 63 minutes  Lala Lund M.D on 09/10/2019 at 9:06 AM  [facility]   Office  [phone]

## 2019-09-10 NOTE — Care Management Important Message (Signed)
Important Message  Patient Details  Name: Niah Heinle MRN: [id number] Date of Birth: [birth year]   Medicare Important Message Given:  Yes - Important Message mailed due to current National Emergency  Verbal consent obtained due to current National Emergency  Relationship to patient: Child Contact Name: Camila Li Call Date: 09/10/19  Time: 7005 Phone: [phone] Outcome: Spoke with contact Important Message mailed to: Patient address on file    Delorse Lek 09/10/2019, 10:47 AM

## 2019-09-10 NOTE — Telephone Encounter (Signed)
I would actually prefer that they check with her pulmonologist. I haven't seen the patient since October 2019.

## 2019-09-10 NOTE — Discharge Instructions (Signed)
Use oxygen 8 to 10 L as needed to keep yourself comfortable, you can go up to 15 L if needed.    Follow with Primary MD Marrian Salvage, [name] and your Pulmonary MD in 7 days   Get CBC, CMP, 2 view Chest X ray -  checked next visit within 1 week by Primary MD    Activity: As tolerated with Full fall precautions use walker/cane & assistance as needed  Disposition Home   Diet: Heart Healthy   Special Instructions: If you have smoked or chewed Tobacco  in the last 2 yrs please stop smoking, stop any regular Alcohol  and or any Recreational drug use.  On your next visit with your primary care physician please Get Medicines reviewed and adjusted.  Please request your Prim.MD to go over all Hospital Tests and Procedure/Radiological results at the follow up, please get all Hospital records sent to your Prim MD by signing hospital release before you go home.  If you experience worsening of your admission symptoms, develop shortness of breath, life threatening emergency, suicidal or homicidal thoughts you must seek medical attention immediately by calling 911 or calling your MD immediately  if symptoms less severe.  You Must read complete instructions/literature along with all the possible adverse reactions/side effects for all the Medicines you take and that have been prescribed to you. Take any new Medicines after you have completely understood and accpet all the possible adverse reactions/side effects.         Information on my medicine - ELIQUIS (apixaban)  This medication education was reviewed with me or my healthcare representative as part of my discharge preparation.  The pharmacist that spoke with me during my hospital stay was:   Why was Eliquis prescribed for you? Eliquis was prescribed to treat blood clots that may have been found in the veins of your legs (deep vein thrombosis) or in your lungs (pulmonary embolism) and to reduce the risk of them occurring again.  What do  You need to know about Eliquis ? The starting dose is 10 mg (two 5 mg tablets) taken TWICE daily for the FIRST SEVEN (7) DAYS, then on 3/4  the dose is reduced to ONE 5 mg tablet taken TWICE daily.  Eliquis may be taken with or without food.   Try to take the dose about the same time in the morning and in the evening. If you have difficulty swallowing the tablet whole please discuss with your pharmacist how to take the medication safely.  Take Eliquis exactly as prescribed and DO NOT stop taking Eliquis without talking to the doctor who prescribed the medication.  Stopping may increase your risk of developing a new blood clot.  Refill your prescription before you run out.  After discharge, you should have regular check-up appointments with your healthcare provider that is prescribing your Eliquis.    What do you do if you miss a dose? If a dose of ELIQUIS is not taken at the scheduled time, take it as soon as possible on the same day and twice-daily administration should be resumed. The dose should not be doubled to make up for a missed dose.  Important Safety Information A possible side effect of Eliquis is bleeding. You should call your healthcare provider right away if you experience any of the following: ? Bleeding from an injury or your nose that does not stop. ? Unusual colored urine (red or dark brown) or unusual colored stools (red or black). ? Unusual bruising  for unknown reasons. ? A serious fall or if you hit your head (even if there is no bleeding).  Some medicines may interact with Eliquis and might increase your risk of bleeding or clotting while on Eliquis. To help avoid this, consult your healthcare provider or pharmacist prior to using any new prescription or non-prescription medications, including herbals, vitamins, non-steroidal anti-inflammatory drugs (NSAIDs) and supplements.  This website has more information on Eliquis (apixaban):  http://www.eliquis.com/eliquis/home

## 2019-09-10 NOTE — Progress Notes (Signed)
Occupational Therapy Treatment Patient Details Name: Stacy [name] MRN: [id number] DOB: 08/11/1955 Today's Date: 09/10/2019    History of present illness 64 year old with history of GERD, pulmonary fibrosis with chronic hypoxia on 4 L nasal cannula at home on Imuran and pirfenidone presented to the ED with shortness of breath on 2/11 found to have COVID-19 pneumonia.  CTA chest showed pulmonary fibrosis with reactive lymphadenopathy but no evidence of pulmonary embolism.  Completed course of remdesivir 2/17 and received convalescent plasma 2/15.  Not a candidate for Actemra as patient was on Imuran as outpatient.   OT comments  Pt with planned DC home with hospice today. Time spent educating and reinforcing energy conservation strategies during tasks at home with pt receptive of information. Provided energy conservation handout for reference. Pt completed oral care with setup sitting upright in bed. During 3-5 minute task, pt desat to 88% on 10 L HFNC, provided cues for breathing techniques with pt increasing to 90-92%. Pt appeared more calm and able to independently verbalize strategies to maintain calm breathing, including use of flutter valve.    Follow Up Recommendations  Home health OT;Supervision/Assistance - 24 hour;Other (comment)    Equipment Recommendations  3 in 1 bedside commode    Recommendations for Other Services      Precautions / Restrictions Precautions Precautions: Fall;Other (comment) Precaution Comments: desat w/ min activity, highly anxious  Restrictions Weight Bearing Restrictions: No       Mobility Bed Mobility                  Transfers                      Balance                                           ADL either performed or assessed with clinical judgement   ADL Overall ADL's : Needs assistance/impaired     Grooming: Oral care;Set up;Sitting;Bed level Grooming Details (indicate cue type and reason): Pt setup  for oral care in sitting position bed level. Pt desat to 88% on 10 L HFNC during oral care - cued for breathing techniques and pt able to increase to 90-92%                               General ADL Comments: Reinforced and educated on energy conservation strategies to implement when home to manage breathing and maximize/maintain function with pt receptive of strategies     Vision       Perception     Praxis      Cognition Arousal/Alertness: Awake/alert Behavior During Therapy: WFL for tasks assessed/performed Overall Cognitive Status: Within Functional Limits for tasks assessed                                          Exercises     Shoulder Instructions       General Comments      Pertinent Vitals/ Pain       Pain Assessment: No/denies pain  Home Living  Prior Functioning/Environment              Frequency  Min 3X/week        Progress Toward Goals  OT Goals(current goals can now be found in the care plan section)  Progress towards OT goals: Progressing toward goals  Acute Rehab OT Goals Patient Stated Goal: go home OT Goal Formulation: With patient Time For Goal Achievement: 09/21/19 Potential to Achieve Goals: Good ADL Goals Pt Will Perform Grooming: with modified independence;standing Pt Will Perform Upper Body Bathing: with modified independence;sitting Pt Will Perform Lower Body Bathing: with modified independence;sit to/from stand Pt Will Transfer to Toilet: with modified independence;ambulating Additional ADL Goal #1: Patient will maintain SpO2>90 during functional mobility with ADLs.  Plan Discharge plan remains appropriate    Co-evaluation          OT goals addressed during session: ADL's and self-care      AM-PAC OT "6 Clicks" Daily Activity     Outcome Measure   Help from another person eating meals?: None Help from another person taking  care of personal grooming?: A Little Help from another person toileting, which includes using toliet, bedpan, or urinal?: A Little Help from another person bathing (including washing, rinsing, drying)?: A Little Help from another person to put on and taking off regular upper body clothing?: A Little Help from another person to put on and taking off regular lower body clothing?: A Little 6 Click Score: 19    End of Session Equipment Utilized During Treatment: Oxygen  OT Visit Diagnosis: Unsteadiness on feet (R26.81);Muscle weakness (generalized) (M62.81)   Activity Tolerance Patient tolerated treatment well   Patient Left in bed;with call bell/phone within reach;with bed alarm set   Nurse Communication          Time: 7622-6333 OT Time Calculation (min): 15 min  Charges: OT General Charges $OT Visit: 1 Visit OT Treatments $Self Care/Home Management : 8-22 mins  Layla Maw, OTR/L   Layla Maw 09/10/2019, 1:12 PM

## 2019-09-14 ENCOUNTER — Telehealth: Payer: Self-pay | Admitting: Pulmonary Disease

## 2019-09-14 NOTE — Telephone Encounter (Signed)
Closing encounter as there was another encounter sent with more info on it for Dr. Vaughan Browner.

## 2019-09-14 NOTE — Telephone Encounter (Signed)
Called and spoke with Izora Gala letting her know that Dr. Vaughan Browner will be back in the office until tomorrow.  Izora Gala is also asking about med reductions on Esbriet, Tussionex, Eliquis, azathioprine, omeprazole as these meds are not on their formulary list.  Izora Gala stated when they went to do a visit with pt, pt was on 15L and she was satting in the 80s on her 15L. Izora Gala stated that pt's daughter said it does take pt a while to recover and pt also tries to turn O2 down once pt catches her breath but pt was still satting in the 80s.  Dr. Vaughan Browner please advise on all this. Thanks!

## 2019-09-16 NOTE — Telephone Encounter (Signed)
Yes. I can be the attending doctor

## 2019-09-16 NOTE — Telephone Encounter (Addendum)
Called and spoke with Izora Gala relaying the info to her from Dr. Vaughan Browner. While speaking with Izora Gala, she wanted to know if Dr. Vaughan Browner would agree to be the attending for pt. They do have a hospice doctor there that would help coordinate all with meds, etc but they just want to know if Dr. Vaughan Browner would be okay to be the attending since he is the one who knows pt.  Dr. Vaughan Browner please advise.

## 2019-09-16 NOTE — Telephone Encounter (Signed)
Called and spoke with Stacy [name] letting her know that Dr. Vaughan Browner stated he could be the attending physician and she verbalized understanding. Nothing further needed.

## 2019-09-16 NOTE — Telephone Encounter (Signed)
Can stop Esbriet and azathioprine while she is on hospice.  Use formulary medications for Tussionex and omeprazole.  She will need to continue on some kind of anticoagulation.    Do they have a hospice doctor who can coordinate all this?

## 2019-09-20 ENCOUNTER — Telehealth: Payer: Self-pay | Admitting: Pulmonary Disease

## 2019-09-20 NOTE — Telephone Encounter (Signed)
Spoke with patient. She stated that she had a home visit this morning with hospice. She was confused because Hospice is taking her off of the Esbriet and Imuran. She never received a definite answer as to why they were stopping this. I advised her that Izora Gala had called our office on 09/14/19 to say that Hospice would not cover a lot of her medications, including the Esbriet and Imuran and Dr. Vaughan Browner wanted them to replace the Eliquis with another anticoagulation therapy plan.   As of the visit this morning, they have not come up with a new plan.   She is upset because she still wants to use her insurance, which pays for these medications. She stated that if she can not continue to use her insurance, she wants to D/C hospice.   I left a message for Izora Gala to follow up on this.   Dr. Vaughan Browner, please advise. Thanks!

## 2019-09-21 NOTE — Telephone Encounter (Signed)
Called and spoke with Patient.  Offered OV or tele visit with Dr. Vaughan Browner to review and discuss things with Patient.  Patient stated she would like to discontinue hospice.  Patient stated she is not happy with hospice, and stated hospice has discontinued medications she feels she needs. Patient stated hospice has taken over her O2, but [facility] stated she can return back to [facility], if Dr. Vaughan Browner sends letter of necessity to [place].  Message routed to Dr. Concepcion Living to advise

## 2019-09-21 NOTE — Telephone Encounter (Signed)
I am not sure if she understood what hospice meant when it was discussed with her at last admission. Unfortunately we were not involved in those discussions.  Please have the palliative care team discuss and clarify with her. I am am happy to see her in clinic at next available to speak with her.

## 2019-09-23 NOTE — Telephone Encounter (Signed)
OK to take her off hospice and resume her regular meds per her preference. See if youc an reorder O2 through [facility] and arrange office visit. Thnaks

## 2019-09-23 NOTE — Telephone Encounter (Signed)
Called and spoke with pt letting her know that Dr. Vaughan Browner said we could d/c hospice if this is her preference and pt stated she would like that. Also stated to her that Dr. Vaughan Browner said to arrange a visit to further discuss things and she verbalized understanding. Televisit has been scheduled for pt with [facility] 3/19 at 2:30.

## 2019-09-24 ENCOUNTER — Other Ambulatory Visit: Payer: Self-pay

## 2019-09-24 ENCOUNTER — Encounter: Payer: Self-pay | Admitting: Primary Care

## 2019-09-24 ENCOUNTER — Other Ambulatory Visit: Payer: Self-pay | Admitting: Pulmonary Disease

## 2019-09-24 ENCOUNTER — Ambulatory Visit (INDEPENDENT_AMBULATORY_CARE_PROVIDER_SITE_OTHER): Payer: Medicare Other | Admitting: Primary Care

## 2019-09-24 ENCOUNTER — Telehealth: Payer: Self-pay | Admitting: Primary Care

## 2019-09-24 DIAGNOSIS — Z9981 Dependence on supplemental oxygen: Secondary | ICD-10-CM

## 2019-09-24 DIAGNOSIS — J9610 Chronic respiratory failure, unspecified whether with hypoxia or hypercapnia: Secondary | ICD-10-CM

## 2019-09-24 DIAGNOSIS — J9611 Chronic respiratory failure with hypoxia: Secondary | ICD-10-CM

## 2019-09-24 DIAGNOSIS — R630 Anorexia: Secondary | ICD-10-CM

## 2019-09-24 DIAGNOSIS — J84112 Idiopathic pulmonary fibrosis: Secondary | ICD-10-CM

## 2019-09-24 DIAGNOSIS — J810 Acute pulmonary edema: Secondary | ICD-10-CM | POA: Diagnosis not present

## 2019-09-24 DIAGNOSIS — J849 Interstitial pulmonary disease, unspecified: Secondary | ICD-10-CM

## 2019-09-24 DIAGNOSIS — Z8616 Personal history of COVID-19: Secondary | ICD-10-CM

## 2019-09-24 MED ORDER — OMEPRAZOLE 20 MG PO CPDR: 20.0000 mg | DELAYED_RELEASE_CAPSULE | Freq: Every day | ORAL | 3 refills | Status: AC

## 2019-09-24 MED ORDER — PREDNISONE 5 MG PO TABS: 5.0000 mg | ORAL_TABLET | Freq: Every day | ORAL | 1 refills | Status: DC

## 2019-09-24 MED ORDER — APIXABAN 5 MG PO TABS: 5.0000 mg | ORAL_TABLET | Freq: Two times a day (BID) | ORAL | 2 refills | Status: DC

## 2019-09-24 MED ORDER — ALBUTEROL SULFATE HFA 108 (90 BASE) MCG/ACT IN AERS: 2.0000 | INHALATION_SPRAY | Freq: Four times a day (QID) | RESPIRATORY_TRACT | 0 refills | Status: AC | PRN

## 2019-09-24 MED ORDER — ESBRIET 267 MG PO TABS: 801.0000 mg | ORAL_TABLET | Freq: Three times a day (TID) | ORAL | 1 refills | Status: DC

## 2019-09-24 NOTE — Patient Instructions (Addendum)
Palliative care- comfort with/or without curative intent Hospice - comfort care ONLY, as opposed to curative care, for people with a terminal illness. Palliative care can begin at any time or stage of illness    Referral: - Palliative care RE: ILD/chronic respiratory failure  - Home health eval/treat; needs assistance with ADLs  - [facility] oxygen renewal, needs to be able to accommodate 15L   Follow-up: - 4 weeks/next available with Dr. Vaughan Browner    End-of-Life Care End-of-life care is the physical, emotional, mental, and spiritual care a person receives during the last days, weeks, or months of life. Care at the end of a person's life requires a team of professionals, which may include:  Health care providers.  A Education officer, museum.  A spiritual adviser. The goal of end-of-life care is to give the patient the highest quality of life possible at the end of life. What are the different types of end-of-life care? There are different options for receiving care at the end of your life. Palliative care This type of care can be delivered at the same time as other treatments. The goal is to manage your symptoms and improve your quality of life, which may include:  Control of pain and other symptoms.  Family support.  Spiritual support.  Emotional and social support.  Comfort. You may need palliative care for months or years to manage a long-term (chronic) disease or condition. Hospice care This is a kind of end-of-life care that may be recommended by your health care providers. Hospice care is usually offered when a person is expected to live for six months or less. Hospice care is designed to provide people who are terminally ill and their families with medical, spiritual, and psychological support. The aim is to improve your quality of life by keeping you as comfortable as possible in the final stages of life. Comfort care This type of care is designed to help meet your basic needs and  maintain your overall comfort at the end of your life. This includes:  Caring for your skin.  Making sure that you are breathing well.  Ensuring that you are eating well.  Making sure that you get enough rest.  Making sure that you are at a comfortable body temperature. A plan for comfort care can also address the mental, emotional, and spiritual issues that may come up at the end of your life. Where does end-of-life care take place? End-of-life care can take place wherever you are living, as long as you get the care you need. End-of-life care can happen:  At your home.  In a nursing home.  In a hospital. You and your loved ones may be able to decide where end-of-life care takes place. This decision depends on:  Your wishes.  Your comfort.  The medical equipment you need. How do I know when it is time for end-of-life care? Your health care provider may tell you that treatments can no longer control your illness. You may also decide that you do not want to undergo the treatments that are available. Talk to your health care provider and your loved ones about your end-of-life care options. If possible, discuss the following topics with your health care provider before you need end-of-life care:  How much medical treatment you want during end-of-life care.  Where you would like to live during end-of-life care.  What kinds of treatments you would like to keep you comfortable.  Which treatments you would refuse.  Your faith or spiritual needs at  the end of your life.  Who will handle practical details, such as your will, finances, and funeral planning. You can create legal documents (advance directives) to let your loved ones know your wishes for end-of-life care. Talk to your health care provider or a lawyer about making a living will that explains your medical wishes. You can also have a medical power of attorney. This designates a person to make health decisions for you if you  cannot make them yourself. Summary  End-of-life care is the physical, emotional, mental, and spiritual care a person receives during the last days, weeks, or months of life.  The goal of end-of-life care is to give the patient the highest quality of life possible at the end of life.  There are a number of different options for receiving this care, including palliative care, hospice care, or comfort care.  Talk to your health care provider and your loved ones about your preferences for end-of-life care. This includes the place to receive care, the kind of care you want to receive, the care you want to decline, your spiritual needs, and your finances. This information is not intended to replace advice given to you by your health care provider. Make sure you discuss any questions you have with your health care provider. Document Revised: 07/08/2017 Document Reviewed: 07/08/2017 Elsevier Patient Education  2020 [employer].

## 2019-09-24 NOTE — Progress Notes (Signed)
Virtual Visit via Telephone Note  I connected with Stacy [name] on 09/24/19 at  2:30 PM EDT by telephone and verified that I am speaking with the correct person using two identifiers.  Location: Patient: Home Provider: Office   I discussed the limitations, risks, security and privacy concerns of performing an evaluation and management service by telephone and the availability of in person appointments. I also discussed with the patient that there may be a patient responsible charge related to this service. The patient expressed understanding and agreed to proceed.   History of Present Illness: 64 year old female, former smoker. PMH significant for pulmonary fibrosis, chronic respiratory failure O2 dependent, acute DVT. Patient of Dr. Vaughan Browner, last seen for video visit by pulmonary NP on 08/20/19.  Recent Hospitalization in February for acute hypoxic respiratory failure d/t Covid-19 viral pneumonitis. She originally tested positive for COVID on February 11th and was admitted to [facility] via [facility] ED on February 13th. Received remdesivir, decadron and convalescent plasma. She was discharged on hospice and 10L oxygen. Arranged for ability to go up to 15L oxygen as needed. Recommended closely monitor and   09/24/2019  Patient contacted today for televisit to discuss pallative versus hospice. Since she was hospitalized she has been staying at her daughters house. She has been using 12L O2 at rest; and 15L on exertion. She has a dual oxygen machine. Breathing is better, able move around some. Using rolling walker. States that she is still weak and has a very poor appetite. She would like to resume medications and discontinue hospice.    Observations/Objective:  - Patient reports: O2 93% on 12L  - Able to speak in full sentences, no significant shortness of breath/wheezing   Assessment and Plan:  ILD: - Resume Esbriet 856m TID - Patient would like to continue with curative treatment and medication  for her fibrosis - Discontinuing Hospice and referring to palliative care   Chronic respiratory failure: - Hospitalized for Covid-19 pneumonitis in mid-February 202. Discharged on 10-15L oxygen  - Renew oxygen with [facility], ability to go up to 15L oxygen   Decreased appetite: - Will discuss with Dr. MVaughan Brownerabout starting appetite stimulant; caution d/t recent DVT  - For now recommend boost/ensure three time a day   Follow Up Instructions:   - Follow-up in 4 weeks/next available with Dr. MVaughan Browner I discussed the assessment and treatment plan with the patient. The patient was provided an opportunity to ask questions and all were answered. The patient agreed with the plan and demonstrated an understanding of the instructions.   The patient was advised to call back or seek an in-person evaluation if the symptoms worsen or if the condition fails to improve as anticipated.  I provided 28 minutes of non-face-to-face time during this encounter.   EMartyn Ehrich NP

## 2019-09-24 NOTE — Telephone Encounter (Signed)
Spoke with patient today. Went over difference between palliative care and hospice. She would like to continue curative treatment and medications. She is on board with palliative care. She is doing ok, breathing is some better. She is on 12-15L oxygen.   She reports poor appetite, would you be ok with starting her on megace or marinol as an appetite stimulant? She did have an acute DVT and is on Eliquis.   D/c hospice, referring to hospice and home health. Renewing medications and oxygen with [facility]. FU in 4 weeks/next available with you

## 2019-09-27 ENCOUNTER — Telehealth: Payer: Self-pay | Admitting: Pulmonary Disease

## 2019-09-27 NOTE — Telephone Encounter (Signed)
[name] at [facility] called would like information about who the pt is being referred out to. Please inform when possible.   [phone]

## 2019-09-27 NOTE — Telephone Encounter (Signed)
Per chart pt was referred for ILD and chronic respiratory failure.  Per chart it looks like this referral is for comfort/palliative care only.  [name] aware.  Nothing further needed at this time- will close encounter.

## 2019-09-27 NOTE — Telephone Encounter (Signed)
Called pt, and daughter (dpr on file) answered.  Daughter stated that this was not a good time and asked [place] to call back at a later time.  Wcb.

## 2019-09-28 ENCOUNTER — Telehealth: Payer: Self-pay | Admitting: Pulmonary Disease

## 2019-09-28 ENCOUNTER — Telehealth: Payer: Self-pay | Admitting: Internal Medicine

## 2019-09-28 DIAGNOSIS — J849 Interstitial pulmonary disease, unspecified: Secondary | ICD-10-CM

## 2019-09-28 DIAGNOSIS — J9611 Chronic respiratory failure with hypoxia: Secondary | ICD-10-CM

## 2019-09-28 NOTE — Telephone Encounter (Signed)
Stacy [name] from [facility], Orders placed not in the correct format.  Please advise.  [phone]

## 2019-09-28 NOTE — Telephone Encounter (Signed)
Scheduled [facility] Palliative visit for 10-04-19 at 1:00.

## 2019-09-28 NOTE — Telephone Encounter (Signed)
Spoke with Estill Bamberg from [facility]  She was calling about order for o2  Needs to be on the correct template  I have corrected this   While on the phone with her I asked if they could accommodate the shower chair, hospital bed, ect mentioned below and she says they do not have these there  Will send that order to another DME  Order placed  Pt aware

## 2019-09-28 NOTE — Telephone Encounter (Signed)
Yes. That will be fine to place those orders. Thanks

## 2019-09-28 NOTE — Telephone Encounter (Signed)
Spoke with the pt  She states that hospice is discontinuing services and coming to pick up some of her equipment they had loaned her She wants to know if Dr Vaughan Browner can send order to [facility] for shower chair, bedside commode, hospital bed, wheelchair and shower chair  Please advise, thanks!

## 2019-09-30 ENCOUNTER — Telehealth: Payer: Self-pay | Admitting: Pulmonary Disease

## 2019-09-30 DIAGNOSIS — J45909 Unspecified asthma, uncomplicated: Secondary | ICD-10-CM | POA: Diagnosis not present

## 2019-09-30 DIAGNOSIS — J0111 Acute recurrent frontal sinusitis: Secondary | ICD-10-CM

## 2019-09-30 DIAGNOSIS — J449 Chronic obstructive pulmonary disease, unspecified: Secondary | ICD-10-CM | POA: Diagnosis not present

## 2019-09-30 NOTE — Telephone Encounter (Signed)
I called and spoke with Stacy [name] and she states that when she left the hospital she went with equipment and now that she is in Hospice care they are wanting to take it away. She was asking for a verbal order to go out an evaluate her for PT and OT to see if she qualifies and to not get her equipment taken aware. I gave a verbal approval for this.

## 2019-09-30 NOTE — Telephone Encounter (Signed)
Left message for patient to call back.

## 2019-10-01 MED ORDER — MEGESTROL ACETATE 400 MG/10ML PO SUSP: 400.0000 mg | Freq: Every day | ORAL | 3 refills | Status: AC

## 2019-10-01 NOTE — Telephone Encounter (Signed)
Spoke with the Stacy [name]  She is asking for something to help with her appetite  She states that Dr Vaughan Browner had discussed this with her before but nothing was sent to the pharmacy  Please advise, thanks!

## 2019-10-01 NOTE — Telephone Encounter (Signed)
I sent in an order for megace

## 2019-10-03 NOTE — Progress Notes (Signed)
March 29th, 2021 [facility] Palliative Care Consult Note Telephone: [phone]  Fax: [phone]  PATIENT NAME: Stacy [name] DOB: 08/11/1955 MRN: [id number] [place]. [place] [phone] Pharmacy[facility]   PRIMARY CARE PROVIDER:   Marrian Salvage, [name]/Dr. [place] [address]  Services: [facility].  [facility]formerly [place] provides RN Adonis Brook [phone]) PT/OT. Arranging for DME supplies to substitute for those supplied by [facility] (hospital bed, BSC, shower chair).  REFERRING PROVIDER:  Geraldo Pitter  NP Pulmonary Disease  RESPONSIBLE PARTY: Camila Li (Daughter) [phone] (Mobile)  ASSESSMENT / RECOMMENDATIONS:  1. Advance Care Planning: A. Directives: DNR form completed today, left with family, and uploaded into [facility] EMR. B. Goals of Care: discussed setting small goals for the day; enjoying the moments with family. Keeping a notebook that she can jot down the happy times in her day, to refer back to when feeling blue. Discussed her important role in the family despite her limited mobility d/t dyspnea. She serves as a huge source of moral support to her dtr. She is grateful for having survived COVID, but grieves her inability to go out and drive her car. She has seen some improvement in function since d/c from hospital.  2. Symptom Management:  A. Dyspnea after minimal exertion such as with conversation, or going to her bedside commode. Maintains oxygen flow at 12 LPM rest, 15 LPM activity. Sats 95% at rest, desaturations to the 70's-8""s with min. exertion. She does utilize pursed lip breathing. She has 2 oxygen concentrators that combine to provide total liter flow. Has nasal congestion thought 2/2 dry oxygen; has bottle humidifiers on concentrators. Pulmonary has prescribed saline nasal spray.  -discussed use of prn Lorazepam for severe  dyspnea. She has available. Also talked about bathing her face with some cool water and blowing air onto her face with a fan.  -when [facility] visits tomorrow, patient will ask her about ordering a face mask from [place].   B. Decreased appetite. Megace recently ordered. Current weight 158lbs. At a height of 5'6" her BMI is 25.6kg/m2.  C. Mobility: rollator walker with seat within home. Homebound d/t high flow oxygen requirements and dyspnea. She is awaiting delivery of a wheelchair. Needs assistance with dressing and bathing. Family prepares all her meals. She can feed herself.   D. Pain: band like across lower chest, exacerbated with deep breath. Takes 9 of her 641m Tylenol/day. Has Tramadol and oxycodone available but hasn't taken. We discussed limiting her Tylenol to 5 tabs/d (to keep her at 32514mday).  -we made out a schedule of Tylenol 65064mx/d. Tramadol 50 bid, and Ibuprofen 200m54md. Discussed able to take oxycodone 5mg 55m severe pain, and to take a senna with this for possible opioid induced constipation (has a tendency towards constipation).   3. Family Supports: Currently staying in her daughter Carla's home; working out well. Patient has 2 daughters and 1 son. Gets great enjoyment with visits from her great granddaughter. There's a new puppy in the house.  4. Palliative care f/u: Thurs 11/11/2019 @ noon I spent 60 minutes providing this consultation from 1pm to 2pm. More than 50% of the time in this consultation was spent coordinating communication.   HISTORY OF PRESENT ILLNESS:  CarriDenetra Formoso 63 y.70 female with h/o Pulmonary Fibrosis, chronic respiratory failure (O2 at 12-15 L), RLE DVT (Eliquis), GERD,  2/13-09/10/2019: hospitalized acute hypoxic respiratory failure d/t COVID, acute  RLE DVTs, protein calorie malnutrition, anemia, anxiety, HLD, RA. *Admitted to Hospice post hospitalization, but revoked as doing better and wish to continue aggressive medical care (physician  visits/medications). Palliative Care was asked to help address goals of care.   CODE STATUS: DNR  PPS: 40%  HOSPICE ELIGIBILITY/DIAGNOSIS: TBD  PAST MEDICAL HISTORY:  Past Medical History:  Diagnosis Date  . GERD (gastroesophageal reflux disease)   . Pneumonia   . Pulmonary fibrosis ([place])     SOCIAL HX:  Social History   Tobacco Use  . Smoking status: Former Smoker    Packs/day: 2.00    Types: Cigarettes    Start date: 07/1967    Quit date: 07/1993    Years since quitting: 26.2  . Smokeless tobacco: Never Used  Substance Use Topics  . Alcohol use: Yes    Comment: rarely    ALLERGIES: No Known Allergies   PERTINENT MEDICATIONS:  Outpatient Encounter Medications as of 10/04/2019  Medication Sig  . acetaminophen (TYLENOL) 500 MG tablet Take 1,000 mg by mouth every 6 (six) hours as needed for moderate pain. Take 3 tablets twice daily   . albuterol (VENTOLIN HFA) 108 (90 Base) MCG/ACT inhaler Inhale 2 puffs into the lungs every 6 (six) hours as needed for wheezing or shortness of breath.  Marland Kitchen apixaban (ELIQUIS) 5 MG TABS tablet Take 1 tablet (5 mg total) by mouth 2 (two) times daily.  Marland Kitchen azaTHIOprine (IMURAN) 50 MG tablet TAKE 3 TABLETS EVERY DAY  . LORazepam (ATIVAN) 0.5 MG tablet Take 0.5 mg by mouth every 4 (four) hours as needed.  . megestrol (MEGACE) 400 MG/10ML suspension Take 10 mLs (400 mg total) by mouth daily.  . Multiple Vitamins-Minerals (CENTRUM SILVER) tablet Take 1 tablet by mouth daily.   Marland Kitchen omeprazole (PRILOSEC) 20 MG capsule Take 1 capsule (20 mg total) by mouth daily.  Marland Kitchen oxyCODONE (OXY IR/ROXICODONE) 5 MG immediate release tablet SMARTSIG:5 Milligram(s) By Mouth Every 4 Hours PRN  . OXYGEN Inhale into the lungs. 3 liters per minute, uses when up and moving around  . Pirfenidone (ESBRIET) 267 MG TABS Take 3 tablets (801 mg total) by mouth 3 (three) times daily.  . predniSONE (DELTASONE) 5 MG tablet Take 1 tablet (5 mg total) by mouth daily with breakfast.  .  RESTASIS 0.05 % ophthalmic emulsion   . simvastatin (ZOCOR) 20 MG tablet TAKE 1 TABLET DAILY AT 6PM (NO FURTHER REFILLS WITHOUT OFFICE VISIT) (Patient taking differently: Take 20 mg by mouth daily at 6 PM. )  . traMADol (ULTRAM) 50 MG tablet Take 50 mg by mouth every 6 (six) hours as needed.   No facility-administered encounter medications on file as of 10/04/2019.    PHYSICAL EXAM:   Well nourish older female in NAD. Granddaughter and daughter Angela Nevin up and about in the home General: NAD. Mild dyspnea with conversation. Pulmonary: posterior base bilateral inspiratory crackles.  Extremities: no edema, no joint deformities Skin: no rashes Neurological: Weakness but otherwise non-focal  Julianne Handler, NP

## 2019-10-04 ENCOUNTER — Encounter: Payer: Self-pay | Admitting: Internal Medicine

## 2019-10-04 ENCOUNTER — Other Ambulatory Visit: Payer: Medicare HMO | Admitting: Internal Medicine

## 2019-10-04 ENCOUNTER — Other Ambulatory Visit: Payer: Self-pay

## 2019-10-04 DIAGNOSIS — Z515 Encounter for palliative care: Secondary | ICD-10-CM | POA: Diagnosis not present

## 2019-10-04 DIAGNOSIS — Z7189 Other specified counseling: Secondary | ICD-10-CM

## 2019-10-04 DIAGNOSIS — J449 Chronic obstructive pulmonary disease, unspecified: Secondary | ICD-10-CM | POA: Diagnosis not present

## 2019-10-04 NOTE — Telephone Encounter (Signed)
Pt's daughter Stacy [name] returning call.  [phone].

## 2019-10-04 NOTE — Telephone Encounter (Signed)
Attempted to call the pt but there was no answer and her VM was full so not able to leave msg, [facility]

## 2019-10-04 NOTE — Telephone Encounter (Signed)
Thanks Danae Chen

## 2019-10-04 NOTE — Telephone Encounter (Signed)
Add humidification to oxygen. Refer to ENT. Stop flonase. Use saline ocean spray twice daily.

## 2019-10-04 NOTE — Telephone Encounter (Signed)
I called and spoke with the patient and she states that she already has humidification with her oxygen. I advised her to stop Flonase and start saline nasal and she verbalized understanding. I have placed an order for ENT.

## 2019-10-04 NOTE — Telephone Encounter (Signed)
Beth, please advise. 

## 2019-10-04 NOTE — Telephone Encounter (Signed)
Primary Pulmonologist: Mannam Last office visit and with whom: 09/24/19 with Beth What do we see them for (pulmonary problems): pulmonary fibrosis, chronic resp fail with hypoxia Last OV assessment/plan: Assessment and Plan:  ILD: - Resume Esbriet 896m TID - Patient would like to continue with curative treatment and medication for her fibrosis - Discontinuing Hospice and referring to palliative care   Chronic respiratory failure: - Hospitalized for Covid-19 pneumonitis in mid-February 202. Discharged on 10-15L oxygen  - Renew oxygen with [employer], ability to go up to 15L oxygen   Decreased appetite: - Will discuss with Dr. MVaughan Brownerabout starting appetite stimulant; caution d/t recent DVT  - For now recommend boost/ensure three time a day   Follow Up Instructions:  - Follow-up in 4 weeks/next available with Dr. MVaughan Browner I discussed the assessment and treatment plan with the patient. The patient was provided an opportunity to ask questions and all were answered. The patient agreed with the plan and demonstrated an understanding of the instructions.  The patient was advised to call back or seek an in-person evaluation if the symptoms worsen or if the condition fails to improve as anticipated.  I provided 28 minutes of non-face-to-face time during this encounter.   EMartyn Ehrich NP  Was appointment offered to patient (explain)?  Pt and daughter want recommendations   Reason for call: Called and spoke with both pt and daughter CAngela Nevinletting them know that Dr. MVaughan Brownersent rx for megace to pharmacy for pt to see if this would help with pt's appetite and they both verbalized understanding.  While speaking with them, pt stated she has been having a lot of issues with nasal congestion and wants to know what could be recommended to help with this. Pt states she is using saline nasal spray as well as fluticasone daily and not getting any relief.  Pt states when she is able to  get anything out of her nose, she will get some blood due to all the congestion she has. Pt is on oxygen 24/7.  Asked pt if she has an ENT and she stated that she does not.  Pt wants to know what we recommend to help with her symptoms. BAaron Edelman please advise.

## 2019-10-04 NOTE — Telephone Encounter (Signed)
Route to EW who is in office and saw the pt last for management.   Wyn Quaker [name]

## 2019-10-07 ENCOUNTER — Telehealth: Payer: Self-pay | Admitting: Pulmonary Disease

## 2019-10-07 NOTE — Telephone Encounter (Signed)
LMTCB x1 for [facility] Case Manager.

## 2019-10-11 ENCOUNTER — Telehealth: Payer: Self-pay

## 2019-10-11 DIAGNOSIS — J449 Chronic obstructive pulmonary disease, unspecified: Secondary | ICD-10-CM | POA: Diagnosis not present

## 2019-10-11 NOTE — Telephone Encounter (Signed)
1.Medication Requested:traMADol (ULTRAM) 50 MG tablet  2. Pharmacy (Name, Street, [place]):  [facility] [address] [facility]  [place] [place]   3. On Med List: Yes   4. Last Visit with PCP: 6.10.20  5. Next visit date with PCP: no appt is made at this time    Agent: Please be advised that RX refills may take up to 3 business days. We ask that you follow-up with your pharmacy.

## 2019-10-11 NOTE — Telephone Encounter (Signed)
All of these items have been ordered for the pt. LMTCB x2 for [facility] Case Manager.

## 2019-10-12 NOTE — Telephone Encounter (Signed)
LMTCB x3 for [facility] Case Manager. We have attempted to contact [employer] several times with no success or call back from them. Per triage protocol, message will be closed.

## 2019-10-12 NOTE — Telephone Encounter (Signed)
Offer her an appointment for med request or have her follow up with however she has been getting Tramadol from?

## 2019-10-13 ENCOUNTER — Telehealth: Payer: Self-pay | Admitting: Internal Medicine

## 2019-10-13 DIAGNOSIS — J849 Interstitial pulmonary disease, unspecified: Secondary | ICD-10-CM | POA: Diagnosis not present

## 2019-10-13 DIAGNOSIS — J9611 Chronic respiratory failure with hypoxia: Secondary | ICD-10-CM | POA: Diagnosis not present

## 2019-10-13 DIAGNOSIS — J84112 Idiopathic pulmonary fibrosis: Secondary | ICD-10-CM | POA: Diagnosis not present

## 2019-10-13 DIAGNOSIS — Z7901 Long term (current) use of anticoagulants: Secondary | ICD-10-CM | POA: Diagnosis not present

## 2019-10-13 DIAGNOSIS — I82409 Acute embolism and thrombosis of unspecified deep veins of unspecified lower extremity: Secondary | ICD-10-CM | POA: Diagnosis not present

## 2019-10-13 DIAGNOSIS — M810 Age-related osteoporosis without current pathological fracture: Secondary | ICD-10-CM | POA: Diagnosis not present

## 2019-10-13 DIAGNOSIS — Z8616 Personal history of COVID-19: Secondary | ICD-10-CM | POA: Diagnosis not present

## 2019-10-13 DIAGNOSIS — K219 Gastro-esophageal reflux disease without esophagitis: Secondary | ICD-10-CM | POA: Diagnosis not present

## 2019-10-13 DIAGNOSIS — Z9981 Dependence on supplemental oxygen: Secondary | ICD-10-CM | POA: Diagnosis not present

## 2019-10-13 NOTE — Telephone Encounter (Signed)
Spoke with patient today and info given.

## 2019-10-13 NOTE — Telephone Encounter (Signed)
4:15pm: Telephone call from patient who requests initiation of opioids for pain control. Patient reported she had contacted her primary care physician's office for tramadol and was told this is not something they were comfortable with. On my last visit to patient, she mentioned she was on tramadol 50 milligram bid with PR an oxycodone. Today patient reported the tramadol and oxycodone were actually from a previous short term prescription, for which she had leftover tabs. I discussed with patient that I would not be initiating opioids for her pain control. That she needed to try more conservative efforts such as ibuprofen and Tylenol. She has Tylenol 650 milligram tablets available. We discussed she could take five of these tablets total a day in divided doses with addition of ibuprofen 200 milligram PO BID. Patient verbalize understanding and was willing to try this. I will be seeing her at her home in f/u on May 6th.  Violeta Gelinas NP-C [phone]

## 2019-10-13 NOTE — Telephone Encounter (Signed)
Yes, we aren't really managing anything for her at this time. She needs to contact whoever has been giving her Tramadol. Maybe pulmonology or palliative/ hospice provider.

## 2019-10-18 ENCOUNTER — Telehealth: Payer: Self-pay | Admitting: Family

## 2019-10-18 ENCOUNTER — Telehealth: Payer: Self-pay | Admitting: Pulmonary Disease

## 2019-10-18 DIAGNOSIS — M546 Pain in thoracic spine: Secondary | ICD-10-CM

## 2019-10-18 DIAGNOSIS — J449 Chronic obstructive pulmonary disease, unspecified: Secondary | ICD-10-CM | POA: Diagnosis not present

## 2019-10-18 NOTE — Telephone Encounter (Signed)
Spoke with patient and info given

## 2019-10-18 NOTE — Telephone Encounter (Signed)
Called and spoke with pt who stated she would like to have a pain medication to be called in to the pharmacy for her.  Pt stated that she is taking 2 640m tylenol every 6 hours for pain but she states that this is not helping.  Pt states that she does not want an opiod medication to be sent in for her but stated that she would like to have something else that she could take other than the tylenol to see if it would work better.  Dr.Mannam, please advise.

## 2019-10-18 NOTE — Telephone Encounter (Signed)
I would prefer that she either call her pulmonologist or palliative care provider. I am not actively  managing any of her care needs at this time.

## 2019-10-18 NOTE — Telephone Encounter (Signed)
    Patient calling, states she has pain all over her body. She is requesting a prescription for Ibuprofen.  Declined to schedule an appointment   Requesting call from Jodi Mourning

## 2019-10-21 DIAGNOSIS — Z7901 Long term (current) use of anticoagulants: Secondary | ICD-10-CM | POA: Diagnosis not present

## 2019-10-21 DIAGNOSIS — J9611 Chronic respiratory failure with hypoxia: Secondary | ICD-10-CM | POA: Diagnosis not present

## 2019-10-21 DIAGNOSIS — Z8616 Personal history of COVID-19: Secondary | ICD-10-CM | POA: Diagnosis not present

## 2019-10-21 DIAGNOSIS — Z9981 Dependence on supplemental oxygen: Secondary | ICD-10-CM | POA: Diagnosis not present

## 2019-10-21 DIAGNOSIS — M810 Age-related osteoporosis without current pathological fracture: Secondary | ICD-10-CM | POA: Diagnosis not present

## 2019-10-21 DIAGNOSIS — K219 Gastro-esophageal reflux disease without esophagitis: Secondary | ICD-10-CM | POA: Diagnosis not present

## 2019-10-21 DIAGNOSIS — J84112 Idiopathic pulmonary fibrosis: Secondary | ICD-10-CM | POA: Diagnosis not present

## 2019-10-21 DIAGNOSIS — I82409 Acute embolism and thrombosis of unspecified deep veins of unspecified lower extremity: Secondary | ICD-10-CM | POA: Diagnosis not present

## 2019-10-21 DIAGNOSIS — J849 Interstitial pulmonary disease, unspecified: Secondary | ICD-10-CM | POA: Diagnosis not present

## 2019-10-22 ENCOUNTER — Telehealth: Payer: Self-pay | Admitting: Internal Medicine

## 2019-10-22 ENCOUNTER — Telehealth: Payer: Self-pay | Admitting: Pulmonary Disease

## 2019-10-22 DIAGNOSIS — Z8616 Personal history of COVID-19: Secondary | ICD-10-CM | POA: Diagnosis not present

## 2019-10-22 DIAGNOSIS — J9611 Chronic respiratory failure with hypoxia: Secondary | ICD-10-CM | POA: Diagnosis not present

## 2019-10-22 DIAGNOSIS — Z9981 Dependence on supplemental oxygen: Secondary | ICD-10-CM | POA: Diagnosis not present

## 2019-10-22 DIAGNOSIS — I82409 Acute embolism and thrombosis of unspecified deep veins of unspecified lower extremity: Secondary | ICD-10-CM | POA: Diagnosis not present

## 2019-10-22 DIAGNOSIS — K219 Gastro-esophageal reflux disease without esophagitis: Secondary | ICD-10-CM | POA: Diagnosis not present

## 2019-10-22 DIAGNOSIS — Z7901 Long term (current) use of anticoagulants: Secondary | ICD-10-CM | POA: Diagnosis not present

## 2019-10-22 DIAGNOSIS — M810 Age-related osteoporosis without current pathological fracture: Secondary | ICD-10-CM | POA: Diagnosis not present

## 2019-10-22 DIAGNOSIS — J84112 Idiopathic pulmonary fibrosis: Secondary | ICD-10-CM | POA: Diagnosis not present

## 2019-10-22 DIAGNOSIS — J849 Interstitial pulmonary disease, unspecified: Secondary | ICD-10-CM | POA: Diagnosis not present

## 2019-10-22 MED ORDER — KETOROLAC TROMETHAMINE 10 MG PO TABS: 10.0000 mg | ORAL_TABLET | Freq: Three times a day (TID) | ORAL | Status: AC | PRN

## 2019-10-22 MED ORDER — KETOROLAC TROMETHAMINE 10 MG PO TABS: 10.0000 mg | ORAL_TABLET | Freq: Four times a day (QID) | ORAL | 0 refills | Status: AC | PRN

## 2019-10-22 NOTE — Telephone Encounter (Signed)
Ok to give order for PT

## 2019-10-22 NOTE — Telephone Encounter (Signed)
Pt paged me w/in 15 min of office closure demanding > 5 days rx for tramadol written by Dr Vaughan Browner today. Chart reviewed and see she is under palliative care with oxycodone also listed.  I am uncomfortable calling her from my cell phone and will let Dr Vaughan Browner work this out next week as the 5 day supply will last thru the weekend but concerned palliation should be provided by one source = palliative care to prevent duplication of comfort rx and one contact for pt to work this out.

## 2019-10-22 NOTE — Telephone Encounter (Signed)
Dr. Vaughan Browner, can you please reorder the Toradol? It was ordered as a hospital administered medication instead of being sent to the pharmacy.   Spoke with patient. She is aware that Dr. Vaughan Browner will call in the Toradol for her and we are working to fix the prescription.

## 2019-10-22 NOTE — Telephone Encounter (Signed)
Ok. Done

## 2019-10-22 NOTE — Telephone Encounter (Signed)
Dr. Vaughan Browner please advise if we can give verbal orders for physical therapy.

## 2019-10-22 NOTE — Telephone Encounter (Signed)
Spoke with pt, aware that this has been sent in.  Nothing further needed at this time- will close encounter.

## 2019-10-22 NOTE — Telephone Encounter (Signed)
Spoke with Charisse, I gave her verbal orders for physical therapy. Nothing further is needed.

## 2019-10-22 NOTE — Telephone Encounter (Signed)
I have send an order for Toradol to her pharmacy

## 2019-10-25 DIAGNOSIS — Z9981 Dependence on supplemental oxygen: Secondary | ICD-10-CM | POA: Diagnosis not present

## 2019-10-25 DIAGNOSIS — J84112 Idiopathic pulmonary fibrosis: Secondary | ICD-10-CM | POA: Diagnosis not present

## 2019-10-25 DIAGNOSIS — J9611 Chronic respiratory failure with hypoxia: Secondary | ICD-10-CM | POA: Diagnosis not present

## 2019-10-25 DIAGNOSIS — I82409 Acute embolism and thrombosis of unspecified deep veins of unspecified lower extremity: Secondary | ICD-10-CM | POA: Diagnosis not present

## 2019-10-25 DIAGNOSIS — J849 Interstitial pulmonary disease, unspecified: Secondary | ICD-10-CM | POA: Diagnosis not present

## 2019-10-25 DIAGNOSIS — Z7901 Long term (current) use of anticoagulants: Secondary | ICD-10-CM | POA: Diagnosis not present

## 2019-10-25 DIAGNOSIS — Z8616 Personal history of COVID-19: Secondary | ICD-10-CM | POA: Diagnosis not present

## 2019-10-25 DIAGNOSIS — M810 Age-related osteoporosis without current pathological fracture: Secondary | ICD-10-CM | POA: Diagnosis not present

## 2019-10-25 DIAGNOSIS — K219 Gastro-esophageal reflux disease without esophagitis: Secondary | ICD-10-CM | POA: Diagnosis not present

## 2019-10-26 DIAGNOSIS — Z9981 Dependence on supplemental oxygen: Secondary | ICD-10-CM | POA: Diagnosis not present

## 2019-10-26 DIAGNOSIS — J849 Interstitial pulmonary disease, unspecified: Secondary | ICD-10-CM | POA: Diagnosis not present

## 2019-10-26 DIAGNOSIS — Z7901 Long term (current) use of anticoagulants: Secondary | ICD-10-CM | POA: Diagnosis not present

## 2019-10-26 DIAGNOSIS — J84112 Idiopathic pulmonary fibrosis: Secondary | ICD-10-CM | POA: Diagnosis not present

## 2019-10-26 DIAGNOSIS — M810 Age-related osteoporosis without current pathological fracture: Secondary | ICD-10-CM | POA: Diagnosis not present

## 2019-10-26 DIAGNOSIS — I82409 Acute embolism and thrombosis of unspecified deep veins of unspecified lower extremity: Secondary | ICD-10-CM | POA: Diagnosis not present

## 2019-10-26 DIAGNOSIS — Z8616 Personal history of COVID-19: Secondary | ICD-10-CM | POA: Diagnosis not present

## 2019-10-26 DIAGNOSIS — J9611 Chronic respiratory failure with hypoxia: Secondary | ICD-10-CM | POA: Diagnosis not present

## 2019-10-26 DIAGNOSIS — K219 Gastro-esophageal reflux disease without esophagitis: Secondary | ICD-10-CM | POA: Diagnosis not present

## 2019-10-27 ENCOUNTER — Other Ambulatory Visit: Payer: Self-pay

## 2019-10-27 ENCOUNTER — Telehealth: Payer: Self-pay | Admitting: Internal Medicine

## 2019-10-27 ENCOUNTER — Encounter: Payer: Self-pay | Admitting: Pulmonary Disease

## 2019-10-27 ENCOUNTER — Ambulatory Visit (INDEPENDENT_AMBULATORY_CARE_PROVIDER_SITE_OTHER): Payer: Medicare HMO | Admitting: Pulmonary Disease

## 2019-10-27 VITALS — BP 100/62 | HR 81 | Ht <= 58 in | Wt 132.0 lb

## 2019-10-27 DIAGNOSIS — J84112 Idiopathic pulmonary fibrosis: Secondary | ICD-10-CM

## 2019-10-27 MED ORDER — PREDNISONE 5 MG PO TABS: 2.5000 mg | ORAL_TABLET | Freq: Every day | ORAL | 0 refills | Status: DC

## 2019-10-27 MED ORDER — IBUPROFEN 600 MG PO TABS: 600.0000 mg | ORAL_TABLET | Freq: Three times a day (TID) | ORAL | 0 refills | Status: DC

## 2019-10-27 NOTE — Telephone Encounter (Signed)
Yes. That is fine to give verbal for nurse aid

## 2019-10-27 NOTE — Patient Instructions (Signed)
We will schedule you for spirometry, diffusion capacity and high-resolution CT of the chest Reduce the prednisone to 2.5 mg a day for 2 weeks and then stop Continue the Imuran and Esbriet for now  Will prescribe ibuprofen 600 mg 3 times a day as needed for pain Prescribe Ambien 6.25 mg at night  Follow-up in 3 months.

## 2019-10-27 NOTE — Telephone Encounter (Signed)
Spoke with Steffanie Dunn, advised her that Dr. Vaughan Browner did give the verbal order for nurse aid. She understood and nothing further is needed.

## 2019-10-27 NOTE — Progress Notes (Addendum)
Stacy [name]    [id number]    [birth year]  Primary Care Physician:Murray, Marvis Repress, [place]  Referring Physician: Marrian Salvage, [place],  [place] [address]  Chief complaint: Follow-up for IPF  HPI: 64 year old with history of biopsy-proven UIP pulmonary fibrosis.  This is diagnosed with a VATS biopsy in 2003 at [facility].  Followed by Dr. Allean Found, pulmonary at [facility].  She is transferring care to this area. She has been maintained on Imuran since her diagnosis of pulmonary fibrosis.  She had been tried on Ofev from 2015-18 but had to stop due to severe GI symptoms of diarrhea.  She is currently on Esbriet which she is tolerating well.  Evaluated at [facility] transplant in 2018 but deemed not a candidate as she is a Sales promotion account executive [employer]  She had connective tissue disease profile, serology sent off at [facility] in 2018 which were negative.  Received note from [facility] dated November 03, 2017 Per pulmonologist assessment she has been quite stable on azathioprine, prednisone, Bactrim and Esbriet.  Since she is not candidate for lung transplant and due her stability the decision was made to continue these medications. Assessment is that she needs Imuran as there was a period of time in 2018 when she was unable to obtain the azathioprine due to insurance issues and she ended up admitted in the hospital with a flare of pulmonary fibrosis.  So hence the decision is made that this is an essential medication for her.  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 ILD questionnaire for exposures 10/02/2018- negative for any exposures.  No mold, hot tub, Jacuzzi, humidifier Smoking history: 50-pack-year smoker.  Quit in 95 Travel history: Significant travel history Relevant family history: No significant family history of lung disease.  Interim history: Since her last clinic visit with me she has had 2 admissions.  The first  in January 2021 with community-acquired pneumonia treated with antibiotics.  She was readmitted on 2/13 with COVID-19 pneumonia.  Treated with steroids, remdesivir, convalescent plasma.  Also had extensive lower extremity DVTs during hospital stay.  Palliative care consulted during this admission and initially referred to hospice.  At home patient decided she wanted to continue aggressive therapy including antifibrotics and other medications and was taken off hospice per her request and referred to palliative care.  Continues on azathioprine, chronic prednisone and pirfenidone without issue  Outpatient Encounter Medications as of 10/27/2019  Medication Sig  . acetaminophen (TYLENOL) 500 MG tablet Take 1,000 mg by mouth every 6 (six) hours as needed for moderate pain. Take 3 tablets twice daily   . albuterol (VENTOLIN HFA) 108 (90 Base) MCG/ACT inhaler Inhale 2 puffs into the lungs every 6 (six) hours as needed for wheezing or shortness of breath.  Marland Kitchen apixaban (ELIQUIS) 5 MG TABS tablet Take 1 tablet (5 mg total) by mouth 2 (two) times daily.  Marland Kitchen azaTHIOprine (IMURAN) 50 MG tablet TAKE 3 TABLETS EVERY DAY  . ketorolac (TORADOL) 10 MG tablet Take 1 tablet (10 mg total) by mouth every 6 (six) hours as needed.  Marland Kitchen LORazepam (ATIVAN) 0.5 MG tablet Take 0.5 mg by mouth every 4 (four) hours as needed.  . megestrol (MEGACE) 400 MG/10ML suspension Take 10 mLs (400 mg total) by mouth daily.  . Multiple Vitamins-Minerals (CENTRUM SILVER) tablet Take 1 tablet by mouth daily.   Marland Kitchen omeprazole (PRILOSEC) 20 MG capsule Take 1 capsule (20 mg total) by  mouth daily.  Marland Kitchen oxyCODONE (OXY IR/ROXICODONE) 5 MG immediate release tablet SMARTSIG:5 Milligram(s) By Mouth Every 4 Hours PRN  . OXYGEN Inhale into the lungs. 3 liters per minute, uses when up and moving around  . Pirfenidone (ESBRIET) 267 MG TABS Take 3 tablets (801 mg total) by mouth 3 (three) times daily.  . predniSONE (DELTASONE) 5 MG tablet Take 1 tablet (5 mg  total) by mouth daily with breakfast.  . RESTASIS 0.05 % ophthalmic emulsion   . simvastatin (ZOCOR) 20 MG tablet TAKE 1 TABLET DAILY AT 6PM (NO FURTHER REFILLS WITHOUT OFFICE VISIT) (Patient taking differently: Take 20 mg by mouth daily at 6 PM. )  . traMADol (ULTRAM) 50 MG tablet Take 50 mg by mouth every 6 (six) hours as needed.   Facility-Administered Encounter Medications as of 10/27/2019  Medication  . ketorolac (TORADOL) tablet 10 mg   Physical Exam: Blood pressure 100/62, pulse 81, height 1' (0.305 m), weight 132 lb (59.9 kg), SpO2 99 %. Gen:      No acute distress HEENT:  EOMI, sclera anicteric Neck:     No masses; no thyromegaly Lungs:    Basal crackles CV:         Regular rate and rhythm; no murmurs Abd:      + bowel sounds; soft, non-tender; no palpable masses, no distension Ext:    No edema; adequate peripheral perfusion Skin:      Warm and dry; no rash Neuro: alert and oriented x 3 Psych: normal mood and affect  Data Reviewed: Imaging: CT high-resolution 11/23/2018- pulmonary fibrosis and UIP pattern.  Dilatation of pulmonary trunk suggestive of pulmonary arterial hypertension.  Aortic atherosclerosis  PFTs: 01/28/2019 FVC 1.73 [64%], FEV1 1.49 [70%], F/F 86, TLC 2.5 [47%], DLCO 8.66 [41%] Severe restriction and diffusion defect  Labs: 05/05/2017, [facility]-  Myositis panel-negative SSA less than 20, rheumatoid factor negative, ANCA-negative, ANA-negative CCP-negative  CTD serologies 09/30/2018- negative ANA, CCP, rheumatoid factor, Ro, [place], SCL 70  Cardiac: Echocardiogram 03/10/2019-LVEF 60-65%, mildly elevated RVSP-41.4  Assessment:  IPF She has biopsy-proven UIP fibrosis.  Currently on pirfenidone She is also on Imuran and prednisone which is not standard treatment for IPF.  Per review of records from previous pulmonologist it was felt there is a component of inflammatory pneumonitis and she had a deterioration in past when Imuran was held due to mixup in the  insurance.  However it is not clear if the deterioration was related to the interruption.  We will try to get her off immunosuppressive's gradually.  I have asked her to reduce prednisone to 2.5 mg a day for a couple of weeks and then stop If she is stable after this we will work on getting her off the Imuran.  Limited mobility Patient has trouble breathing at night when head is elevated less than 30 degrees. Bed wedges do not provide enough elevation to resolve breathing issues. Shortness of breath causes the patient to require immediate changes in body position which cannot be achieved with a normal bed.   Patient suffers from IPF which impairs their ability to perform daily activities like (dressing, grooming, bathing) in the home. A cane, walker or crutch will not resolve issues. A wheelchair will allow the patient to safely perform daily activities. Patient can safely self-propel the wheelchair in the home or has a caregiver that can provide assistance  We will order semi electric bed and wheelchair   Post COVID-19 Stable symptomatically Get high-res CT, spirometry and diffusion capacity.  Pulmonary hypertension Likely secondary to interstitial lung disease May be a candidate for tyvaso in the future.  Chronic pain Currently on Tylenol.  Will add ibuprofen to use alternating with Tylenol. We had prescribed Toradol but it was not covered by insurance  Insomnia Ambien nightly  Goals of care Has rescinded hospice and is being followed with palliative care Code status is DNR Discussed with patient and daughter in office.  They are not ready for hospice just yet as it would mean stopping her ILD medications.   Plan/Recommendations: - High-res CT, PFTs - Taper prednisone - Tylenol, ibuprofen - Ambien  Marshell Garfinkel MD [facility] 10/27/2019, 2:09 PM  CC: Marrian Salvage,*

## 2019-10-27 NOTE — Telephone Encounter (Signed)
Spoke with Kristi with [facility]. She stated that the patient's daughter had expressed interest in getting a nurse aid to help with taking care of her mom. She requested an order for a nurse aid to come out at least twice a week to help with ADLs.   I advised Steffanie Dunn that the patient was in the office today for a visit but based on the note, I do not believe this was discussed. I also advised her that I would send a message over to Dr. Vaughan Browner. She verbalized understanding.   Dr. Vaughan Browner, please advise if you are ok with [place] providing a verbal for a nurse aid for the patient. Thanks!

## 2019-10-28 ENCOUNTER — Telehealth: Payer: Self-pay | Admitting: Pulmonary Disease

## 2019-10-28 DIAGNOSIS — Z9981 Dependence on supplemental oxygen: Secondary | ICD-10-CM | POA: Diagnosis not present

## 2019-10-28 DIAGNOSIS — J84112 Idiopathic pulmonary fibrosis: Secondary | ICD-10-CM | POA: Diagnosis not present

## 2019-10-28 DIAGNOSIS — Z8616 Personal history of COVID-19: Secondary | ICD-10-CM | POA: Diagnosis not present

## 2019-10-28 DIAGNOSIS — Z7901 Long term (current) use of anticoagulants: Secondary | ICD-10-CM | POA: Diagnosis not present

## 2019-10-28 DIAGNOSIS — I82409 Acute embolism and thrombosis of unspecified deep veins of unspecified lower extremity: Secondary | ICD-10-CM | POA: Diagnosis not present

## 2019-10-28 DIAGNOSIS — J849 Interstitial pulmonary disease, unspecified: Secondary | ICD-10-CM | POA: Diagnosis not present

## 2019-10-28 DIAGNOSIS — K219 Gastro-esophageal reflux disease without esophagitis: Secondary | ICD-10-CM | POA: Diagnosis not present

## 2019-10-28 DIAGNOSIS — J9611 Chronic respiratory failure with hypoxia: Secondary | ICD-10-CM | POA: Diagnosis not present

## 2019-10-28 DIAGNOSIS — M810 Age-related osteoporosis without current pathological fracture: Secondary | ICD-10-CM | POA: Diagnosis not present

## 2019-10-28 NOTE — Telephone Encounter (Signed)
Spoke with Radovan. Provided him with the verbal order.   Nothing further needed at time of call.

## 2019-10-31 DIAGNOSIS — J449 Chronic obstructive pulmonary disease, unspecified: Secondary | ICD-10-CM | POA: Diagnosis not present

## 2019-10-31 DIAGNOSIS — J45909 Unspecified asthma, uncomplicated: Secondary | ICD-10-CM | POA: Diagnosis not present

## 2019-11-01 DIAGNOSIS — J342 Deviated nasal septum: Secondary | ICD-10-CM | POA: Diagnosis not present

## 2019-11-01 DIAGNOSIS — J9611 Chronic respiratory failure with hypoxia: Secondary | ICD-10-CM | POA: Diagnosis not present

## 2019-11-01 DIAGNOSIS — J343 Hypertrophy of nasal turbinates: Secondary | ICD-10-CM | POA: Diagnosis not present

## 2019-11-01 DIAGNOSIS — Z9981 Dependence on supplemental oxygen: Secondary | ICD-10-CM | POA: Diagnosis not present

## 2019-11-01 DIAGNOSIS — J849 Interstitial pulmonary disease, unspecified: Secondary | ICD-10-CM | POA: Diagnosis not present

## 2019-11-01 DIAGNOSIS — J84112 Idiopathic pulmonary fibrosis: Secondary | ICD-10-CM | POA: Diagnosis not present

## 2019-11-01 DIAGNOSIS — Z8616 Personal history of COVID-19: Secondary | ICD-10-CM | POA: Diagnosis not present

## 2019-11-01 DIAGNOSIS — Z7901 Long term (current) use of anticoagulants: Secondary | ICD-10-CM | POA: Diagnosis not present

## 2019-11-01 DIAGNOSIS — J310 Chronic rhinitis: Secondary | ICD-10-CM | POA: Diagnosis not present

## 2019-11-01 DIAGNOSIS — K219 Gastro-esophageal reflux disease without esophagitis: Secondary | ICD-10-CM | POA: Diagnosis not present

## 2019-11-01 DIAGNOSIS — M810 Age-related osteoporosis without current pathological fracture: Secondary | ICD-10-CM | POA: Diagnosis not present

## 2019-11-01 DIAGNOSIS — I82409 Acute embolism and thrombosis of unspecified deep veins of unspecified lower extremity: Secondary | ICD-10-CM | POA: Diagnosis not present

## 2019-11-02 DIAGNOSIS — Z7901 Long term (current) use of anticoagulants: Secondary | ICD-10-CM | POA: Diagnosis not present

## 2019-11-02 DIAGNOSIS — J9611 Chronic respiratory failure with hypoxia: Secondary | ICD-10-CM | POA: Diagnosis not present

## 2019-11-02 DIAGNOSIS — Z8616 Personal history of COVID-19: Secondary | ICD-10-CM | POA: Diagnosis not present

## 2019-11-02 DIAGNOSIS — K219 Gastro-esophageal reflux disease without esophagitis: Secondary | ICD-10-CM | POA: Diagnosis not present

## 2019-11-02 DIAGNOSIS — J84112 Idiopathic pulmonary fibrosis: Secondary | ICD-10-CM | POA: Diagnosis not present

## 2019-11-02 DIAGNOSIS — Z9981 Dependence on supplemental oxygen: Secondary | ICD-10-CM | POA: Diagnosis not present

## 2019-11-02 DIAGNOSIS — I82409 Acute embolism and thrombosis of unspecified deep veins of unspecified lower extremity: Secondary | ICD-10-CM | POA: Diagnosis not present

## 2019-11-02 DIAGNOSIS — M810 Age-related osteoporosis without current pathological fracture: Secondary | ICD-10-CM | POA: Diagnosis not present

## 2019-11-02 DIAGNOSIS — J849 Interstitial pulmonary disease, unspecified: Secondary | ICD-10-CM | POA: Diagnosis not present

## 2019-11-03 DIAGNOSIS — M810 Age-related osteoporosis without current pathological fracture: Secondary | ICD-10-CM | POA: Diagnosis not present

## 2019-11-03 DIAGNOSIS — Z8616 Personal history of COVID-19: Secondary | ICD-10-CM | POA: Diagnosis not present

## 2019-11-03 DIAGNOSIS — Z7901 Long term (current) use of anticoagulants: Secondary | ICD-10-CM | POA: Diagnosis not present

## 2019-11-03 DIAGNOSIS — I82409 Acute embolism and thrombosis of unspecified deep veins of unspecified lower extremity: Secondary | ICD-10-CM | POA: Diagnosis not present

## 2019-11-03 DIAGNOSIS — J9611 Chronic respiratory failure with hypoxia: Secondary | ICD-10-CM | POA: Diagnosis not present

## 2019-11-03 DIAGNOSIS — J849 Interstitial pulmonary disease, unspecified: Secondary | ICD-10-CM | POA: Diagnosis not present

## 2019-11-03 DIAGNOSIS — Z9981 Dependence on supplemental oxygen: Secondary | ICD-10-CM | POA: Diagnosis not present

## 2019-11-03 DIAGNOSIS — K219 Gastro-esophageal reflux disease without esophagitis: Secondary | ICD-10-CM | POA: Diagnosis not present

## 2019-11-03 DIAGNOSIS — J84112 Idiopathic pulmonary fibrosis: Secondary | ICD-10-CM | POA: Diagnosis not present

## 2019-11-04 DIAGNOSIS — I82409 Acute embolism and thrombosis of unspecified deep veins of unspecified lower extremity: Secondary | ICD-10-CM | POA: Diagnosis not present

## 2019-11-04 DIAGNOSIS — J9611 Chronic respiratory failure with hypoxia: Secondary | ICD-10-CM | POA: Diagnosis not present

## 2019-11-04 DIAGNOSIS — Z9981 Dependence on supplemental oxygen: Secondary | ICD-10-CM | POA: Diagnosis not present

## 2019-11-04 DIAGNOSIS — J849 Interstitial pulmonary disease, unspecified: Secondary | ICD-10-CM | POA: Diagnosis not present

## 2019-11-04 DIAGNOSIS — Z8616 Personal history of COVID-19: Secondary | ICD-10-CM | POA: Diagnosis not present

## 2019-11-04 DIAGNOSIS — J84112 Idiopathic pulmonary fibrosis: Secondary | ICD-10-CM | POA: Diagnosis not present

## 2019-11-04 DIAGNOSIS — M810 Age-related osteoporosis without current pathological fracture: Secondary | ICD-10-CM | POA: Diagnosis not present

## 2019-11-04 DIAGNOSIS — Z7901 Long term (current) use of anticoagulants: Secondary | ICD-10-CM | POA: Diagnosis not present

## 2019-11-04 DIAGNOSIS — K219 Gastro-esophageal reflux disease without esophagitis: Secondary | ICD-10-CM | POA: Diagnosis not present

## 2019-11-05 ENCOUNTER — Telehealth: Payer: Self-pay | Admitting: Pulmonary Disease

## 2019-11-05 DIAGNOSIS — Z7901 Long term (current) use of anticoagulants: Secondary | ICD-10-CM | POA: Diagnosis not present

## 2019-11-05 DIAGNOSIS — J849 Interstitial pulmonary disease, unspecified: Secondary | ICD-10-CM | POA: Diagnosis not present

## 2019-11-05 DIAGNOSIS — Z8616 Personal history of COVID-19: Secondary | ICD-10-CM | POA: Diagnosis not present

## 2019-11-05 DIAGNOSIS — K219 Gastro-esophageal reflux disease without esophagitis: Secondary | ICD-10-CM | POA: Diagnosis not present

## 2019-11-05 DIAGNOSIS — M810 Age-related osteoporosis without current pathological fracture: Secondary | ICD-10-CM | POA: Diagnosis not present

## 2019-11-05 DIAGNOSIS — J9611 Chronic respiratory failure with hypoxia: Secondary | ICD-10-CM | POA: Diagnosis not present

## 2019-11-05 DIAGNOSIS — Z9981 Dependence on supplemental oxygen: Secondary | ICD-10-CM | POA: Diagnosis not present

## 2019-11-05 DIAGNOSIS — I82409 Acute embolism and thrombosis of unspecified deep veins of unspecified lower extremity: Secondary | ICD-10-CM | POA: Diagnosis not present

## 2019-11-05 DIAGNOSIS — J84112 Idiopathic pulmonary fibrosis: Secondary | ICD-10-CM | POA: Diagnosis not present

## 2019-11-05 MED ORDER — ZOLPIDEM TARTRATE ER 6.25 MG PO TBCR: 6.2500 mg | EXTENDED_RELEASE_TABLET | Freq: Every evening | ORAL | 0 refills | Status: AC | PRN

## 2019-11-05 NOTE — Telephone Encounter (Signed)
Spoke with pt. She is questioning if Ambien is covered by her insurance. Per her AVS instructions, Dr. Vaughan Browner wanted to prescribe her Ambien. This prescription was not sent in. I have called this prescription [facility] to [facility]. Pt will call and let us know if she has any trouble filling it. Advised pt that prednisone does not come in 2.54m tablets, she will have to cut 567mtablets in half. She verbalized understanding. Nothing further needed.

## 2019-11-08 ENCOUNTER — Telehealth: Payer: Self-pay | Admitting: Pulmonary Disease

## 2019-11-08 DIAGNOSIS — Z8616 Personal history of COVID-19: Secondary | ICD-10-CM | POA: Diagnosis not present

## 2019-11-08 DIAGNOSIS — J849 Interstitial pulmonary disease, unspecified: Secondary | ICD-10-CM | POA: Diagnosis not present

## 2019-11-08 DIAGNOSIS — Z9981 Dependence on supplemental oxygen: Secondary | ICD-10-CM | POA: Diagnosis not present

## 2019-11-08 DIAGNOSIS — M810 Age-related osteoporosis without current pathological fracture: Secondary | ICD-10-CM | POA: Diagnosis not present

## 2019-11-08 DIAGNOSIS — J84112 Idiopathic pulmonary fibrosis: Secondary | ICD-10-CM

## 2019-11-08 DIAGNOSIS — I82409 Acute embolism and thrombosis of unspecified deep veins of unspecified lower extremity: Secondary | ICD-10-CM | POA: Diagnosis not present

## 2019-11-08 DIAGNOSIS — J9611 Chronic respiratory failure with hypoxia: Secondary | ICD-10-CM | POA: Diagnosis not present

## 2019-11-08 DIAGNOSIS — Z7901 Long term (current) use of anticoagulants: Secondary | ICD-10-CM | POA: Diagnosis not present

## 2019-11-08 DIAGNOSIS — K219 Gastro-esophageal reflux disease without esophagitis: Secondary | ICD-10-CM | POA: Diagnosis not present

## 2019-11-08 NOTE — Telephone Encounter (Signed)
Patient is needing order for dme equipment signed asap by Dr. Jerelene Redden please advise if you have a DME form for this patient, or if we need to have this refaxed to the office.  This is for shower chair, hospital bed, bedside commode, wheelchair.  Thanks!

## 2019-11-09 ENCOUNTER — Telehealth: Payer: Self-pay | Admitting: Pulmonary Disease

## 2019-11-09 ENCOUNTER — Inpatient Hospital Stay (HOSPITAL_COMMUNITY): Admission: RE | Admit: 2019-11-09 | Payer: Medicare HMO | Source: Ambulatory Visit

## 2019-11-09 DIAGNOSIS — M810 Age-related osteoporosis without current pathological fracture: Secondary | ICD-10-CM | POA: Diagnosis not present

## 2019-11-09 DIAGNOSIS — Z8616 Personal history of COVID-19: Secondary | ICD-10-CM | POA: Diagnosis not present

## 2019-11-09 DIAGNOSIS — Z9981 Dependence on supplemental oxygen: Secondary | ICD-10-CM | POA: Diagnosis not present

## 2019-11-09 DIAGNOSIS — K219 Gastro-esophageal reflux disease without esophagitis: Secondary | ICD-10-CM | POA: Diagnosis not present

## 2019-11-09 DIAGNOSIS — J849 Interstitial pulmonary disease, unspecified: Secondary | ICD-10-CM | POA: Diagnosis not present

## 2019-11-09 DIAGNOSIS — J9611 Chronic respiratory failure with hypoxia: Secondary | ICD-10-CM | POA: Diagnosis not present

## 2019-11-09 DIAGNOSIS — I82409 Acute embolism and thrombosis of unspecified deep veins of unspecified lower extremity: Secondary | ICD-10-CM | POA: Diagnosis not present

## 2019-11-09 DIAGNOSIS — Z7901 Long term (current) use of anticoagulants: Secondary | ICD-10-CM | POA: Diagnosis not present

## 2019-11-09 DIAGNOSIS — J84112 Idiopathic pulmonary fibrosis: Secondary | ICD-10-CM | POA: Diagnosis not present

## 2019-11-09 NOTE — Telephone Encounter (Signed)
The only forms that we have found for this patient are Plan of Care forms. Dr. Vaughan Browner signed what we had and they have been faxed back to [facility] fax # [phone]

## 2019-11-09 NOTE — Telephone Encounter (Signed)
Spoke with Radovan to make him aware that we have not received any order forms for the patient. He stated that a signed note from Dr. Vaughan Browner would be ok. We could fax the note to [phone].   Dr. Vaughan Browner, please advise if you are ok with [place] typing a note stating that the patient needs the following items: shower chair, hospital bed, bedside commode and wheelchair. Thanks!

## 2019-11-09 NOTE — Telephone Encounter (Signed)
Order has been placed requesting the below items to be dispensed to patient through [facility].

## 2019-11-09 NOTE — Telephone Encounter (Signed)
Called and spoke with [place]. Seward Carol is currently with Patient.  Seward Carol was made aware of previous message and waiting on Dr. Matilde Bash response.

## 2019-11-09 NOTE — Telephone Encounter (Signed)
lmtcb for home health [place]

## 2019-11-09 NOTE — Telephone Encounter (Signed)
Fax for [facility]: [phone]

## 2019-11-09 NOTE — Telephone Encounter (Signed)
Called and spoke with Patient. Patient rescheduled for PFT- diffusion capacity, 12/22/19 at 3pm.  Patient scheduled covid testing 12/20/19 at 3:10.  Patient stated she is having extreme shob since she started 73m Prednisone 2 days ago. Patient requested to increase he Prednisone. Patient stated she is on 12 L O2 and sats were currently 77%. Patient stated sats increase when she is not talking. Instructed Patient to rest and sit quietly, after around 1 minute, Patient stated sats were 89%. Advised Patient on emergency care. Patient stated she has someone with her, and did not want to see any emergency care.   Message routed to Dr. MVaughan Brownerto advise

## 2019-11-09 NOTE — Telephone Encounter (Signed)
Patient scheduled for spirometry on 11/12/2019. [facility] cancelled her Covid 19 test for today. Patient would like to know if she needs the test. Patient phone number is [phone].

## 2019-11-09 NOTE — Telephone Encounter (Signed)
Spoke with Alden and notified of recs per Mannam and she verbalized understanding

## 2019-11-09 NOTE — Telephone Encounter (Signed)
We can reschedule the PFTs to 90 days after the COVID diagnosis.

## 2019-11-09 NOTE — Telephone Encounter (Signed)
Pt called back-- would like to do this through [employer]- please send prescription   Pt requesting: Bed Wheelchair w/ cushion Bedside toilet Stool for shower

## 2019-11-09 NOTE — Telephone Encounter (Signed)
Called and spoke with pt who stated [facility] cancelled pt's covid test which was scheduled today 5/4 as pt had covid in February 2021. Due to this is why she stated they cancelled her covid test as they said she was not 90days out from when she was first diagnosed with covid. Due to this, pt wants to know if she is okay to keep her scheduled spirometry on Friday 5/7.  Dr. Vaughan Browner, please advise on this if it is okay for pt to keep her spirometry appt on 5/7 since we are not able to have her get a covid test done or if we need to reschedule her appt until after she is 90-days post from when she was first diagnosed with Covid when we can be able to have her get a covid test done prior to the spirometry.

## 2019-11-09 NOTE — Telephone Encounter (Signed)
Pt's home nurse called -- when arrived pt's o2 was down to 70%-- pt is only taking 2.5 mg of prednisone--o2 has gone back up abut waivers and is unstable.  Home nurse: [place] [phone]

## 2019-11-09 NOTE — Telephone Encounter (Signed)
Okay to go back up on prednisone to previous dose.  I believe she was on 5 mg previously. If oxygen levels are persistently low then she may need to go to the emergency room.

## 2019-11-09 NOTE — Telephone Encounter (Signed)
Okay to give note stating that she requires these DME items.

## 2019-11-10 ENCOUNTER — Ambulatory Visit
Admission: RE | Admit: 2019-11-10 | Discharge: 2019-11-10 | Disposition: A | Discharge status: Home / Self Care | Payer: Medicare HMO | Source: Ambulatory Visit | Attending: Pulmonary Disease | Admitting: Pulmonary Disease

## 2019-11-10 DIAGNOSIS — J8410 Pulmonary fibrosis, unspecified: Secondary | ICD-10-CM | POA: Diagnosis not present

## 2019-11-10 DIAGNOSIS — J84112 Idiopathic pulmonary fibrosis: Secondary | ICD-10-CM

## 2019-11-10 DIAGNOSIS — J449 Chronic obstructive pulmonary disease, unspecified: Secondary | ICD-10-CM | POA: Diagnosis not present

## 2019-11-10 NOTE — Telephone Encounter (Signed)
Provided pt w/ [facility]'s phone number to r/s appt.  Pt verbalized understanding & nothing further needed at this time.

## 2019-11-10 NOTE — Addendum Note (Signed)
Addended by: Len Blalock on: 11/10/2019 02:18 PM   Modules accepted: Orders

## 2019-11-11 ENCOUNTER — Other Ambulatory Visit: Payer: Medicare HMO | Admitting: Internal Medicine

## 2019-11-11 ENCOUNTER — Other Ambulatory Visit: Payer: Self-pay

## 2019-11-11 ENCOUNTER — Telehealth: Payer: Self-pay | Admitting: Pulmonary Disease

## 2019-11-11 DIAGNOSIS — Z7189 Other specified counseling: Secondary | ICD-10-CM

## 2019-11-11 DIAGNOSIS — Z515 Encounter for palliative care: Secondary | ICD-10-CM | POA: Diagnosis not present

## 2019-11-11 NOTE — Telephone Encounter (Signed)
Dr. Vaughan Browner - for pt to be able to have insurance cover the electric hospital bed and wheelchair, the below documentation will be need to be added to her last OV note.

## 2019-11-11 NOTE — Telephone Encounter (Signed)
[facility] is requesting pt's OV note reflect the need for a Semi Electric Bed & STD Wheelchair.  They have listed some examples of this below;   [employer]!   We are missing the progress note for the [employer] and STD Wheelchair. Please have the MD amend the office visit notes to include both of these and then we can move forward with setting the patient up. I have included examples below.   BED:  Patient requires frequent re-positioning of the body in ways that cannot be achieved with an ordinary bed or wedge pillow, to eliminate pain and reduce pressure.  ------------OR-----------   Patient has trouble breathing at night when head is elevated less than 30 degrees. Bed wedges do not provide enough elevation to resolve breathing issues. Shortness of breath causes the patient to require immediate changes in body position which cannot be achieved with a normal bed.   WC:  "Patient suffers from _____ which impairs their ability to perform daily activities like (dressing, grooming, bathing) in the home. A cane, walker or crutch will not resolve issues. A wheelchair will allow the patient to safely perform daily activities. Patient can safely self-propel the wheelchair in the home or has a caregiver that can provide assistance."    Thanks!

## 2019-11-12 ENCOUNTER — Other Ambulatory Visit: Payer: Medicare HMO

## 2019-11-12 ENCOUNTER — Encounter: Payer: Self-pay | Admitting: Internal Medicine

## 2019-11-12 NOTE — Progress Notes (Signed)
May 6th, 2021 [place] Consult Note Telephone: [phone]  Fax: [phone]   PATIENT NAME: Stacy [name] DOB: 04/30/1956 MRN: [id number] [address] [place] [phone] Pharmacy: [facility]   PRIMARY CARE PROVIDER:   Marrian Salvage, [name]/Dr. Concepcion Living [address] [place] [address]  Dr. Vaughan Browner [phone] (pulmonary)    Services: [facility].  [facility] provides RN Adonis Brook [phone]) PT/OT. Arranging for DME supplies to substitute for those supplied by [facility] (hospital bed, BSC, shower chair).  [facility] to start nurses aid (Dr. Kimber Relic) 10/27/19 [place] ([place]) OT [phone] [facility] [phone] ([phone] bed, wheelchair w/cushion, bedside toilet, shower stool. Missing progress notes.  Home nurse [place]    REFERRING PROVIDER:  Geraldo Pitter  NP Pulmonary Disease   RESPONSIBLE PARTY: Camila Li (Daughter) [phone] (Mobile)  ASSESSMENT / RECOMMENDATIONS:  1. Advance Care Planning: A. Directives: DNR form present in home and in [place]: to breath without feeling she is smothering   2. Symptom Management:  A.Dyspnea: Progression in decline since I last saw her almost 6 weeks. earlier. She is currently on high flow oxygen via [place]; 2 oxygen concentrator machines one at 8 LPM, the other at 7LPM for combined 15 LPM. When totally still without conversing, her O2 Sats maintain 93-95%. With conversation/eating, any minimal movement she quickly desaturates to 84% with feelings of air hunger. With a little more exertion, such as transfers, she quickly desaturates to the low 70's. She uses a hand-held humidifier periodically to compensate for the nasal mucosal dryness. She is unable to lay flat and needs to have HOB up about 45 degrees when supine.  -She has Lorazepam  0.41m tab available for prn use dyspnea, but she forgot about this. I had her take a tab while I was there to reassure her that it was safe to use, and this did help her to feel less anxious.  -discussed potential use of MSO4 concentrate liquid prn dyspnea/air hunger but she is fearful of taking.  -discussed Hospice and its' services. Patient is hesitant; knows her situation is terminal but not ready to take that huge step. She also is fearful of giving up her Pirfenidone and Imuran medications for her pulmonary fibrosis, as they are non-formulary. I mentioned she could refill these just before getting onto hospice services, so she would have them available to take even if hospice couldn't pay for them.             -consider non-rebreather face mask.  -she's awaiting hospital bed (needs HOB elevated 45% to maintain saturates >88) and wheelchair (unable to ambulate without desaturations into the 70's). I checked with [employer] status of ordered electric hospital bed/wheelchair. They mentioned Dr. MRicharda Osmondoffice needs to fax over the office note to validated need for this equipment. Their fax number is [phone] Their phone number is [phone] I'll contact his office to pass on this request.  -[facility]([phone] if they have a wheel chair for donation, pnd patient acquiring from [facility]    B. Pain: The band like pain across lower chest (exacerbated with deep breath) well managed with regimen of Tylenol 6550m2 tabs bid, with interspersed Ibuprofen 2009mid. She is not taking the Tramadol as it is not covered by her insurance.  3. Family Supports: Currently staying in her daughter Carla's home; working out well. Patient has 2 daughters and 1 son. Gets great enjoyment with visits from her great granddaughter. 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 Palliative care f/u: Tues 12/07/19 @ 11am I spent 60 minutes providing this consultation from 1pm to  2pm. More than 50% of the time in this consultation was spent coordinating communication.    HISTORY OF PRESENT ILLNESS:  Stacy [name] is a 64 y.o. 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 she is azathioprine dependent. Chronic prednisone/pirfenidone), RLE DVT (Eliquis), GERD,  2/13-09/10/2019: hospitalized acute hypoxic respiratory failure d/t COVID, acute RLE DVTs, protein calorie malnutrition, anemia, anxiety, HLD, RA. *Admitted to Hospice post hospitalization, but revoked as doing better and wish to continue aggressive medical care (physician visits/medications). This is a f/u Palliative Care visit from 10/04/2019.    CODE STATUS: DNR   PPS: weak 40%   HOSPICE ELIGIBILITY/DIAGNOSIS: yes, pnd patient agreement/end stage Pulmonary Fibrosis  PAST MEDICAL HISTORY:  Past Medical History:  Diagnosis Date  . GERD (gastroesophageal reflux disease)   . Pneumonia   . Pulmonary fibrosis ([place])     SOCIAL HX:  Social History   Tobacco Use  . Smoking status: Former Smoker    Packs/day: 2.00    Types: Cigarettes    Start date: 07/1967    Quit date: 07/1993    Years since quitting: 26.3  . Smokeless tobacco: Never Used  Substance Use Topics  . Alcohol use: Yes    Comment: rarely    ALLERGIES: No Known Allergies   PERTINENT MEDICATIONS:  Outpatient Encounter Medications as of 11/11/2019  Medication Sig  . acetaminophen (TYLENOL) 500 MG tablet Take 1,000 mg by mouth every 6 (six) hours as needed for moderate pain. Take 3 tablets twice daily   . albuterol (VENTOLIN HFA) 108 (90 Base) MCG/ACT inhaler Inhale 2 puffs into the lungs every 6 (six) hours as needed for wheezing or shortness of breath.  Marland Kitchen apixaban (ELIQUIS) 5 MG TABS tablet Take 1 tablet (5 mg total) by mouth 2 (two) times daily.  Marland Kitchen azaTHIOprine (IMURAN) 50 MG tablet TAKE 3 TABLETS EVERY DAY  . ibuprofen (ADVIL) 600 MG tablet Take  1 tablet (600 mg total) by mouth 3 (three) times daily.  Marland Kitchen ketorolac (TORADOL) 10 MG tablet Take 1 tablet (10 mg total) by mouth every 6 (six) hours as needed.  Marland Kitchen LORazepam (ATIVAN) 0.5 MG tablet Take 0.5 mg by mouth every 4 (four) hours as needed.  . megestrol (MEGACE) 400 MG/10ML suspension Take 10 mLs (400 mg total) by mouth daily.  . Multiple Vitamins-Minerals (CENTRUM SILVER) tablet Take 1 tablet by mouth daily.   Marland Kitchen omeprazole (PRILOSEC) 20 MG capsule Take 1 capsule (20 mg total) by mouth daily.  Marland Kitchen oxyCODONE (OXY IR/ROXICODONE) 5 MG immediate release tablet SMARTSIG:5 Milligram(s) By Mouth Every 4 Hours PRN  . OXYGEN Inhale into the lungs. 3 liters per minute, uses when up and moving around  . Pirfenidone (ESBRIET) 267 MG TABS Take 3 tablets (801 mg total) by mouth 3 (three) times daily.  . predniSONE (DELTASONE) 5 MG tablet Take 0.5 tablets (2.5 mg total) by mouth daily. For 2 weeks then stop.  . RESTASIS 0.05 % ophthalmic emulsion   . simvastatin (ZOCOR) 20 MG tablet TAKE 1 TABLET DAILY AT 6PM (NO FURTHER REFILLS WITHOUT OFFICE VISIT) (Patient taking differently: Take  20 mg by mouth daily at 6 PM. )  . zolpidem (AMBIEN CR) 6.25 MG CR tablet Take 1 tablet (6.25 mg total) by mouth at bedtime as needed for sleep.   No facility-administered encounter medications on file as of 11/11/2019.    PHYSICAL EXAM:   Well nourish older female sitting on side of bed. Dyspnea with minimal conversation. Appears fatigued and discouraged. Granddaughter, grandson, and daughter Angela Nevin up and about in the home Pulmonary: posterior base bilateral inspiratory crackles.  Extremities: no edema, no joint deformities Skin: no rashes Neurological: Weakness but otherwise non-focal  Julianne Handler, NP

## 2019-11-16 ENCOUNTER — Telehealth: Payer: Self-pay | Admitting: Pulmonary Disease

## 2019-11-16 DIAGNOSIS — I82409 Acute embolism and thrombosis of unspecified deep veins of unspecified lower extremity: Secondary | ICD-10-CM | POA: Diagnosis not present

## 2019-11-16 DIAGNOSIS — Z8616 Personal history of COVID-19: Secondary | ICD-10-CM | POA: Diagnosis not present

## 2019-11-16 DIAGNOSIS — M810 Age-related osteoporosis without current pathological fracture: Secondary | ICD-10-CM | POA: Diagnosis not present

## 2019-11-16 DIAGNOSIS — J9611 Chronic respiratory failure with hypoxia: Secondary | ICD-10-CM

## 2019-11-16 DIAGNOSIS — J849 Interstitial pulmonary disease, unspecified: Secondary | ICD-10-CM

## 2019-11-16 DIAGNOSIS — J84112 Idiopathic pulmonary fibrosis: Secondary | ICD-10-CM | POA: Diagnosis not present

## 2019-11-16 DIAGNOSIS — K219 Gastro-esophageal reflux disease without esophagitis: Secondary | ICD-10-CM | POA: Diagnosis not present

## 2019-11-16 DIAGNOSIS — Z9981 Dependence on supplemental oxygen: Secondary | ICD-10-CM | POA: Diagnosis not present

## 2019-11-16 DIAGNOSIS — Z7901 Long term (current) use of anticoagulants: Secondary | ICD-10-CM | POA: Diagnosis not present

## 2019-11-16 NOTE — Telephone Encounter (Signed)
Ok to order shower bed

## 2019-11-16 NOTE — Telephone Encounter (Signed)
OK. Done

## 2019-11-16 NOTE — Telephone Encounter (Signed)
Dr.Mannam please advise if you have corrected the last office visit on 10/27/19.

## 2019-11-16 NOTE — Telephone Encounter (Signed)
Spoke with pt, requesting an order for a shower bench. DME: [facility].   Dr. Vaughan Browner please advise if ok to order.  Thanks!

## 2019-11-16 NOTE — Telephone Encounter (Signed)
Order placed.  Nothing further needed at this time- will close encounter.

## 2019-11-17 ENCOUNTER — Other Ambulatory Visit: Payer: Self-pay | Admitting: Pulmonary Disease

## 2019-11-18 ENCOUNTER — Telehealth: Payer: Self-pay

## 2019-11-18 DIAGNOSIS — J8410 Pulmonary fibrosis, unspecified: Secondary | ICD-10-CM

## 2019-11-18 NOTE — Telephone Encounter (Signed)
[place] w/ [facility] states that the order for the wheelchair needs to state if pt can self propel or if pt will have someone at home to help propel.   Also, the patient asked for a transfer bench.  Requesting an order for the transfer bench, as well.    Stacy [name] can be reached at [phone].

## 2019-11-18 NOTE — Telephone Encounter (Signed)
Dr. Vaughan Browner please advise if this wheelchair needs to be self propelling.  Also please advise if ok to order transfer bench.  Thanks!

## 2019-11-19 ENCOUNTER — Telehealth: Payer: Self-pay | Admitting: Pulmonary Disease

## 2019-11-19 DIAGNOSIS — M810 Age-related osteoporosis without current pathological fracture: Secondary | ICD-10-CM | POA: Diagnosis not present

## 2019-11-19 DIAGNOSIS — Z9981 Dependence on supplemental oxygen: Secondary | ICD-10-CM | POA: Diagnosis not present

## 2019-11-19 DIAGNOSIS — J84112 Idiopathic pulmonary fibrosis: Secondary | ICD-10-CM | POA: Diagnosis not present

## 2019-11-19 DIAGNOSIS — Z8616 Personal history of COVID-19: Secondary | ICD-10-CM | POA: Diagnosis not present

## 2019-11-19 DIAGNOSIS — J849 Interstitial pulmonary disease, unspecified: Secondary | ICD-10-CM | POA: Diagnosis not present

## 2019-11-19 DIAGNOSIS — Z7901 Long term (current) use of anticoagulants: Secondary | ICD-10-CM | POA: Diagnosis not present

## 2019-11-19 DIAGNOSIS — J9611 Chronic respiratory failure with hypoxia: Secondary | ICD-10-CM | POA: Diagnosis not present

## 2019-11-19 DIAGNOSIS — I82409 Acute embolism and thrombosis of unspecified deep veins of unspecified lower extremity: Secondary | ICD-10-CM | POA: Diagnosis not present

## 2019-11-19 DIAGNOSIS — K219 Gastro-esophageal reflux disease without esophagitis: Secondary | ICD-10-CM | POA: Diagnosis not present

## 2019-11-19 NOTE — Addendum Note (Signed)
Addended by: Rosana Berger on: 11/19/2019 04:43 PM   Modules accepted: Orders

## 2019-11-19 NOTE — Telephone Encounter (Signed)
Please order self propelling, ok for transfer bench order

## 2019-11-19 NOTE — Telephone Encounter (Signed)
Done  Order sent

## 2019-11-19 NOTE — Telephone Encounter (Signed)
Charisse calling to get verbal orders to extend her pt for 1week/1 starting 5/31. Charisse can be reached at [phone]. Had her first vaccine shot on 5/13 and she having problems, when she sneeze and goes to the bathroom her o2 sats to the 70's and she is wearing her oxygen, she resting now hr 87 and o2 is on 97. When exerting it goes up to 123

## 2019-11-19 NOTE — Telephone Encounter (Signed)
I called and spoke with Charisse. She states pt had her first covid vaccine on 11/18/2019. She is now having increased SOB today and her sats drop into the 70's on 13lpm with minimal exertion such as walking to the bathroom, coughing or sneezing. She states that she is feeling very tired and just not well in general. I advised that with sats in the 70's on 13lpm may need to go to ED for eval. She denies any fever. Temp today was 97. Please advise thanks

## 2019-11-19 NOTE — Telephone Encounter (Signed)
Spoke with Stacy [name]  I made her aware that we have sent msg to Dr Vaughan Browner to ask him about the wheelchair and transfer bench and that we are working on this  Nothing further needed

## 2019-11-19 NOTE — Telephone Encounter (Signed)
I called and spoke with patient.  She would like to avoid going to the emergency room Currently her sats are low 90s on supplemental oxygen Advised her to stay well rested, hydrated.  If she worsens then reconsider emergency room visit.

## 2019-11-22 ENCOUNTER — Other Ambulatory Visit: Payer: Self-pay | Admitting: Primary Care

## 2019-11-22 ENCOUNTER — Telehealth: Payer: Self-pay | Admitting: Pulmonary Disease

## 2019-11-22 MED ORDER — PREDNISONE 5 MG PO TABS: 5.0000 mg | ORAL_TABLET | Freq: Every day | ORAL | 2 refills | Status: DC

## 2019-11-22 NOTE — Telephone Encounter (Signed)
Called and spoke with patient. She states she would like a refill on her prednisone 80m and her ibuprofen 603m  THe last phone note from Dr. MaVaughan Brownertates can be on the 66m50mf prednisone. Rx sent in.   Dr. ManVaughan Brownern the ibuprofen be refilled , if so how many and do you want refills?

## 2019-11-23 DIAGNOSIS — I82409 Acute embolism and thrombosis of unspecified deep veins of unspecified lower extremity: Secondary | ICD-10-CM | POA: Diagnosis not present

## 2019-11-23 DIAGNOSIS — M810 Age-related osteoporosis without current pathological fracture: Secondary | ICD-10-CM | POA: Diagnosis not present

## 2019-11-23 DIAGNOSIS — Z7901 Long term (current) use of anticoagulants: Secondary | ICD-10-CM | POA: Diagnosis not present

## 2019-11-23 DIAGNOSIS — J84112 Idiopathic pulmonary fibrosis: Secondary | ICD-10-CM | POA: Diagnosis not present

## 2019-11-23 DIAGNOSIS — J849 Interstitial pulmonary disease, unspecified: Secondary | ICD-10-CM | POA: Diagnosis not present

## 2019-11-23 DIAGNOSIS — Z9981 Dependence on supplemental oxygen: Secondary | ICD-10-CM | POA: Diagnosis not present

## 2019-11-23 DIAGNOSIS — Z8616 Personal history of COVID-19: Secondary | ICD-10-CM | POA: Diagnosis not present

## 2019-11-23 DIAGNOSIS — K219 Gastro-esophageal reflux disease without esophagitis: Secondary | ICD-10-CM | POA: Diagnosis not present

## 2019-11-23 DIAGNOSIS — J9611 Chronic respiratory failure with hypoxia: Secondary | ICD-10-CM | POA: Diagnosis not present

## 2019-11-23 MED ORDER — IBUPROFEN 200 MG PO TABS: 200.0000 mg | ORAL_TABLET | Freq: Three times a day (TID) | ORAL | 3 refills | Status: DC | PRN

## 2019-11-23 NOTE — Telephone Encounter (Signed)
I have sent in the order

## 2019-11-24 ENCOUNTER — Telehealth: Payer: Self-pay | Admitting: Pulmonary Disease

## 2019-11-24 DIAGNOSIS — Z8616 Personal history of COVID-19: Secondary | ICD-10-CM | POA: Diagnosis not present

## 2019-11-24 DIAGNOSIS — J9611 Chronic respiratory failure with hypoxia: Secondary | ICD-10-CM | POA: Diagnosis not present

## 2019-11-24 DIAGNOSIS — M810 Age-related osteoporosis without current pathological fracture: Secondary | ICD-10-CM | POA: Diagnosis not present

## 2019-11-24 DIAGNOSIS — K219 Gastro-esophageal reflux disease without esophagitis: Secondary | ICD-10-CM | POA: Diagnosis not present

## 2019-11-24 DIAGNOSIS — Z9981 Dependence on supplemental oxygen: Secondary | ICD-10-CM | POA: Diagnosis not present

## 2019-11-24 DIAGNOSIS — J849 Interstitial pulmonary disease, unspecified: Secondary | ICD-10-CM | POA: Diagnosis not present

## 2019-11-24 DIAGNOSIS — Z7901 Long term (current) use of anticoagulants: Secondary | ICD-10-CM | POA: Diagnosis not present

## 2019-11-24 DIAGNOSIS — J84112 Idiopathic pulmonary fibrosis: Secondary | ICD-10-CM | POA: Diagnosis not present

## 2019-11-24 DIAGNOSIS — I82409 Acute embolism and thrombosis of unspecified deep veins of unspecified lower extremity: Secondary | ICD-10-CM | POA: Diagnosis not present

## 2019-11-24 NOTE — Telephone Encounter (Signed)
Dr. Vaughan Browner, please advise if you are okay with [place] providing the requested verbal orders.

## 2019-11-24 NOTE — Telephone Encounter (Signed)
Dr. Vaughan Browner needs to do an addendum to his note stating reason for ordering  transfer bench and also self propelling wheelchair. The only thing that was done was a new order was placed for the same thing he hasn't updated his note yet. That is what [employer] needs to process the claim

## 2019-11-24 NOTE — Telephone Encounter (Signed)
Here is Leah's response see below  Theodora Blow, Ferd Hibbs Georges Mouse; 1 other   Hello Rodena Piety!   Thanks for this order however we already have the order for both the transfer bench and the wheelchair. What we are missing is the progress not inside the office visit notes that explain the medical need for the wheelchair. I have attached and example below:   "Patient suffers from _____ which impairs their ability to perform daily activities like (dressing, grooming, bathing) in the home. A cane, walker or crutch will not resolve issues. A wheelchair will allow the patient to safely perform daily activities. Patient can safely self-propel the wheelchair in the home or has a caregiver that can provide assistance."   Please have the office visit notes amended to include something like this and then we can move forward with supplying the wheelchair to the patient.

## 2019-11-24 NOTE — Telephone Encounter (Signed)
Dr. Vaughan Browner please addend your office note for this patient from 10/27/19 with this statement  ""Patient suffers from _____ which impairs their ability to perform daily activities like (dressing, grooming, bathing) in the home. A cane, walker or crutch will not resolve issues. A wheelchair will allow the patient to safely perform daily activities. Patient can safely self-propel the wheelchair in the home or has a caregiver that can provide assistance.""  Then we can fax addended office note to [employer] so patient can get their equipment

## 2019-11-25 DIAGNOSIS — Z9981 Dependence on supplemental oxygen: Secondary | ICD-10-CM | POA: Diagnosis not present

## 2019-11-25 DIAGNOSIS — J84112 Idiopathic pulmonary fibrosis: Secondary | ICD-10-CM | POA: Diagnosis not present

## 2019-11-25 DIAGNOSIS — J9611 Chronic respiratory failure with hypoxia: Secondary | ICD-10-CM | POA: Diagnosis not present

## 2019-11-25 DIAGNOSIS — M810 Age-related osteoporosis without current pathological fracture: Secondary | ICD-10-CM | POA: Diagnosis not present

## 2019-11-25 DIAGNOSIS — K219 Gastro-esophageal reflux disease without esophagitis: Secondary | ICD-10-CM | POA: Diagnosis not present

## 2019-11-25 DIAGNOSIS — Z8616 Personal history of COVID-19: Secondary | ICD-10-CM | POA: Diagnosis not present

## 2019-11-25 DIAGNOSIS — J849 Interstitial pulmonary disease, unspecified: Secondary | ICD-10-CM | POA: Diagnosis not present

## 2019-11-25 DIAGNOSIS — I82409 Acute embolism and thrombosis of unspecified deep veins of unspecified lower extremity: Secondary | ICD-10-CM | POA: Diagnosis not present

## 2019-11-25 DIAGNOSIS — Z7901 Long term (current) use of anticoagulants: Secondary | ICD-10-CM | POA: Diagnosis not present

## 2019-11-25 NOTE — Telephone Encounter (Signed)
Called OT gave verbal order over VM as requested. Told to call office if anything further was needed.

## 2019-11-25 NOTE — Telephone Encounter (Signed)
OK to give verbal order

## 2019-11-26 DIAGNOSIS — M810 Age-related osteoporosis without current pathological fracture: Secondary | ICD-10-CM | POA: Diagnosis not present

## 2019-11-26 DIAGNOSIS — J849 Interstitial pulmonary disease, unspecified: Secondary | ICD-10-CM | POA: Diagnosis not present

## 2019-11-26 DIAGNOSIS — K219 Gastro-esophageal reflux disease without esophagitis: Secondary | ICD-10-CM | POA: Diagnosis not present

## 2019-11-26 DIAGNOSIS — Z7901 Long term (current) use of anticoagulants: Secondary | ICD-10-CM | POA: Diagnosis not present

## 2019-11-26 DIAGNOSIS — J9611 Chronic respiratory failure with hypoxia: Secondary | ICD-10-CM | POA: Diagnosis not present

## 2019-11-26 DIAGNOSIS — I82409 Acute embolism and thrombosis of unspecified deep veins of unspecified lower extremity: Secondary | ICD-10-CM | POA: Diagnosis not present

## 2019-11-26 DIAGNOSIS — J84112 Idiopathic pulmonary fibrosis: Secondary | ICD-10-CM | POA: Diagnosis not present

## 2019-11-26 DIAGNOSIS — Z9981 Dependence on supplemental oxygen: Secondary | ICD-10-CM | POA: Diagnosis not present

## 2019-11-26 DIAGNOSIS — Z8616 Personal history of COVID-19: Secondary | ICD-10-CM | POA: Diagnosis not present

## 2019-11-28 ENCOUNTER — Other Ambulatory Visit: Payer: Self-pay | Admitting: Primary Care

## 2019-11-28 DIAGNOSIS — J84112 Idiopathic pulmonary fibrosis: Secondary | ICD-10-CM

## 2019-11-29 ENCOUNTER — Telehealth: Payer: Self-pay | Admitting: Pulmonary Disease

## 2019-11-29 NOTE — Telephone Encounter (Signed)
ATC Leah at [employer] to follow up on message about medical equipment. Dr. Vaughan Browner needed to addend his last office note. You can refer to phone note from 5/19. Last office note has been addended. I called Leah to see if she needed it to be faxed over or how she wanted to proceed. LMTCB   Needed to be put in OV note:    "Patient suffers from _____ which impairs their ability to perform daily activities like (dressing, grooming, bathing) in the home. A cane, walker or crutch will not resolve issues. A wheelchair will allow the patient to safely perform daily activities. Patient can safely self-propel the wheelchair in the home or has a caregiver that can provide assistance."

## 2019-11-30 ENCOUNTER — Telehealth: Payer: Self-pay | Admitting: Pulmonary Disease

## 2019-11-30 ENCOUNTER — Ambulatory Visit: Payer: Medicare HMO | Admitting: Family

## 2019-11-30 DIAGNOSIS — J45909 Unspecified asthma, uncomplicated: Secondary | ICD-10-CM | POA: Diagnosis not present

## 2019-11-30 DIAGNOSIS — Z8616 Personal history of COVID-19: Secondary | ICD-10-CM | POA: Diagnosis not present

## 2019-11-30 DIAGNOSIS — J849 Interstitial pulmonary disease, unspecified: Secondary | ICD-10-CM | POA: Diagnosis not present

## 2019-11-30 DIAGNOSIS — M810 Age-related osteoporosis without current pathological fracture: Secondary | ICD-10-CM | POA: Diagnosis not present

## 2019-11-30 DIAGNOSIS — Z7901 Long term (current) use of anticoagulants: Secondary | ICD-10-CM | POA: Diagnosis not present

## 2019-11-30 DIAGNOSIS — J449 Chronic obstructive pulmonary disease, unspecified: Secondary | ICD-10-CM | POA: Diagnosis not present

## 2019-11-30 DIAGNOSIS — J8410 Pulmonary fibrosis, unspecified: Secondary | ICD-10-CM | POA: Diagnosis not present

## 2019-11-30 DIAGNOSIS — J9611 Chronic respiratory failure with hypoxia: Secondary | ICD-10-CM | POA: Diagnosis not present

## 2019-11-30 DIAGNOSIS — K219 Gastro-esophageal reflux disease without esophagitis: Secondary | ICD-10-CM | POA: Diagnosis not present

## 2019-11-30 DIAGNOSIS — J9601 Acute respiratory failure with hypoxia: Secondary | ICD-10-CM | POA: Diagnosis not present

## 2019-11-30 DIAGNOSIS — I82409 Acute embolism and thrombosis of unspecified deep veins of unspecified lower extremity: Secondary | ICD-10-CM | POA: Diagnosis not present

## 2019-11-30 DIAGNOSIS — Z9981 Dependence on supplemental oxygen: Secondary | ICD-10-CM | POA: Diagnosis not present

## 2019-11-30 DIAGNOSIS — J84112 Idiopathic pulmonary fibrosis: Secondary | ICD-10-CM | POA: Diagnosis not present

## 2019-11-30 NOTE — Telephone Encounter (Signed)
Stacy [name] is calling about Lorazepam prior authorization. Has clinical questions. Reference number is [id number]. [place] phone number is [phone].

## 2019-11-30 NOTE — Telephone Encounter (Signed)
Attempted to do prior authorization, already on file.

## 2019-11-30 NOTE — Telephone Encounter (Signed)
ATC Leah again to touch base LMTCB

## 2019-11-30 NOTE — Telephone Encounter (Signed)
I have sent a message to [employer] for them to pull the note and process

## 2019-11-30 NOTE — Telephone Encounter (Signed)
Stacy [name] called back and stated that there is nothing that they need from [place] and that equipment has been ordered for the patient. Nothing further needed at this time.

## 2019-11-30 NOTE — Telephone Encounter (Signed)
Done

## 2019-11-30 NOTE — Telephone Encounter (Signed)
Received a PA request for this patient for a rx of Lorazepam 0.21m tab.  I see this listed as a historical med on pt's current med list, but I do not see where this has ever been prescribed by our office.  Also reviewed last office note, and this med was not mentioned in note.    Dr. MVaughan Brownerplease advise if you are willing to prescribe this med for this pt and proceed with the PA, or if this needs to be forwarded to pt's PCP.  Thanks!

## 2019-12-01 DIAGNOSIS — H04123 Dry eye syndrome of bilateral lacrimal glands: Secondary | ICD-10-CM | POA: Diagnosis not present

## 2019-12-01 DIAGNOSIS — Z961 Presence of intraocular lens: Secondary | ICD-10-CM | POA: Diagnosis not present

## 2019-12-02 DIAGNOSIS — Z8616 Personal history of COVID-19: Secondary | ICD-10-CM | POA: Diagnosis not present

## 2019-12-02 DIAGNOSIS — M810 Age-related osteoporosis without current pathological fracture: Secondary | ICD-10-CM | POA: Diagnosis not present

## 2019-12-02 DIAGNOSIS — I82409 Acute embolism and thrombosis of unspecified deep veins of unspecified lower extremity: Secondary | ICD-10-CM | POA: Diagnosis not present

## 2019-12-02 DIAGNOSIS — K219 Gastro-esophageal reflux disease without esophagitis: Secondary | ICD-10-CM | POA: Diagnosis not present

## 2019-12-02 DIAGNOSIS — J849 Interstitial pulmonary disease, unspecified: Secondary | ICD-10-CM | POA: Diagnosis not present

## 2019-12-02 DIAGNOSIS — Z9981 Dependence on supplemental oxygen: Secondary | ICD-10-CM | POA: Diagnosis not present

## 2019-12-02 DIAGNOSIS — Z7901 Long term (current) use of anticoagulants: Secondary | ICD-10-CM | POA: Diagnosis not present

## 2019-12-02 DIAGNOSIS — J84112 Idiopathic pulmonary fibrosis: Secondary | ICD-10-CM | POA: Diagnosis not present

## 2019-12-02 DIAGNOSIS — J9611 Chronic respiratory failure with hypoxia: Secondary | ICD-10-CM | POA: Diagnosis not present

## 2019-12-02 NOTE — Telephone Encounter (Signed)
Not sure who prescribed lorazepam.  I will advise her not to use it as it may cause suppression of breathing.

## 2019-12-02 NOTE — Telephone Encounter (Signed)
Patient called and instructed not to take lorazepam due to suppression of breathing. Patient verbalized understanding and took the med out of her regular meds. PA for lorazepam should be cancelled.

## 2019-12-02 NOTE — Telephone Encounter (Signed)
Spoke with Clifton James at [place] and [place] has been canceled. Patient is aware to not take medication. Nothing further needed at this time.

## 2019-12-03 ENCOUNTER — Other Ambulatory Visit: Payer: Self-pay | Admitting: Pulmonary Disease

## 2019-12-03 DIAGNOSIS — J9611 Chronic respiratory failure with hypoxia: Secondary | ICD-10-CM | POA: Diagnosis not present

## 2019-12-03 DIAGNOSIS — K219 Gastro-esophageal reflux disease without esophagitis: Secondary | ICD-10-CM | POA: Diagnosis not present

## 2019-12-03 DIAGNOSIS — I82409 Acute embolism and thrombosis of unspecified deep veins of unspecified lower extremity: Secondary | ICD-10-CM | POA: Diagnosis not present

## 2019-12-03 DIAGNOSIS — Z7901 Long term (current) use of anticoagulants: Secondary | ICD-10-CM | POA: Diagnosis not present

## 2019-12-03 DIAGNOSIS — Z8616 Personal history of COVID-19: Secondary | ICD-10-CM | POA: Diagnosis not present

## 2019-12-03 DIAGNOSIS — M810 Age-related osteoporosis without current pathological fracture: Secondary | ICD-10-CM | POA: Diagnosis not present

## 2019-12-03 DIAGNOSIS — J84112 Idiopathic pulmonary fibrosis: Secondary | ICD-10-CM | POA: Diagnosis not present

## 2019-12-03 DIAGNOSIS — Z9981 Dependence on supplemental oxygen: Secondary | ICD-10-CM | POA: Diagnosis not present

## 2019-12-03 DIAGNOSIS — J449 Chronic obstructive pulmonary disease, unspecified: Secondary | ICD-10-CM | POA: Diagnosis not present

## 2019-12-03 DIAGNOSIS — J849 Interstitial pulmonary disease, unspecified: Secondary | ICD-10-CM | POA: Diagnosis not present

## 2019-12-03 MED ORDER — PREDNISONE 5 MG PO TABS: 2.5000 mg | ORAL_TABLET | Freq: Every day | ORAL | 1 refills | Status: DC

## 2019-12-07 ENCOUNTER — Other Ambulatory Visit: Payer: Self-pay

## 2019-12-07 ENCOUNTER — Other Ambulatory Visit: Payer: Medicare HMO | Admitting: Internal Medicine

## 2019-12-07 DIAGNOSIS — J9611 Chronic respiratory failure with hypoxia: Secondary | ICD-10-CM | POA: Diagnosis not present

## 2019-12-07 DIAGNOSIS — J84112 Idiopathic pulmonary fibrosis: Secondary | ICD-10-CM | POA: Diagnosis not present

## 2019-12-07 DIAGNOSIS — J849 Interstitial pulmonary disease, unspecified: Secondary | ICD-10-CM | POA: Diagnosis not present

## 2019-12-07 DIAGNOSIS — M810 Age-related osteoporosis without current pathological fracture: Secondary | ICD-10-CM | POA: Diagnosis not present

## 2019-12-07 DIAGNOSIS — Z7901 Long term (current) use of anticoagulants: Secondary | ICD-10-CM | POA: Diagnosis not present

## 2019-12-07 DIAGNOSIS — K219 Gastro-esophageal reflux disease without esophagitis: Secondary | ICD-10-CM | POA: Diagnosis not present

## 2019-12-07 DIAGNOSIS — I82409 Acute embolism and thrombosis of unspecified deep veins of unspecified lower extremity: Secondary | ICD-10-CM | POA: Diagnosis not present

## 2019-12-07 DIAGNOSIS — Z9981 Dependence on supplemental oxygen: Secondary | ICD-10-CM | POA: Diagnosis not present

## 2019-12-07 DIAGNOSIS — Z8616 Personal history of COVID-19: Secondary | ICD-10-CM | POA: Diagnosis not present

## 2019-12-08 ENCOUNTER — Telehealth: Payer: Self-pay | Admitting: Pulmonary Disease

## 2019-12-08 NOTE — Telephone Encounter (Signed)
FYI for Mannam- pt missed at OT appt this wk, she has another one scheduled for this wk 6/4

## 2019-12-09 ENCOUNTER — Other Ambulatory Visit: Payer: Self-pay

## 2019-12-09 ENCOUNTER — Other Ambulatory Visit: Payer: Medicare HMO | Admitting: Internal Medicine

## 2019-12-09 ENCOUNTER — Encounter: Payer: Self-pay | Admitting: Internal Medicine

## 2019-12-09 DIAGNOSIS — Z7189 Other specified counseling: Secondary | ICD-10-CM | POA: Diagnosis not present

## 2019-12-09 DIAGNOSIS — Z515 Encounter for palliative care: Secondary | ICD-10-CM

## 2019-12-09 NOTE — Progress Notes (Addendum)
June 3rd, 2021 [facility] Palliative Care Consult Note Telephone: [phone]  Fax: [phone]   PATIENT NAME: Stacy [name] DOB: 12/24/1955 MRN: [id number] [address] [place] [phone] Pharmacy: [facility] [address] [place] [address]  [phone] Phone: [phone]  Fax: [phone]  NCPDPID: [id number]  PRIMARY CARE PROVIDER:   Marrian Salvage, [name]/Dr. Concepcion [name] [address] [place] [address]   Dr. Vaughan [name] [phone] (pulmonary)  Services: [facility].     REFERRING PROVIDER:  Geraldo Pitter NP Pulmonary Disease   RESPONSIBLE PARTY: Stacy [name] (Daughter) [phone] (Mobile)    ASSESSMENT / RECOMMENDATIONS:  1. Advance Care Planning: A. Directives: DNR form present in home and in [place]: to breath without feeling she is smothering  -We again discussed Hospice and its services. I mentioned that she could still see her specialists, but some of her medications would not be covered. I mentioned that she might consider hospice if she no longer wished to go to the hospital should she decline and felt continuation of the more expensive of her meds could be sacrificed, to gain use of medications used for symptom management. -Mentions that the biggest joy in her life are the almost daily visits form her 76monthold granddaughter.   2. Symptom Management: A. Dyspnea: Further decline in respiratory status, from my last visit 4 weeks earlier. Continues high flow O2 via [place] with 2 compressors, one at 8 LPM and the other at 7 LPM. This maintains her pulse Ox sats low to mid 90's, but she promptly desaturates to the 70's-80's with conversation, eating, anxiety, or transferring to bed side commode. She now needs assist from her daughter for dressing, hygiene, and wiping off after toileting d/t severe dyspnea. She will on  rare occasion attempt to ambulate to [name] room with rollator walker but had to sit and rest every few steps. She has a frightening occurrence when going to the [place] yesterday for her 2nd COVID shot. Her tank needed changing out and the small seal from the regulator was lost, so unable to hook onto a new tank. Luckily EMS was on site and were able to assist with back up O2 till the situation was corrected. She did have some Lorazepam available at that time, so this was helpful as she was greatly panicked. She has a coughing fit this am with desaturation to 63%; took her some minutes to recover.   -Giver end stage nature of disease, I feel the benefit outweighs the risk of a prn low dose benzo to assist her during episodes of air hunger. Patient mentions that Dr. MVaughan Brownerdoesn't wish to prescribe d/t potential respiratory suppression. She plans to readdress this at her next OV with him.  B. Poor appetite: Must force herself to eat. Feels nauseated if pushes too much. Does enjoy fruit. Megace on her profile but insurance doesn't cover. I'll start Remeron. 163m -1 tab qhs (check; insurance covered. I e-prescribed). Hopefully will assist her with her insomnia as well.   C. Insomnia: Got a great night's sleep over the last month with use of Ambien qhs. However, not sure she can continue d/t $49 co-pay. She did use Vistaril in the past to good effect. If the Remeron doesn't help her sleep I'll reorder the Vistaril.   D. Constipation: She has senna available but forgot about.  She took two tabs while I was there. She can take 1-2 tabs, titrate up to 3 tabs bid to effect.   E. Pain: Continues band like pain across lower chest (exacerbated with deep breath) well managed with regimen of Tylenol 669m 2 tabs bid, with interspersed Ibuprofen 6057mbid. She is not taking the Tramadol as it is not covered by her insurance. She plans to trial decreasing ibuprofen dose (has 20036mabs available).   3. Family /  Community Supports: Currently staying in her daughter Stacy [name]'s home; working out well. Patient has 2 daughters and 1 son. Gets great enjoyment with visits from her great granddaughter. Past employment as on uphRisk managerndGlean HesshaJosiah LoboW [id number]; is trying to get a Medicare aide 3x/week.     4. Palliative care f/u Thurs 01/13/2020 @ 1pm  I spent 60 minutes providing this consultation from 11am - 12:30 pm. More than 50% of the time in this consultation was spent coordinating communication.    HISTORY OF PRESENT ILLNESS:  [name] a [age]o. 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 she is azathioprine dependent. Chronic prednisone/pirfenidone), RLE DVT (Eliquis), GERD,  2/13-09/10/2019: hospitalized acute hypoxic respiratory failure d/t COVID, acute RLE DVTs, protein calorie malnutrition, anemia, anxiety, HLD, RA. *Admitted to Hospice post hospitalization, but revoked as doing better and wish to continue aggressive medical care (physician visits/medications). This is a f/u Palliative Care visit from 10/04/2019.    CODE STATUS: DNR   PPS: weak 40%   HOSPICE ELIGIBILITY/DIAGNOSIS: yes, pnd patient agreement/end stage Pulmonary Fibrosis  PAST MEDICAL HISTORY:  Past Medical History:  Diagnosis Date  . GERD (gastroesophageal reflux disease)   . Pneumonia   . Pulmonary fibrosis (HCCWaldorf   SOCIAL HX:  Social History   Tobacco Use  . Smoking status: Former Smoker    Packs/day: 2.00    Types: Cigarettes    Start date: 07/1967    Quit date: 07/1993    Years since quitting: 26.4  . Smokeless tobacco: Never Used  Substance Use Topics  . Alcohol use: Yes    Comment: rarely    ALLERGIES: No Known Allergies   PERTINENT MEDICATIONS:  Outpatient Encounter Medications as of 12/09/2019  Medication Sig  . acetaminophen (TYLENOL)  500 MG tablet Take 1,000 mg by mouth every 6 (six) hours as needed for moderate pain. Take 2 tablets twice daily  . albuterol (VENTOLIN HFA) 108 (90 Base) MCG/ACT inhaler Inhale 2 puffs into the lungs every 6 (six) hours as needed for wheezing or shortness of breath.  . azaTHIOprine (IMURAN) 50 MG tablet TAKE 3 TABLETS EVERY DAY  . ELIQUIS 5 MG TABS tablet TAKE 1 TABLET TWICE DAILY  . ESBRIET 267 MG TABS TAKE 3 TABLETS (801 MG TOTAL) BY MOUTH 3 TIMES DAILY.  . iMarland Kitchenuprofen (ADVIL) 200 MG tablet Take 1 tablet (200 mg total) by mouth every 8 (eight) hours as needed.  . kMarland Kitchentorolac (TORADOL) 10 MG tablet Take 1 tablet (10 mg total) by mouth every 6 (six) hours as needed. (Patient not taking: Reported on 11/12/2019)  . LORazepam (ATIVAN) 0.5 MG tablet Take 0.5 mg by mouth every 4 (four) hours as needed.  . megestrol (MEGACE) 400 MG/10ML suspension Take 10 mLs (400 mg total) by mouth daily. (Patient not taking: Reported on 11/12/2019)  . Multiple Vitamins-Minerals (CENTRUM SILVER) tablet Take 1  tablet by mouth daily.   Marland Kitchen omeprazole (PRILOSEC) 20 MG capsule Take 1 capsule (20 mg total) by mouth daily.  Marland Kitchen oxyCODONE (OXY IR/ROXICODONE) 5 MG immediate release tablet SMARTSIG:5 Milligram(s) By Mouth Every 4 Hours PRN  . OXYGEN Inhale into the lungs. 15 liters per minute (two compressors; one at 7 LPM and one at 8LPM  . predniSONE (DELTASONE) 5 MG tablet Take 1 tablet (5 mg total) by mouth daily with breakfast.  . predniSONE (DELTASONE) 5 MG tablet Take 0.5 tablets (2.5 mg total) by mouth daily. For 2 weeks then stop.  . RESTASIS 0.05 % ophthalmic emulsion   . simvastatin (ZOCOR) 20 MG tablet TAKE 1 TABLET DAILY AT 6PM (NO FURTHER REFILLS WITHOUT OFFICE VISIT) (Patient taking differently: Take 20 mg by mouth daily at 6 PM. )  . zolpidem (AMBIEN CR) 6.25 MG CR tablet Take 1 tablet (6.25 mg total) by mouth at bedtime as needed for sleep.   No facility-administered encounter medications on file as of 12/09/2019.     Julianne Handler, NP

## 2019-12-10 ENCOUNTER — Telehealth: Payer: Self-pay | Admitting: Pulmonary Disease

## 2019-12-10 DIAGNOSIS — M810 Age-related osteoporosis without current pathological fracture: Secondary | ICD-10-CM | POA: Diagnosis not present

## 2019-12-10 DIAGNOSIS — Z7901 Long term (current) use of anticoagulants: Secondary | ICD-10-CM | POA: Diagnosis not present

## 2019-12-10 DIAGNOSIS — J849 Interstitial pulmonary disease, unspecified: Secondary | ICD-10-CM | POA: Diagnosis not present

## 2019-12-10 DIAGNOSIS — J84112 Idiopathic pulmonary fibrosis: Secondary | ICD-10-CM | POA: Diagnosis not present

## 2019-12-10 DIAGNOSIS — Z9981 Dependence on supplemental oxygen: Secondary | ICD-10-CM | POA: Diagnosis not present

## 2019-12-10 DIAGNOSIS — K219 Gastro-esophageal reflux disease without esophagitis: Secondary | ICD-10-CM | POA: Diagnosis not present

## 2019-12-10 DIAGNOSIS — I82409 Acute embolism and thrombosis of unspecified deep veins of unspecified lower extremity: Secondary | ICD-10-CM | POA: Diagnosis not present

## 2019-12-10 DIAGNOSIS — J9611 Chronic respiratory failure with hypoxia: Secondary | ICD-10-CM | POA: Diagnosis not present

## 2019-12-10 DIAGNOSIS — Z8616 Personal history of COVID-19: Secondary | ICD-10-CM | POA: Diagnosis not present

## 2019-12-10 NOTE — Telephone Encounter (Signed)
Called and spoke with Stacy [name]. He is aware that Dr. Vaughan Browner is ok with extending the OT for 6 weeks. Verbalized understanding.   Nothing further needed at time of call.

## 2019-12-10 NOTE — Telephone Encounter (Signed)
Yes.  That is fine.  Thanks

## 2019-12-10 NOTE — Telephone Encounter (Signed)
I called [facility] but there was no answer. Advised him to return call. Dr. Vaughan Browner can we give verbal order for PT once a week for 6 weeks? Please advise.

## 2019-12-17 ENCOUNTER — Other Ambulatory Visit: Payer: Self-pay | Admitting: *Deleted

## 2019-12-17 ENCOUNTER — Telehealth: Payer: Self-pay | Admitting: *Deleted

## 2019-12-17 DIAGNOSIS — J849 Interstitial pulmonary disease, unspecified: Secondary | ICD-10-CM | POA: Diagnosis not present

## 2019-12-17 DIAGNOSIS — Z8616 Personal history of COVID-19: Secondary | ICD-10-CM | POA: Diagnosis not present

## 2019-12-17 DIAGNOSIS — Z9981 Dependence on supplemental oxygen: Secondary | ICD-10-CM | POA: Diagnosis not present

## 2019-12-17 DIAGNOSIS — K219 Gastro-esophageal reflux disease without esophagitis: Secondary | ICD-10-CM | POA: Diagnosis not present

## 2019-12-17 DIAGNOSIS — J9611 Chronic respiratory failure with hypoxia: Secondary | ICD-10-CM | POA: Diagnosis not present

## 2019-12-17 DIAGNOSIS — J84112 Idiopathic pulmonary fibrosis: Secondary | ICD-10-CM | POA: Diagnosis not present

## 2019-12-17 DIAGNOSIS — I82409 Acute embolism and thrombosis of unspecified deep veins of unspecified lower extremity: Secondary | ICD-10-CM | POA: Diagnosis not present

## 2019-12-17 MED ORDER — IBUPROFEN 600 MG PO TABS: 600.0000 mg | ORAL_TABLET | Freq: Three times a day (TID) | ORAL | 1 refills | Status: AC | PRN

## 2019-12-20 ENCOUNTER — Other Ambulatory Visit (HOSPITAL_COMMUNITY)
Admission: RE | Admit: 2019-12-20 | Discharge: 2019-12-20 | Disposition: A | Discharge status: Home / Self Care | Payer: Medicare HMO | Source: Ambulatory Visit | Attending: Pulmonary Disease | Admitting: Pulmonary Disease

## 2019-12-20 DIAGNOSIS — Z20822 Contact with and (suspected) exposure to covid-19: Secondary | ICD-10-CM | POA: Diagnosis not present

## 2019-12-20 DIAGNOSIS — Z01812 Encounter for preprocedural laboratory examination: Secondary | ICD-10-CM | POA: Diagnosis not present

## 2019-12-20 LAB — SARS CORONAVIRUS 2 (TAT 6-24 HRS): SARS Coronavirus 2: NEGATIVE

## 2019-12-20 MED ORDER — PREDNISONE 5 MG PO TABS: 5.0000 mg | ORAL_TABLET | Freq: Every day | ORAL | 0 refills | Status: AC

## 2019-12-20 NOTE — Telephone Encounter (Signed)
RX has been sent in.

## 2019-12-20 NOTE — Telephone Encounter (Signed)
She did not tolerate a reduction in prednisone dose we have kept it at 5 mg/day.  Please refill prednisone 56m/day

## 2019-12-21 ENCOUNTER — Telehealth: Payer: Self-pay | Admitting: Pulmonary Disease

## 2019-12-21 ENCOUNTER — Other Ambulatory Visit: Payer: Self-pay | Admitting: *Deleted

## 2019-12-21 DIAGNOSIS — J849 Interstitial pulmonary disease, unspecified: Secondary | ICD-10-CM | POA: Diagnosis not present

## 2019-12-21 DIAGNOSIS — J8410 Pulmonary fibrosis, unspecified: Secondary | ICD-10-CM

## 2019-12-21 DIAGNOSIS — Z9981 Dependence on supplemental oxygen: Secondary | ICD-10-CM | POA: Diagnosis not present

## 2019-12-21 DIAGNOSIS — J9611 Chronic respiratory failure with hypoxia: Secondary | ICD-10-CM | POA: Diagnosis not present

## 2019-12-21 DIAGNOSIS — I82409 Acute embolism and thrombosis of unspecified deep veins of unspecified lower extremity: Secondary | ICD-10-CM | POA: Diagnosis not present

## 2019-12-21 DIAGNOSIS — K219 Gastro-esophageal reflux disease without esophagitis: Secondary | ICD-10-CM | POA: Diagnosis not present

## 2019-12-21 DIAGNOSIS — J84112 Idiopathic pulmonary fibrosis: Secondary | ICD-10-CM | POA: Diagnosis not present

## 2019-12-21 DIAGNOSIS — Z8616 Personal history of COVID-19: Secondary | ICD-10-CM | POA: Diagnosis not present

## 2019-12-21 NOTE — Telephone Encounter (Signed)
Stacy [name], have you seen this form? Please advise, thanks!

## 2019-12-21 NOTE — Telephone Encounter (Signed)
DMA form was received on Patient.  Form is in Dr Matilde Bash pod A box to be  signed once he returns to office 01/03/20. Will follow up.

## 2019-12-22 ENCOUNTER — Other Ambulatory Visit: Payer: Self-pay

## 2019-12-22 ENCOUNTER — Ambulatory Visit (INDEPENDENT_AMBULATORY_CARE_PROVIDER_SITE_OTHER): Payer: Medicare HMO | Admitting: Pulmonary Disease

## 2019-12-22 DIAGNOSIS — J8410 Pulmonary fibrosis, unspecified: Secondary | ICD-10-CM

## 2019-12-22 LAB — PULMONARY FUNCTION TEST
DL/VA % pred: 480 %
DL/VA: 19.82 ml/min/mmHg/L
DLCO cor % pred: 88 %
DLCO cor: 18.52 ml/min/mmHg
DLCO unc % pred: 88 %
DLCO unc: 18.52 ml/min/mmHg
FEF 25-75 Pre: 1.61 L/sec
FEF2575-%Pred-Pre: 80 %
FEV1-%Pred-Pre: 54 %
FEV1-Pre: 1.14 L
FEV1FVC-%Pred-Pre: 105 %
FEV6-%Pred-Pre: 53 %
FEV6-Pre: 1.38 L
FEV6FVC-%Pred-Pre: 103 %
FVC-%Pred-Pre: 51 %
FVC-Pre: 1.38 L
Pre FEV1/FVC ratio: 83 %
Pre FEV6/FVC Ratio: 100 %

## 2019-12-22 NOTE — Progress Notes (Signed)
Spirometry/DLCO performed. 

## 2019-12-27 ENCOUNTER — Telehealth: Payer: Self-pay | Admitting: Pulmonary Disease

## 2019-12-27 NOTE — Telephone Encounter (Signed)
I did not see a previous note for PA in the record.  Received a fax from [facility] denying coverage for Zolpidem tart ER 6.25 mg.  It is not on the preferred drug list.  Preferred drug is Belsomra.  Do you want to prescribe this medication?  Please advise.

## 2019-12-28 DIAGNOSIS — J449 Chronic obstructive pulmonary disease, unspecified: Secondary | ICD-10-CM | POA: Diagnosis not present

## 2019-12-28 DIAGNOSIS — I82409 Acute embolism and thrombosis of unspecified deep veins of unspecified lower extremity: Secondary | ICD-10-CM | POA: Diagnosis not present

## 2019-12-28 DIAGNOSIS — Z8616 Personal history of COVID-19: Secondary | ICD-10-CM | POA: Diagnosis not present

## 2019-12-28 DIAGNOSIS — K219 Gastro-esophageal reflux disease without esophagitis: Secondary | ICD-10-CM | POA: Diagnosis not present

## 2019-12-28 DIAGNOSIS — J84112 Idiopathic pulmonary fibrosis: Secondary | ICD-10-CM | POA: Diagnosis not present

## 2019-12-28 DIAGNOSIS — J9611 Chronic respiratory failure with hypoxia: Secondary | ICD-10-CM | POA: Diagnosis not present

## 2019-12-28 DIAGNOSIS — J849 Interstitial pulmonary disease, unspecified: Secondary | ICD-10-CM | POA: Diagnosis not present

## 2019-12-28 DIAGNOSIS — Z9981 Dependence on supplemental oxygen: Secondary | ICD-10-CM | POA: Diagnosis not present

## 2019-12-31 DIAGNOSIS — J45909 Unspecified asthma, uncomplicated: Secondary | ICD-10-CM | POA: Diagnosis not present

## 2019-12-31 DIAGNOSIS — J449 Chronic obstructive pulmonary disease, unspecified: Secondary | ICD-10-CM | POA: Diagnosis not present

## 2020-01-01 ENCOUNTER — Inpatient Hospital Stay (HOSPITAL_COMMUNITY)
Admission: AD | Admit: 2020-01-01 | Discharge: 2020-01-06 | DRG: 196 | Disposition: E | Payer: Medicare HMO | Attending: Internal Medicine | Admitting: Internal Medicine

## 2020-01-01 ENCOUNTER — Other Ambulatory Visit: Payer: Self-pay

## 2020-01-01 ENCOUNTER — Emergency Department (HOSPITAL_COMMUNITY): Payer: Medicare HMO

## 2020-01-01 DIAGNOSIS — Z681 Body mass index (BMI) 19 or less, adult: Secondary | ICD-10-CM | POA: Diagnosis not present

## 2020-01-01 DIAGNOSIS — Z20822 Contact with and (suspected) exposure to covid-19: Secondary | ICD-10-CM | POA: Diagnosis not present

## 2020-01-01 DIAGNOSIS — R627 Adult failure to thrive: Secondary | ICD-10-CM | POA: Diagnosis present

## 2020-01-01 DIAGNOSIS — J9620 Acute and chronic respiratory failure, unspecified whether with hypoxia or hypercapnia: Secondary | ICD-10-CM

## 2020-01-01 DIAGNOSIS — Z515 Encounter for palliative care: Secondary | ICD-10-CM | POA: Diagnosis not present

## 2020-01-01 DIAGNOSIS — T402X5A Adverse effect of other opioids, initial encounter: Secondary | ICD-10-CM | POA: Diagnosis present

## 2020-01-01 DIAGNOSIS — Z7952 Long term (current) use of systemic steroids: Secondary | ICD-10-CM

## 2020-01-01 DIAGNOSIS — E875 Hyperkalemia: Secondary | ICD-10-CM

## 2020-01-01 DIAGNOSIS — J9621 Acute and chronic respiratory failure with hypoxia: Secondary | ICD-10-CM | POA: Diagnosis not present

## 2020-01-01 DIAGNOSIS — E162 Hypoglycemia, unspecified: Secondary | ICD-10-CM | POA: Diagnosis not present

## 2020-01-01 DIAGNOSIS — D638 Anemia in other chronic diseases classified elsewhere: Secondary | ICD-10-CM

## 2020-01-01 DIAGNOSIS — R4020 Unspecified coma: Secondary | ICD-10-CM | POA: Diagnosis not present

## 2020-01-01 DIAGNOSIS — Z86718 Personal history of other venous thrombosis and embolism: Secondary | ICD-10-CM | POA: Diagnosis not present

## 2020-01-01 DIAGNOSIS — E43 Unspecified severe protein-calorie malnutrition: Secondary | ICD-10-CM | POA: Diagnosis present

## 2020-01-01 DIAGNOSIS — F419 Anxiety disorder, unspecified: Secondary | ICD-10-CM | POA: Diagnosis present

## 2020-01-01 DIAGNOSIS — R0602 Shortness of breath: Secondary | ICD-10-CM

## 2020-01-01 DIAGNOSIS — J9601 Acute respiratory failure with hypoxia: Secondary | ICD-10-CM

## 2020-01-01 DIAGNOSIS — R739 Hyperglycemia, unspecified: Secondary | ICD-10-CM | POA: Diagnosis not present

## 2020-01-01 DIAGNOSIS — Z825 Family history of asthma and other chronic lower respiratory diseases: Secondary | ICD-10-CM | POA: Diagnosis not present

## 2020-01-01 DIAGNOSIS — Z8701 Personal history of pneumonia (recurrent): Secondary | ICD-10-CM

## 2020-01-01 DIAGNOSIS — Z9981 Dependence on supplemental oxygen: Secondary | ICD-10-CM

## 2020-01-01 DIAGNOSIS — E876 Hypokalemia: Secondary | ICD-10-CM | POA: Diagnosis not present

## 2020-01-01 DIAGNOSIS — Z8616 Personal history of COVID-19: Secondary | ICD-10-CM | POA: Diagnosis not present

## 2020-01-01 DIAGNOSIS — Z66 Do not resuscitate: Secondary | ICD-10-CM | POA: Diagnosis present

## 2020-01-01 DIAGNOSIS — J8410 Pulmonary fibrosis, unspecified: Secondary | ICD-10-CM | POA: Diagnosis not present

## 2020-01-01 DIAGNOSIS — K219 Gastro-esophageal reflux disease without esophagitis: Secondary | ICD-10-CM | POA: Diagnosis not present

## 2020-01-01 DIAGNOSIS — K5903 Drug induced constipation: Secondary | ICD-10-CM | POA: Diagnosis present

## 2020-01-01 DIAGNOSIS — R630 Anorexia: Secondary | ICD-10-CM | POA: Diagnosis present

## 2020-01-01 DIAGNOSIS — J849 Interstitial pulmonary disease, unspecified: Secondary | ICD-10-CM

## 2020-01-01 DIAGNOSIS — E46 Unspecified protein-calorie malnutrition: Secondary | ICD-10-CM

## 2020-01-01 DIAGNOSIS — R4182 Altered mental status, unspecified: Secondary | ICD-10-CM | POA: Diagnosis not present

## 2020-01-01 DIAGNOSIS — I517 Cardiomegaly: Secondary | ICD-10-CM | POA: Diagnosis not present

## 2020-01-01 DIAGNOSIS — J84112 Idiopathic pulmonary fibrosis: Principal | ICD-10-CM

## 2020-01-01 DIAGNOSIS — R64 Cachexia: Secondary | ICD-10-CM | POA: Diagnosis not present

## 2020-01-01 DIAGNOSIS — J80 Acute respiratory distress syndrome: Secondary | ICD-10-CM | POA: Diagnosis not present

## 2020-01-01 DIAGNOSIS — Z79899 Other long term (current) drug therapy: Secondary | ICD-10-CM

## 2020-01-01 DIAGNOSIS — R7989 Other specified abnormal findings of blood chemistry: Secondary | ICD-10-CM

## 2020-01-01 DIAGNOSIS — Z7901 Long term (current) use of anticoagulants: Secondary | ICD-10-CM

## 2020-01-01 DIAGNOSIS — R0603 Acute respiratory distress: Secondary | ICD-10-CM | POA: Diagnosis present

## 2020-01-01 DIAGNOSIS — Z833 Family history of diabetes mellitus: Secondary | ICD-10-CM | POA: Diagnosis not present

## 2020-01-01 DIAGNOSIS — T68XXXA Hypothermia, initial encounter: Secondary | ICD-10-CM | POA: Diagnosis not present

## 2020-01-01 DIAGNOSIS — I4510 Unspecified right bundle-branch block: Secondary | ICD-10-CM | POA: Diagnosis not present

## 2020-01-01 DIAGNOSIS — E785 Hyperlipidemia, unspecified: Secondary | ICD-10-CM | POA: Diagnosis present

## 2020-01-01 DIAGNOSIS — Z87891 Personal history of nicotine dependence: Secondary | ICD-10-CM

## 2020-01-01 DIAGNOSIS — Z7189 Other specified counseling: Secondary | ICD-10-CM | POA: Diagnosis not present

## 2020-01-01 DIAGNOSIS — Z809 Family history of malignant neoplasm, unspecified: Secondary | ICD-10-CM

## 2020-01-01 DIAGNOSIS — M810 Age-related osteoporosis without current pathological fracture: Secondary | ICD-10-CM | POA: Diagnosis present

## 2020-01-01 DIAGNOSIS — R945 Abnormal results of liver function studies: Secondary | ICD-10-CM | POA: Diagnosis present

## 2020-01-01 DIAGNOSIS — R609 Edema, unspecified: Secondary | ICD-10-CM | POA: Diagnosis not present

## 2020-01-01 DIAGNOSIS — Z79891 Long term (current) use of opiate analgesic: Secondary | ICD-10-CM

## 2020-01-01 DIAGNOSIS — J90 Pleural effusion, not elsewhere classified: Secondary | ICD-10-CM | POA: Diagnosis not present

## 2020-01-01 DIAGNOSIS — R0902 Hypoxemia: Secondary | ICD-10-CM | POA: Diagnosis not present

## 2020-01-01 LAB — CBC
HCT: 35.3 % — ABNORMAL LOW (ref 36.0–46.0)
Hemoglobin: 10.5 g/dL — ABNORMAL LOW (ref 12.0–15.0)
MCH: 22.7 pg — ABNORMAL LOW (ref 26.0–34.0)
MCHC: 29.7 g/dL — ABNORMAL LOW (ref 30.0–36.0)
MCV: 76.4 fL — ABNORMAL LOW (ref 80.0–100.0)
Platelets: 517 10*3/uL — ABNORMAL HIGH (ref 150–400)
RBC: 4.62 MIL/uL (ref 3.87–5.11)
RDW: 15.9 % — ABNORMAL HIGH (ref 11.5–15.5)
WBC: 20.2 10*3/uL — ABNORMAL HIGH (ref 4.0–10.5)
nRBC: 0.1 % (ref 0.0–0.2)

## 2020-01-01 LAB — I-STAT CHEM 8, ED
BUN: 10 mg/dL (ref 8–23)
Calcium, Ion: 1.09 mmol/L — ABNORMAL LOW (ref 1.15–1.40)
Chloride: 112 mmol/L — ABNORMAL HIGH (ref 98–111)
Creatinine, Ser: 0.6 mg/dL (ref 0.44–1.00)
Glucose, Bld: 49 mg/dL — ABNORMAL LOW (ref 70–99)
HCT: 40 % (ref 36.0–46.0)
Hemoglobin: 13.6 g/dL (ref 12.0–15.0)
Potassium: 4.1 mmol/L (ref 3.5–5.1)
Sodium: 143 mmol/L (ref 135–145)
TCO2: 18 mmol/L — ABNORMAL LOW (ref 22–32)

## 2020-01-01 LAB — I-STAT ARTERIAL BLOOD GAS, ED
Acid-base deficit: 4 mmol/L — ABNORMAL HIGH (ref 0.0–2.0)
Bicarbonate: 21.9 mmol/L (ref 20.0–28.0)
Calcium, Ion: 1.12 mmol/L — ABNORMAL LOW (ref 1.15–1.40)
HCT: 35 % — ABNORMAL LOW (ref 36.0–46.0)
Hemoglobin: 11.9 g/dL — ABNORMAL LOW (ref 12.0–15.0)
O2 Saturation: 100 %
Patient temperature: 98.1
Potassium: 3.2 mmol/L — ABNORMAL LOW (ref 3.5–5.1)
Sodium: 140 mmol/L (ref 135–145)
TCO2: 23 mmol/L (ref 22–32)
pCO2 arterial: 41.5 mmHg (ref 32.0–48.0)
pH, Arterial: 7.33 — ABNORMAL LOW (ref 7.350–7.450)
pO2, Arterial: 251 mmHg — ABNORMAL HIGH (ref 83.0–108.0)

## 2020-01-01 LAB — CBG MONITORING, ED
Glucose-Capillary: 45 mg/dL — ABNORMAL LOW (ref 70–99)
Glucose-Capillary: 108 mg/dL — ABNORMAL HIGH (ref 70–99)

## 2020-01-01 LAB — SARS CORONAVIRUS 2 BY RT PCR (HOSPITAL ORDER, PERFORMED IN ~~LOC~~ HOSPITAL LAB): SARS Coronavirus 2: NEGATIVE

## 2020-01-01 LAB — BASIC METABOLIC PANEL
Anion gap: 17 — ABNORMAL HIGH (ref 5–15)
BUN: 11 mg/dL (ref 8–23)
CO2: 19 mmol/L — ABNORMAL LOW (ref 22–32)
Calcium: 8 mg/dL — ABNORMAL LOW (ref 8.9–10.3)
Chloride: 105 mmol/L (ref 98–111)
Creatinine, Ser: 0.82 mg/dL (ref 0.44–1.00)
GFR calc Af Amer: >60 mL/min (ref >60)
GFR calc non Af Amer: >60 mL/min (ref >60)
Glucose, Bld: 315 mg/dL — ABNORMAL HIGH (ref 70–99)
Potassium: 3.4 mmol/L — ABNORMAL LOW (ref 3.5–5.1)
Sodium: 141 mmol/L (ref 135–145)

## 2020-01-01 MED ORDER — POTASSIUM CHLORIDE 10 MEQ/100ML IV SOLN
10.0000 meq | INTRAVENOUS | Status: AC
  Administered 2020-01-01 (×2): 10 meq via INTRAVENOUS
  Filled 2020-01-01 (×2): qty 100

## 2020-01-01 MED ORDER — DEXTROSE 50 % IV SOLN
INTRAVENOUS | Status: AC
  Administered 2020-01-01: 50 mL via INTRAVENOUS
  Filled 2020-01-01: qty 50

## 2020-01-01 MED ORDER — REVEFENACIN 175 MCG/3ML IN SOLN
175.0000 ug | Freq: Every day | RESPIRATORY_TRACT | Status: DC
  Administered 2020-01-01 – 2020-01-05 (×4): 175 ug via RESPIRATORY_TRACT
  Filled 2020-01-01 (×7): qty 3

## 2020-01-01 MED ORDER — INSULIN ASPART 100 UNIT/ML ~~LOC~~ SOLN: 0.0000 [IU] | SUBCUTANEOUS | Status: DC

## 2020-01-01 MED ORDER — ARFORMOTEROL TARTRATE 15 MCG/2ML IN NEBU
15.0000 ug | INHALATION_SOLUTION | Freq: Two times a day (BID) | RESPIRATORY_TRACT | Status: DC
  Administered 2020-01-01 – 2020-01-05 (×8): 15 ug via RESPIRATORY_TRACT
  Filled 2020-01-01 (×12): qty 2

## 2020-01-01 MED ORDER — LORAZEPAM 2 MG/ML IJ SOLN
0.5000 mg | Freq: Once | INTRAMUSCULAR | Status: AC
  Administered 2020-01-01: 0.5 mg via INTRAVENOUS
  Filled 2020-01-01: qty 1

## 2020-01-01 MED ORDER — FUROSEMIDE 10 MG/ML IJ SOLN
40.0000 mg | Freq: Once | INTRAMUSCULAR | Status: AC
  Administered 2020-01-01: 40 mg via INTRAVENOUS
  Filled 2020-01-01: qty 4

## 2020-01-01 MED ORDER — MORPHINE SULFATE (PF) 4 MG/ML IV SOLN
4.0000 mg | INTRAVENOUS | Status: DC | PRN
  Administered 2020-01-01 – 2020-01-03 (×5): 4 mg via INTRAVENOUS
  Filled 2020-01-01 (×5): qty 1

## 2020-01-01 MED ORDER — LORAZEPAM 2 MG/ML IJ SOLN: 1.0000 mg | INTRAMUSCULAR | Status: DC | PRN

## 2020-01-01 MED ORDER — METHYLPREDNISOLONE SODIUM SUCC 125 MG IJ SOLR
60.0000 mg | Freq: Three times a day (TID) | INTRAMUSCULAR | Status: DC
  Administered 2020-01-01 – 2020-01-04 (×8): 60 mg via INTRAVENOUS
  Filled 2020-01-01 (×8): qty 2

## 2020-01-01 MED ORDER — SODIUM CHLORIDE 0.9 % IV SOLN
500.0000 mg | INTRAVENOUS | Status: AC
  Administered 2020-01-01 – 2020-01-03 (×3): 500 mg via INTRAVENOUS
  Filled 2020-01-01 (×3): qty 500

## 2020-01-01 MED ORDER — BUDESONIDE 0.5 MG/2ML IN SUSP
0.5000 mg | Freq: Two times a day (BID) | RESPIRATORY_TRACT | Status: DC
  Administered 2020-01-01 – 2020-01-05 (×8): 0.5 mg via RESPIRATORY_TRACT
  Filled 2020-01-01 (×11): qty 2

## 2020-01-01 MED ORDER — MORPHINE SULFATE (PF) 4 MG/ML IV SOLN
4.0000 mg | Freq: Once | INTRAVENOUS | Status: AC
  Administered 2020-01-01: 4 mg via INTRAVENOUS
  Filled 2020-01-01: qty 1

## 2020-01-01 MED ORDER — DEXTROSE 50 % IV SOLN: 1.0000 | Freq: Once | INTRAVENOUS | Status: AC

## 2020-01-01 NOTE — H&P (Signed)
Stacy [name]:[id number] DOB: [birth year] DOA: 12/16/2019     PCP: Marrian Salvage, [name]   Outpatient Specialists:    Pulmonary  Dr. Vaughan Browner     Patient arrived to ER on 12/15/2019 at 1716 Referred by Attending Davonna Belling, MD   Patient coming from: home Lives   With family    Chief Complaint:Shortness of breath HPI: Stacy [name] is a 64 y.o. female with medical history significant of severe pulmonary fibrosis at baseline on 15 L,  on anticoagulation history of Covid in February 2021, GERD    Presented with   worsening shortness of breath for past 3 to 4 days, No associated fevers or chills no nausea no vomiting she has been losing some weight.  Patient try to use breathing treatments at home did not seem to help called 911 on EMS arrival she was satting 70% on her regular oxygen set up.  Patient was given a dose of Solu-Medrol 125 mg magnesium 2 g and started on the BiPAP at this point she was brought into emergency department [place] [facility] fibrosis felt to be secondary to end-stage UIP initially she was on 4 L but after having Covid this year her oxygen requirement went up to 15 she has been very anxious and had a lot of air hunger developing pulmonary cachexia Infectious risk factors:  Reports shortness of breath,      Has    been vaccinated against COVID    Initial COVID TEST  NEGATIVE   Lab Results  Component Value Date   [place] 12/14/2019   [place] NEGATIVE 12/20/2019   [place] NEGATIVE 07/20/2019   [place] Not Detected 01/20/2019    Regarding pertinent Chronic problems:     Hyperlipidemia - on statins Lipid Panel     Component Value Date/Time   TRIG 162 (H) 08/21/2019 1350   Pulmonary fibrosis followed by pulmonology -on Imuran Esbriet Steroids prednisone 5 mg a day   While in ER: Initially found to be hypoglycemic down to 49 PCCM was consulted for evaluation to discuss prognosis with patient which point  she decided to be DO NOT RESUSCITATE DO NOT INTUBATE but remain on BiPAP. She remains somewhat hypotensive down to 98/76 and tachycardic  ER Provider Called:   PCCM  Dr.Smith They Recommend admit to medicine to stepdown trial of [place] Lasix steroids azithromycin and BiPAP air morphine for air hunger Ativan for anxiety palliative care consult   seen  in ER  Hospitalist was called for admission for end-stage pulmonary fibrosis with worsening hypoxia and respiratory failure  The following Work up has been ordered so far:  Orders Placed This Encounter  Procedures  . SARS Coronavirus 2 by RT PCR (hospital order, performed in [facility] hospital lab) Nasopharyngeal Nasopharyngeal Swab  . DG Chest Portable 1 View  . CBC  . Basic metabolic panel  . Blood gas, arterial  . Basic metabolic panel  . Magnesium  . Brain natriuretic peptide  . Vital signs  . Do not attempt resuscitation (DNR)  . Consult to intensivist  ALL PATIENTS BEING ADMITTED/HAVING PROCEDURES NEED COVID-19 SCREENING  . Consult to hospitalist  ALL PATIENTS BEING ADMITTED/HAVING PROCEDURES NEED COVID-19 SCREENING  . Consult to palliative care  . Bipap  . CBG monitoring, ED  . I-stat chem 8, ED (not at [facility] or [facility])  . I-Stat arterial blood gas, ED  . CBG monitoring, ED  . VAS [place] LOWER EXTREMITY VENOUS (DVT)   Following Medications were ordered in  ER: Medications  methylPREDNISolone sodium succinate (SOLU-MEDROL) 125 mg/2 mL injection 60 mg (has no administration in time range)  azithromycin (ZITHROMAX) 500 mg in sodium chloride 0.9 % 250 mL IVPB (has no administration in time range)  morphine 4 MG/ML injection 4 mg (has no administration in time range)  LORazepam (ATIVAN) injection 1 mg (has no administration in time range)  budesonide (PULMICORT) nebulizer solution 0.5 mg (has no administration in time range)  revefenacin (YUPELRI) nebulizer solution 175 mcg (has no administration in time range)  arformoterol (BROVANA)  nebulizer solution 15 mcg (has no administration in time range)  LORazepam (ATIVAN) injection 0.5 mg (0.5 mg Intravenous Given 01/04/2020 1718)  dextrose 50 % solution 50 mL (50 mLs Intravenous Given 12/19/2019 1807)  morphine 4 MG/ML injection 4 mg (4 mg Intravenous Given 12/30/2019 1846)  furosemide (LASIX) injection 40 mg (40 mg Intravenous Given 12/21/2019 1846)        Consult Orders  (From admission, onward)         Start     Ordered   12/09/2019 1834  Consult to hospitalist  ALL PATIENTS BEING ADMITTED/HAVING PROCEDURES NEED COVID-19 SCREENING  Once       Comments: ALL PATIENTS BEING ADMITTED/HAVING PROCEDURES NEED COVID-19 SCREENING  Provider:  (Not yet assigned)  Question Answer Comment  Place call to: [facility]   Reason for Consult Admit      12/20/2019 1833   01/03/2020 1834  Consult to palliative care  Once       Provider:  (Not yet assigned)  Question Answer [place] [facility] Consult   Reason for Consult? end of life care      12/10/2019 1834            Significant initial  Findings: Abnormal Labs Reviewed  CBC - Abnormal; Notable for the following components:      Result Value   WBC 20.2 (*)    Hemoglobin 10.5 (*)    HCT 35.3 (*)    MCV 76.4 (*)    MCH 22.7 (*)    MCHC 29.7 (*)    RDW 15.9 (*)    Platelets 517 (*)    All other components within normal limits  BASIC METABOLIC PANEL - Abnormal; Notable for the following components:   Potassium 3.4 (*)    CO2 19 (*)    Glucose, Bld 315 (*)    Calcium 8.0 (*)    Anion gap 17 (*)    All other components within normal limits  CBG MONITORING, ED - Abnormal; Notable for the following components:   Glucose-Capillary 45 (*)    All other components within normal limits  I-STAT CHEM 8, ED - Abnormal; Notable for the following components:   Chloride 112 (*)    Glucose, Bld 49 (*)    Calcium, Ion 1.09 (*)    TCO2 18 (*)    All other components within normal limits    I-STAT ARTERIAL BLOOD GAS, ED - Abnormal; Notable for the following components:   pH, Arterial 7.330 (*)    pO2, Arterial 251 (*)    Acid-base deficit 4.0 (*)    Potassium 3.2 (*)    Calcium, Ion 1.12 (*)    HCT 35.0 (*)    Hemoglobin 11.9 (*)    All other components within normal limits  CBG MONITORING, ED - Abnormal; Notable for the following components:   Glucose-Capillary 108 (*)    All other components within normal limits  Otherwise labs showing:    Recent Labs  Lab 12/23/2019 1756 01/02/2020 1819 12/09/2019 1821  NA 143 141 140  K 4.1 3.4* 3.2*  CO2  --  19*  --   GLUCOSE 49* 315*  --   BUN 10 11  --   CREATININE 0.60 0.82  --   CALCIUM  --  8.0*  --     Cr  Stable,  from baseline see below Lab Results  Component Value Date   CREATININE 0.82 12/07/2019   CREATININE 0.60 01/04/2020   CREATININE 0.69 09/10/2019    No results for input(s): AST, ALT, ALKPHOS, BILITOT, PROT, ALBUMIN in the last 168 hours. Lab Results  Component Value Date   CALCIUM 8.0 (L) 12/15/2019   PHOS 2.7 08/26/2019      WBC      Component Value Date/Time   WBC 20.2 (H) 12/20/2019 1819   ANC    Component Value Date/Time   NEUTROABS 4.6 09/10/2019 0711   ALC No components found for: LYMPHAB    Plt: Lab Results  Component Value Date   PLT 517 (H) 12/28/2019    Procalcitonin Ordered   COVID-19 Labs    Lab Results  Component Value Date   SARSCOV2NAA NEGATIVE 12/07/2019   SARSCOV2NAA NEGATIVE 12/20/2019   [place] NEGATIVE 07/20/2019   SARSCOV2NAA Not Detected 01/20/2019    ABG    Component Value Date/Time   PHART 7.330 (L) 01/04/2020 1821   PCO2ART 41.5 01/04/2020 1821   PO2ART 251 (H) 12/13/2019 1821   HCO3 21.9 12/25/2019 1821   TCO2 23 12/31/2019 1821   ACIDBASEDEF 4.0 (H) 12/10/2019 1821   O2SAT 100.0 01/04/2020 1821      HG/HCT   stable,       Component Value Date/Time   HGB 11.9 (L) 12/22/2019 1821   HCT 35.0 (L) 12/28/2019 1821    No  results for input(s): LIPASE, AMYLASE in the last 168 hours. No results for input(s): AMMONIA in the last 168 hours.      ECG: Ordered Personally reviewed by me showing: HR : 108 Rhythm:   Sinus tachycardia   RBBB   no evidence of ischemic changes QTC 480   BNP (last 3 results) Recent Labs    09/08/19 0510 09/09/19 0200 09/10/19 0711  BNP 32.7 28.9 39.1     DM  labs:  HbA1C: No results for input(s): HGBA1C in the last 8760 hours.     CBG (last 3)  Recent Labs    12/14/2019 1802 01/04/2020 1836  GLUCAP 45* 108*       UA   not ordered      Ordered     CXR -  NON acute     ED Triage Vitals  Enc Vitals Group     BP 12/30/2019 1800 98/78     Pulse Rate 12/16/2019 1800 63     Resp 12/25/2019 1800 (!) 31     Temp 12/13/2019 1806 98.1 F (36.7 C)     Temp Source 12/15/2019 1806 Axillary     SpO2 12/16/2019 1800 100 %     Weight --      Height --      Head Circumference --      Peak Flow --      Pain Score --      Pain Loc --      Pain Edu? --      Excl. in [place]? --   BZJI(96)@  Latest  Blood pressure 91/72, pulse (!) 118, temperature 98.1 F (36.7 C), temperature source Axillary, resp. rate (!) 27, SpO2 94 %.    Review of Systems:    Pertinent positives include:  Fatigue,  shortness of breath at rest.  dyspnea   Constitutional:  No weight loss, night sweats, Fevers, chills, weight loss  HEENT:  No headaches, Difficulty swallowing,Tooth/dental problems,Sore throat,  No sneezing, itching, ear ache, nasal congestion, post nasal drip,  Cardio-vascular:  No chest pain, Orthopnea, PND, anasarca, dizziness, palpitations.no Bilateral lower extremity swelling  GI:  No heartburn, indigestion, abdominal pain, nausea, vomiting, diarrhea, change in bowel habits, loss of appetite, melena, blood in stool, hematemesis Resp:  noon exertion, No excess mucus, no productive cough, No non-productive cough, No coughing up of blood.No change in color of mucus.No wheezing. Skin:   no rash or lesions. No jaundice GU:  no dysuria, change in color of urine, no urgency or frequency. No straining to urinate.  No flank pain.  Musculoskeletal:  No joint pain or no joint swelling. No decreased range of motion. No back pain.  Psych:  No change in mood or affect. No depression or anxiety. No memory loss.  Neuro: no localizing neurological complaints, no tingling, no weakness, no double vision, no gait abnormality, no slurred speech, no confusion  All systems reviewed and apart from [place] all are negative  Past Medical History:   Past Medical History:  Diagnosis Date  . GERD (gastroesophageal reflux disease)   . Pneumonia   . Pulmonary fibrosis ([place])       Past Surgical History:  Procedure Laterality Date  . ANKLE SURGERY Left    fx, has plates  . COLONOSCOPY    . ESOPHAGOGASTRODUODENOSCOPY    . HEMORRHOID SURGERY    . LUNG BIOPSY    . TUBAL LIGATION      Social History:  Ambulatory  walker        reports that she quit smoking about 26 years ago. Her smoking use included cigarettes. She started smoking about 52 years ago. She smoked 2.00 packs per day. She has never used smokeless tobacco. She reports current alcohol use. She reports that she does not use drugs.   Family History:   Family History  Problem Relation Age of Onset  . Diabetes Mother   . Cancer Father        unsure of primary source, farmer  . Asthma Brother     Allergies: No Known Allergies   Prior to Admission medications   Medication Sig Start Date End Date Taking? Authorizing Provider  acetaminophen (TYLENOL) 500 MG tablet Take 1,000 mg by mouth every 6 (six) hours as needed for moderate pain. Take 2 tablets twice daily    [provider]  albuterol (VENTOLIN HFA) 108 (90 Base) MCG/ACT inhaler Inhale 2 puffs into the lungs every 6 (six) hours as needed for wheezing or shortness of breath. 09/24/19   Martyn Ehrich, NP  azaTHIOprine (IMURAN) 50 MG tablet TAKE 3 TABLETS  EVERY DAY 09/27/19   Marshell Garfinkel, MD  ELIQUIS 5 MG TABS tablet TAKE 1 TABLET TWICE DAILY 11/22/19   Martyn Ehrich, NP  ESBRIET 267 MG TABS TAKE 3 TABLETS (801 MG TOTAL) BY MOUTH 3 TIMES DAILY. 11/29/19   Martyn Ehrich, NP  ibuprofen (ADVIL) 600 MG tablet Take 1 tablet (600 mg total) by mouth every 8 (eight) hours as needed (3 times a day as needed for pain). 12/17/19   Marshell Garfinkel, MD  ketorolac (TORADOL) 10 MG tablet Take 1 tablet (10 mg total) by mouth every 6 (six) hours as needed. Patient not taking: Reported on 11/12/2019 10/22/19   Marshell Garfinkel, MD  LORazepam (ATIVAN) 0.5 MG tablet Take 0.5 mg by mouth every 4 (four) hours as needed. 09/11/19   [provider]  megestrol (MEGACE) 400 MG/10ML suspension Take 10 mLs (400 mg total) by mouth daily. Patient not taking: Reported on 11/12/2019 10/01/19   Marshell Garfinkel, MD  mirtazapine (REMERON) 15 MG tablet Take 15 mg by mouth at bedtime. 1/2 (7.5 mg) - 1 tab qhs to stimulate appetite/help to sleep    Serpe, Aletha Halim, NP  Multiple Vitamins-Minerals (CENTRUM SILVER) tablet Take 1 tablet by mouth daily.     [provider]  omeprazole (PRILOSEC) 20 MG capsule Take 1 capsule (20 mg total) by mouth daily. 09/24/19   Martyn Ehrich, NP  oxyCODONE (OXY IR/ROXICODONE) 5 MG immediate release tablet SMARTSIG:5 Milligram(s) By Mouth Every 4 Hours PRN 09/12/19   [provider]  OXYGEN Inhale into the lungs. 15 liters per minute (two compressors; one at 7 LPM and one at 8LPM    [provider]  predniSONE (DELTASONE) 5 MG tablet Take 1 tablet (5 mg total) by mouth daily with breakfast. 12/20/19   Mannam, Praveen, MD  RESTASIS 0.05 % ophthalmic emulsion  09/17/19   [provider]  simvastatin (ZOCOR) 20 MG tablet TAKE 1 TABLET DAILY AT 6PM (NO FURTHER REFILLS WITHOUT OFFICE VISIT) Patient taking differently: Take 20 mg by mouth daily at 6 PM.  07/08/19   Biagio Borg, MD  zolpidem (AMBIEN CR) 6.25 MG CR  tablet Take 1 tablet (6.25 mg total) by mouth at bedtime as needed for sleep. 11/05/19   Marshell Garfinkel, MD   Physical Exam: Vitals with BMI 12/10/2019 12/09/2019 12/26/2019  Height - - -  Weight - - -  BMI - - -  Systolic - 91 98  Diastolic - 72 76  Pulse 188 28 116     1. General:  in  Acute distress increased work of breathing     Chronically ill -appearing 2. Psychological: Alert and   Oriented 3. Head/ENT:    Dry Mucous Membranes                          Head Non traumatic, neck supple                           Poor Dentition 4. SKIN:  decreased Skin turgor,  Skin clean Dry and intact no rash 5. Heart: Regular rate and rhythm no Murmur, no Rub or gallop 6. Lungs: no wheezes or crackles   7. Abdomen: Soft,  non-tender, Non distended bowel sounds present 8. Lower extremities: no clubbing, cyanosis, no edema 9. Neurologically Grossly intact, moving all 4 extremities equally  10. MSK: Normal range of motion   All other LABS:     Recent Labs  Lab 12/08/2019 1756 12/18/2019 1819 12/18/2019 1821  WBC  --  20.2*  --   HGB 13.6 10.5* 11.9*  HCT 40.0 35.3* 35.0*  MCV  --  76.4*  --   PLT  --  517*  --      Recent Labs  Lab 12/24/2019 1756 12/16/2019 1819 12/30/2019 1821  NA 143 141 140  K 4.1 3.4* 3.2*  CL 112* 105  --   CO2  --  19*  --   GLUCOSE 49* 315*  --   BUN 10 11  --   CREATININE 0.60 0.82  --   CALCIUM  --  8.0*  --      No results for input(s): AST, ALT, ALKPHOS, BILITOT, PROT, ALBUMIN in the last 168 hours.     Cultures:    Component Value Date/Time   SDES  08/21/2019 1353    BLOOD RIGHT ANTECUBITAL Performed at [facility], [place] [address], [place], [place] [address]    SPECREQUEST  08/21/2019 1353    BOTTLES DRAWN AEROBIC AND ANAEROBIC Blood Culture adequate volume Performed at [facility] [address] [place], [place] [address]    CULT  08/21/2019 1353    NO GROWTH 5 DAYS Performed at [facility], [place] [address], [place], [place] [address]    REPTSTATUS 08/26/2019 FINAL 08/21/2019 1353     Radiological Exams on Admission: DG Chest Portable 1 View  Result Date: 12/17/2019 CLINICAL DATA:  Severe shortness of breath hypoxia. Pulmonary fibrosis. EXAM: PORTABLE CHEST 1 VIEW COMPARISON:  09/07/2019 FINDINGS: Cardiomegaly remains stable. Coarse pulmonary opacity is again seen which predominates peripheral and lower lung zones. This is unchanged in appearance and consistent with chronic interstitial fibrosis. No evidence of acute or superimposed pulmonary infiltrate or pleural effusion. No evidence of pneumothorax. IMPRESSION: Stable cardiomegaly and chronic pulmonary interstitial fibrosis. No acute findings. Electronically Signed   By: Marlaine Hind M.D.   On: 12/24/2019 17:40    Chart has been reviewed    Assessment/Plan   64 y.o. female with medical history significant of severe pulmonary fibrosis at baseline on 15 L,  on anticoagulation history of Covid in February 2021, GERD     Admitted for  end-stage pulmonary fibrosis with worsening hypoxia and respiratory failure   Present on Admission: . Acute on chronic respiratory failure with hypoxia (HCC) admit to stepdown on BiPAP appreciate pulmonary consult continue with steroids azithromycin home medications.  Continue anticoagulation.  Patient at this point interested in comfort would benefit from palliative care consult   . Pulmonary fibrosis ([place]) appreciate pulmonary input..  Overall patient with very poor prognosis palliative care consult at this point with concentration on comfort care with morphine as needed for air hunger  . Gastroesophageal reflux disease- chronic stable continue home meds  . Hypokalemia replace check magnesium  . Malnutrition ([place]) -check prealbumin nutritional consult ordered  Hypoglycemia -follow CBG and treat if needed  Other plan as per orders.  DVT prophylaxis:  Eliquis      Code Status:    Code  Status: DNR concentrating on comfort care as per patient   I had personally discussed CODE STATUS with patient     Family Communication:   Family not at  Bedside   Disposition Plan:   likely will need placement for hospice vs inpatient death Overall very poor prognosis  Following barriers for discharge:                                                         Pain controlled with PO medications                               white count improving  able to transition to PO antibiotics                             Will need to be able to tolerate PO                            Will likely need home hospice                           Will need consultants to evaluate patient prior to discharge                       Would benefit from PT/OT eval prior to DC  Ordered                                                         Transition of care consulted                                      Palliative care    consulted                                       Consults called:    PCCM  Admission status:  ED Disposition    ED Disposition Condition Comment   Admit  The patient appears reasonably stabilized for admission considering the current resources, flow, and capabilities available in the ED at this time, and I doubt any other [facility] requiring further screening and/or treatment in the ED prior to admission is  present.          inpatient     I Expect 2 midnight stay secondary to severity of patient's current illness need for inpatient interventions justified by the following:  hemodynamic instability despite optimal treatment ( hypoxia )   and extensive comorbidities including: Pulmonary fibrosis .   [place] Kitchen Chronic anticoagulation  That are currently affecting medical management.   I expect  patient to be hospitalized for 2 midnights requiring inpatient medical care.  Patient is at high risk for adverse outcome (such as loss of life or disability) if not treated.  Indication for inpatient  stay as follows:    Hemodusynamic instability despite maximal medical therapy,    inability to maintain oral hydration    New or worsening hypoxia Need for BiPAP Need for IV antibiotics    Level of care progressive tele indefinitely please discontinue once patient no longer qualifies COVID-19 Labs    Lab Results  Component Value Date   [place] 12/20/2019     Precautions: admitted as   Covid Negative  No active isolations  If Covid PCR is negative  - please DC precautions    PPE: Used by the provider:   N95  eye Goggles,  Gloves      Adilen Pavelko 01/04/2020, 8:39 PM    [facility]     after 2 AM please page floor coverage PA If 7AM-7PM, please contact the day team taking care of the patient using [email or web address]  Patient was evaluated in the context of the global COVID-19 pandemic, which necessitated consideration that the patient might be at risk for infection with the SARS-CoV-2 virus that causes COVID-19. Institutional protocols and algorithms that pertain to the evaluation of patients at risk for COVID-19 are in a state of rapid change based on information released by regulatory bodies including the CDC and federal and state organizations. These policies and algorithms were followed during the patient's care.

## 2020-01-01 NOTE — ED Provider Notes (Signed)
[place] EMERGENCY DEPARTMENT Provider Note   CSN: [id number] Arrival date & time: 01/02/2020  1716     History No chief complaint on file.   Stacy [name] is a 64 y.o. female. 05 caveat due to fear dyspnea. HPI Patient presented with severe respiratory distress  Arrived at home with shortness of breath in the last 2 days. History is chronic severe pulmonary fibrosis on chronic 15 L of oxygen at home. EMS had sats in the 70s on arrival. Patient is a DNR. States she would however want to be intubated. Has been given Solu-Medrol along with breathing treatments by EMS. Had been placed on BiPAP but became unresponsive. Mental status later improved with bagging. Anxious upon arrival.    Past Medical History:  Diagnosis Date  . GERD (gastroesophageal reflux disease)   . Pneumonia   . Pulmonary fibrosis [facility])     Patient Active Problem List   Diagnosis Date Noted  . Goals of care, counseling/discussion   . Encounter for hospice care discussion   . Palliative care by specialist   . Pneumothorax, right 08/31/2019  . Acute respiratory failure with hypoxia ([place]) 08/22/2019  . Pneumonia due to COVID-19 virus 08/21/2019  . Acute on chronic respiratory failure with hypercapnia ([place]) 07/25/2019  . Community acquired pneumonia 07/20/2019  . Cough 12/16/2018  . Pulmonary fibrosis ([place]) 04/06/2018  . ILD (interstitial lung disease) ([place]) 04/06/2018  . Gastroesophageal reflux disease 04/06/2018  . Osteoporosis 04/06/2018    Past Surgical History:  Procedure Laterality Date  . ANKLE SURGERY Left    fx, has plates  . COLONOSCOPY    . ESOPHAGOGASTRODUODENOSCOPY    . HEMORRHOID SURGERY    . LUNG BIOPSY    . TUBAL LIGATION       OB History   No obstetric history on file.     Family History  Problem Relation Age of Onset  . Diabetes Mother   . Cancer Father        unsure of primary source, farmer  . Asthma Brother     Social History   Tobacco Use  .  Smoking status: Former Smoker    Packs/day: 2.00    Types: Cigarettes    Start date: 07/1967    Quit date: 07/1993    Years since quitting: 26.5  . Smokeless tobacco: Never Used  Vaping Use  . Vaping Use: Never used  Substance Use Topics  . Alcohol use: Yes    Comment: rarely  . Drug use: Never    Home Medications Prior to Admission medications   Medication Sig Start Date End Date Taking? Authorizing Provider  acetaminophen (TYLENOL) 500 MG tablet Take 1,000 mg by mouth every 6 (six) hours as needed for moderate pain. Take 2 tablets twice daily    [provider]  albuterol (VENTOLIN HFA) 108 (90 Base) MCG/ACT inhaler Inhale 2 puffs into the lungs every 6 (six) hours as needed for wheezing or shortness of breath. 09/24/19   Martyn Ehrich, NP  azaTHIOprine (IMURAN) 50 MG tablet TAKE 3 TABLETS EVERY DAY 09/27/19   Marshell Garfinkel, MD  ELIQUIS 5 MG TABS tablet TAKE 1 TABLET TWICE DAILY 11/22/19   Martyn Ehrich, NP  ESBRIET 267 MG TABS TAKE 3 TABLETS (801 MG TOTAL) BY MOUTH 3 TIMES DAILY. 11/29/19   Martyn Ehrich, NP  ibuprofen (ADVIL) 600 MG tablet Take 1 tablet (600 mg total) by mouth every 8 (eight) hours as needed (3 times a day as needed  for pain). 12/17/19   Mannam, Hart Robinsons, MD  ketorolac (TORADOL) 10 MG tablet Take 1 tablet (10 mg total) by mouth every 6 (six) hours as needed. Patient not taking: Reported on 11/12/2019 10/22/19   Marshell Garfinkel, MD  LORazepam (ATIVAN) 0.5 MG tablet Take 0.5 mg by mouth every 4 (four) hours as needed. 09/11/19   [provider]  megestrol (MEGACE) 400 MG/10ML suspension Take 10 mLs (400 mg total) by mouth daily. Patient not taking: Reported on 11/12/2019 10/01/19   Marshell Garfinkel, MD  mirtazapine (REMERON) 15 MG tablet Take 15 mg by mouth at bedtime. 1/2 (7.5 mg) - 1 tab qhs to stimulate appetite/help to sleep    Serpe, Aletha Halim, NP  Multiple Vitamins-Minerals (CENTRUM SILVER) tablet Take 1 tablet by mouth daily.     [provider]  omeprazole (PRILOSEC) 20 MG capsule Take 1 capsule (20 mg total) by mouth daily. 09/24/19   Martyn Ehrich, NP  oxyCODONE (OXY IR/ROXICODONE) 5 MG immediate release tablet SMARTSIG:5 Milligram(s) By Mouth Every 4 Hours PRN 09/12/19   [provider]  OXYGEN Inhale into the lungs. 15 liters per minute (two compressors; one at 7 LPM and one at 8LPM    [provider]  predniSONE (DELTASONE) 5 MG tablet Take 1 tablet (5 mg total) by mouth daily with breakfast. 12/20/19   Mannam, Praveen, MD  RESTASIS 0.05 % ophthalmic emulsion  09/17/19   [provider]  simvastatin (ZOCOR) 20 MG tablet TAKE 1 TABLET DAILY AT 6PM (NO FURTHER REFILLS WITHOUT OFFICE VISIT) Patient taking differently: Take 20 mg by mouth daily at 6 PM.  07/08/19   Biagio Borg, MD  zolpidem (AMBIEN CR) 6.25 MG CR tablet Take 1 tablet (6.25 mg total) by mouth at bedtime as needed for sleep. 11/05/19   Marshell Garfinkel, MD    Allergies    Patient has no known allergies.  Review of Systems   Review of Systems  Unable to perform ROS: Severe respiratory distress    Physical Exam Updated Vital Signs BP 91/72   Pulse (!) 28   Temp 98.1 F (36.7 C) (Axillary)   Resp (!) 29   SpO2 94%   Physical Exam Vitals reviewed.  HENT:     Head: Normocephalic.  Eyes:     Pupils: Pupils are equal, round, and reactive to light.  Cardiovascular:     Rate and Rhythm: Tachycardia present.  Pulmonary:     Comments: Decreased breath sounds throughout. Abdominal:     Tenderness: There is no abdominal tenderness.  Musculoskeletal:        General: No tenderness.     Cervical back: Neck supple.  Skin:    General: Skin is warm.     Capillary Refill: Capillary refill takes less than 2 seconds.  Neurological:     Mental Status: She is alert.     Comments: Patient states she is having a lot of trouble breathing. Unable to get good history initially.     ED Results / Procedures / Treatments    Labs (all labs ordered are listed, but only abnormal results are displayed) Labs Reviewed  CBG MONITORING, ED - Abnormal; Notable for the following components:      Result Value   Glucose-Capillary 45 (*)    All other components within normal limits  I-STAT CHEM 8, ED - Abnormal; Notable for the following components:   Chloride 112 (*)    Glucose, Bld 49 (*)    Calcium, Ion  1.09 (*)    TCO2 18 (*)    All other components within normal limits  I-STAT ARTERIAL BLOOD GAS, ED - Abnormal; Notable for the following components:   pH, Arterial 7.330 (*)    pO2, Arterial 251 (*)    Acid-base deficit 4.0 (*)    Potassium 3.2 (*)    Calcium, Ion 1.12 (*)    HCT 35.0 (*)    Hemoglobin 11.9 (*)    All other components within normal limits  CBG MONITORING, ED - Abnormal; Notable for the following components:   Glucose-Capillary 108 (*)    All other components within normal limits  SARS CORONAVIRUS 2 BY RT PCR (HOSPITAL ORDER, [place] [facility])  CBC  BASIC METABOLIC PANEL  BLOOD GAS, ARTERIAL    EKG None  Radiology DG Chest Portable 1 View  Result Date: 12/15/2019 CLINICAL DATA:  Severe shortness of breath hypoxia. Pulmonary fibrosis. EXAM: PORTABLE CHEST 1 VIEW COMPARISON:  09/07/2019 FINDINGS: Cardiomegaly remains stable. Coarse pulmonary opacity is again seen which predominates peripheral and lower lung zones. This is unchanged in appearance and consistent with chronic interstitial fibrosis. No evidence of acute or superimposed pulmonary infiltrate or pleural effusion. No evidence of pneumothorax. IMPRESSION: Stable cardiomegaly and chronic pulmonary interstitial fibrosis. No acute findings. Electronically Signed   By: Marlaine Hind M.D.   On: 12/25/2019 17:40    Procedures Procedures (including critical care time)  Medications Ordered in ED Medications  morphine 4 MG/ML injection 4 mg (has no administration in time range)  furosemide (LASIX) injection 40 mg  (has no administration in time range)  methylPREDNISolone sodium succinate (SOLU-MEDROL) 125 mg/2 mL injection 60 mg (has no administration in time range)  azithromycin (ZITHROMAX) 500 mg in sodium chloride 0.9 % 250 mL IVPB (has no administration in time range)  morphine 4 MG/ML injection 4 mg (has no administration in time range)  LORazepam (ATIVAN) injection 1 mg (has no administration in time range)  LORazepam (ATIVAN) injection 0.5 mg (0.5 mg Intravenous Given 12/09/2019 1718)  dextrose 50 % solution 50 mL (50 mLs Intravenous Given 01/04/2020 1807)    ED Course  I have reviewed the triage vital signs and the nursing notes.  Pertinent labs & imaging results that were available during my care of the patient were reviewed by me and considered in my medical decision making (see chart for details).    MDM Rules/Calculators/A&P                          Patient with severe pulmonary fibrosis. DNR but states she would want intubation although I think she would likely not come off the vent if she was intubated. Feels much better after some Ativan and BiPAP. X-ray stable. Does have hypoglycemia. Supplemented.Seen in the ER by pulmonary who will give their recommendations. Recommends admission to stepdown. They have added some morphine. Will admit.  CRITICAL CARE Performed by: Davonna Belling Total critical care time: 30 minutes Critical care time was exclusive of separately billable procedures and treating other patients. Critical care was necessary to treat or prevent imminent or life-threatening deterioration. Critical care was time spent personally by me on the following activities: development of treatment plan with patient and/or surrogate as well as nursing, discussions with consultants, evaluation of patient's response to treatment, examination of patient, obtaining history from patient or surrogate, ordering and performing treatments and interventions, ordering and review of laboratory  studies, ordering and review of radiographic  studies, pulse oximetry and re-evaluation of patient's condition.  Final Clinical Impression(s) / ED Diagnoses Final diagnoses:  Acute on chronic respiratory failure, unspecified whether with hypoxia or hypercapnia ([place])  Hypoglycemia    Rx / DC Orders ED Discharge Orders    None       Davonna Belling, MD 01/03/2020 1840

## 2020-01-01 NOTE — Consult Note (Addendum)
Patient seen and examined with Nurse Practitioner.  Agree with their assessment and plan as written.  S: Seen for acute on chronic hypoxemia.  Worsening SOB, anorexia and sinus congestion for several days.  Came in saturations in 70s.  Placed on BIPAP, noted to be hypoglycemic and given d50 with improvement.  Looks as she has lost 15 lbs in past 5 months.  O: Blood pressure 91/72, pulse (!) 28, temperature 98.1 F (36.7 C), temperature source Axillary, resp. rate (!) 29, SpO2 94 %.  Anxious woman on BIPAP Lungs with wheezing and crackles at bases LE 1-2+ edema bilaterally  A:  Acute on chronic hypoxemia in patient with end stage UIP and baseline 15L O2 need, c/w AE-ILD  Anxiety and air hunger  Developing pulmonary cachexia  On anticoagulation  Hypoglycemia  Difficult stick  P:  - Discussed intubation with patient, she does not want this intervention which is consistent with her previously stated wishes, I confirmed with daughter on phone. - In light of this, okay for stepdown - Trial of salvage lasix, steroids, azithromycin, BIPAP - Morphine for air hunger, ativan for anxiety - Palliative care would be helpful, appreciate assistance - Guarded prognosis   12/30/2019 Erskine Emery MD

## 2020-01-01 NOTE — Consult Note (Signed)
NAME:  Stacy [name], MRN:  [id number], DOB:  21-Jun-1956, LOS: 0 ADMISSION DATE:  12/26/2019, CONSULTATION DATE:  6/26 REFERRING MD:  Dr. Alvino Chapel, CHIEF COMPLAINT:  SOB   Brief History   64 y/o F with severe pulmonary fibrosis admitted 6/26 with worsening shortness of breath.    History of present illness   64 y/o F with severe pulmonary fibrosis on 15L O2 at baseline, followed by Dr. Vaughan Browner who presented to [facility] on 6/26 with reports of shortness of breath.    The patient reports worsening am shortness of breath over the last 3-4 days.  She notes increased sinus drainage, cough with occasional sputum production.  She denies fevers, chills, nausea, vomiting. The patient reports she has lost weight.  Chart review shows an approximately 14 lb weight loss in the last 5 months. She attempted breathing treatments at home without improvement in symptoms.  EMS was activated and the patients saturations were in the 70's on EMS arrival.  She was treated with solu-medrol 125 mg per EMS, magnesium 2gm and BiPAP.  She is a known DNR.  CXR evaluation was consistent with prior findings of pulmonary fibrosis & cardiomegaly.  No acute process. ABG 7.33 / 41 / 251 / 21.9.  Initial labs - Na 143, K 4.1, glucose 49, BUN 10 / Sr 0.60, Hgb 13.6.    PCCM consulted for evaluation.   Past Medical History  Pulmonary fibrosis - baseline 15L O2 GERD Palliative Care - seen by [facility], DNR COVID - on 4L in Jan 2021, then had COVID, now on [facility]   6/26 Admit   Consults:    Procedures:    Significant Diagnostic Tests:  CXR 6/26 >> no acute process, chronic interstitial fibrosis, cardiomegaly   Micro Data:  COVID 6/26 >>   Antimicrobials:  Azithromycin 6/26 >>   Interim history/subjective:  As above   Objective   Blood pressure 98/76, pulse (!) 116, temperature 98.1 F (36.7 C), temperature source Axillary, resp. rate (!) 26, SpO2 99 %.       No intake or output data  in the 24 hours ending 12/12/2019 1811 There were no vitals filed for this visit.  Examination: General: chronically ill appearing adult female lying in bed, anxious on BiPAP  HEENT: MM pink/dry, BiPAP mask in place  Neuro: AAOx4, speech clear, MAE CV: s1s2 rrr, no m/r/g PULM: tachypnea, accessory muscle use, bibasilar crackles posterior lower GI: soft, bsx4 active  Extremities: warm/dry, 1+ BLE pitting edema  Skin: no rashes or lesions  Resolved Hospital Problem list     Assessment & Plan:   End Stage Pulmonary Fibrosis / ILD with Acute Exacerbation  Acute on Chronic Hypoxic Respiratory Failure  Hx COVID  Anxiety  Weight Loss / At Risk Malnutrition  Hypoglycemia   -admit to progressive care per [facility]  -azithromycin for anti-inflammatory properties / empiric  -solu-medrol  -brovana + pulmicort + yupelri  -lasix  -assess LE doppler  -PRN morphine, ativan for dyspnea, anxiety  -DNR / DNI > no intubation, confirmed with patient and family -hopeful to turn her around with the above, if not she may need full comfort care  Best practice:  Diet: as tolerated  DVT prophylaxis: per primary  GI prophylaxis: per primary  Glucose control: per primamry  Mobility: as tolerated Code Status: DNR  Family Communication: Daughter updated per Dr. Tamala Julian via phone  Disposition: per primary, progressive care  Labs   CBC: Recent Labs  Lab  12/11/2019 1756  HGB 13.6  HCT 12.7    Basic Metabolic Panel: Recent Labs  Lab 12/15/2019 1756  NA 143  K 4.1  CL 112*  GLUCOSE 49*  BUN 10  CREATININE 0.60   GFR: CrCl cannot be calculated (Unknown ideal weight.). No results for input(s): PROCALCITON, WBC, LATICACIDVEN in the last 168 hours.  Liver Function Tests: No results for input(s): AST, ALT, ALKPHOS, BILITOT, PROT, ALBUMIN in the last 168 hours. No results for input(s): LIPASE, AMYLASE in the last 168 hours. No results for input(s): AMMONIA in the last 168 hours.  ABG    Component  Value Date/Time   TCO2 18 (L) 12/23/2019 1756     Coagulation Profile: No results for input(s): INR, PROTIME in the last 168 hours.  Cardiac Enzymes: No results for input(s): CKTOTAL, CKMB, CKMBINDEX, TROPONINI in the last 168 hours.  HbA1C: No results found for: HGBA1C  CBG: Recent Labs  Lab 12/10/2019 1802  GLUCAP 32*    Review of Systems: Positives in [place]   Gen: Denies fever, chills, weight change, fatigue, night sweats HEENT: Denies blurred vision, double vision, hearing loss, tinnitus, sinus congestion, rhinorrhea, sore throat, neck stiffness, dysphagia PULM: Denies shortness of breath, cough, sputum production, hemoptysis, wheezing CV: Denies chest pain, edema, orthopnea, paroxysmal nocturnal dyspnea, palpitations GI: Denies abdominal pain, nausea, vomiting, diarrhea, hematochezia, melena, constipation, change in bowel habits GU: Denies dysuria, hematuria, polyuria, oliguria, urethral discharge Endocrine: Denies hot or cold intolerance, polyuria, polyphagia or appetite change Derm: Denies rash, dry skin, scaling or peeling skin change Heme: Denies easy bruising, bleeding, bleeding gums Neuro: Denies headache, numbness, weakness, slurred speech, loss of memory or consciousness  Past Medical History  She,  has a past medical history of GERD (gastroesophageal reflux disease), Pneumonia, and Pulmonary fibrosis ([place]).   Surgical History    Past Surgical History:  Procedure Laterality Date   ANKLE SURGERY Left    fx, has plates   COLONOSCOPY     ESOPHAGOGASTRODUODENOSCOPY     HEMORRHOID SURGERY     LUNG BIOPSY     TUBAL LIGATION       Social History   reports that she quit smoking about 26 years ago. Her smoking use included cigarettes. She started smoking about 52 years ago. She smoked 2.00 packs per day. She has never used smokeless tobacco. She reports current alcohol use. She reports that she does not use drugs.   Family History   Her family history  includes Asthma in her brother; Cancer in her father; Diabetes in her mother.   Allergies No Known Allergies   Home Medications  Prior to Admission medications   Medication Sig Start Date End Date Taking? Authorizing Provider  acetaminophen (TYLENOL) 500 MG tablet Take 1,000 mg by mouth every 6 (six) hours as needed for moderate pain. Take 2 tablets twice daily    [provider]  albuterol (VENTOLIN HFA) 108 (90 Base) MCG/ACT inhaler Inhale 2 puffs into the lungs every 6 (six) hours as needed for wheezing or shortness of breath. 09/24/19   Martyn Ehrich, NP  azaTHIOprine (IMURAN) 50 MG tablet TAKE 3 TABLETS EVERY DAY 09/27/19   Marshell Garfinkel, MD  ELIQUIS 5 MG TABS tablet TAKE 1 TABLET TWICE DAILY 11/22/19   Martyn Ehrich, NP  ESBRIET 267 MG TABS TAKE 3 TABLETS (801 MG TOTAL) BY MOUTH 3 TIMES DAILY. 11/29/19   Martyn Ehrich, NP  ibuprofen (ADVIL) 600 MG tablet Take 1 tablet (600 mg total) by  mouth every 8 (eight) hours as needed (3 times a day as needed for pain). 12/17/19   Mannam, Hart Robinsons, MD  ketorolac (TORADOL) 10 MG tablet Take 1 tablet (10 mg total) by mouth every 6 (six) hours as needed. Patient not taking: Reported on 11/12/2019 10/22/19   Marshell Garfinkel, MD  LORazepam (ATIVAN) 0.5 MG tablet Take 0.5 mg by mouth every 4 (four) hours as needed. 09/11/19   [provider]  megestrol (MEGACE) 400 MG/10ML suspension Take 10 mLs (400 mg total) by mouth daily. Patient not taking: Reported on 11/12/2019 10/01/19   Marshell Garfinkel, MD  mirtazapine (REMERON) 15 MG tablet Take 15 mg by mouth at bedtime. 1/2 (7.5 mg) - 1 tab qhs to stimulate appetite/help to sleep    Serpe, Aletha Halim, NP  Multiple Vitamins-Minerals (CENTRUM SILVER) tablet Take 1 tablet by mouth daily.     [provider]  omeprazole (PRILOSEC) 20 MG capsule Take 1 capsule (20 mg total) by mouth daily. 09/24/19   Martyn Ehrich, NP  oxyCODONE (OXY IR/ROXICODONE) 5 MG immediate release tablet  SMARTSIG:5 Milligram(s) By Mouth Every 4 Hours PRN 09/12/19   [provider]  OXYGEN Inhale into the lungs. 15 liters per minute (two compressors; one at 7 LPM and one at 8LPM    [provider]  predniSONE (DELTASONE) 5 MG tablet Take 1 tablet (5 mg total) by mouth daily with breakfast. 12/20/19   Mannam, Praveen, MD  RESTASIS 0.05 % ophthalmic emulsion  09/17/19   [provider]  simvastatin (ZOCOR) 20 MG tablet TAKE 1 TABLET DAILY AT 6PM (NO FURTHER REFILLS WITHOUT OFFICE VISIT) Patient taking differently: Take 20 mg by mouth daily at 6 PM.  07/08/19   Biagio Borg, MD  zolpidem (AMBIEN CR) 6.25 MG CR tablet Take 1 tablet (6.25 mg total) by mouth at bedtime as needed for sleep. 11/05/19   Marshell Garfinkel, MD     Critical care time: n/a     Noe Gens, MSN, NP-C [facility] 12/09/2019, 6:11 PM   Please see [email or web address] for pager details.

## 2020-01-01 NOTE — ED Notes (Signed)
Oral care provided

## 2020-01-01 NOTE — ED Triage Notes (Signed)
Patient arrived from home with severe SOB. Patient arrived on CPAP and Fire assisting ventilations through mask. EMS found patient with sats in the 70s on their arrival. Patient has pulmonary fibrosis and currently on 15l all the time at home. Patient arrived with DNR yellow sheet. Patient denies pain, states I just cant breath. Patient received solumedrol 187m, mag 2 grams. MD and RT at bedside. Patient placed on Bi-pap and tolerating well , alert and oriented.

## 2020-01-02 ENCOUNTER — Encounter (HOSPITAL_COMMUNITY): Payer: Medicare HMO

## 2020-01-02 DIAGNOSIS — J9620 Acute and chronic respiratory failure, unspecified whether with hypoxia or hypercapnia: Secondary | ICD-10-CM

## 2020-01-02 LAB — CBC WITH DIFFERENTIAL/PLATELET
Abs Immature Granulocytes: 0.09 10*3/uL — ABNORMAL HIGH (ref 0.00–0.07)
Basophils Absolute: 0 10*3/uL (ref 0.0–0.1)
Basophils Relative: 0 %
Eosinophils Absolute: 0 10*3/uL (ref 0.0–0.5)
Eosinophils Relative: 0 %
HCT: 34.4 % — ABNORMAL LOW (ref 36.0–46.0)
Hemoglobin: 9.8 g/dL — ABNORMAL LOW (ref 12.0–15.0)
Immature Granulocytes: 1 %
Lymphocytes Relative: 6 %
Lymphs Abs: 0.7 10*3/uL (ref 0.7–4.0)
MCH: 22.4 pg — ABNORMAL LOW (ref 26.0–34.0)
MCHC: 28.5 g/dL — ABNORMAL LOW (ref 30.0–36.0)
MCV: 78.5 fL — ABNORMAL LOW (ref 80.0–100.0)
Monocytes Absolute: 0.2 10*3/uL (ref 0.1–1.0)
Monocytes Relative: 2 %
Neutro Abs: 10.7 10*3/uL — ABNORMAL HIGH (ref 1.7–7.7)
Neutrophils Relative %: 91 %
Platelets: 358 10*3/uL (ref 150–400)
RBC: 4.38 MIL/uL (ref 3.87–5.11)
RDW: 15.8 % — ABNORMAL HIGH (ref 11.5–15.5)
WBC: 11.6 10*3/uL — ABNORMAL HIGH (ref 4.0–10.5)
nRBC: 0 % (ref 0.0–0.2)

## 2020-01-02 LAB — PHOSPHORUS
Phosphorus: 4.4 mg/dL (ref 2.5–4.6)
Phosphorus: 4.4 mg/dL (ref 2.5–4.6)

## 2020-01-02 LAB — BRAIN NATRIURETIC PEPTIDE: B Natriuretic Peptide: 1215.4 pg/mL — ABNORMAL HIGH (ref 0.0–100.0)

## 2020-01-02 LAB — MAGNESIUM
Magnesium: 1.8 mg/dL (ref 1.7–2.4)
Magnesium: 1.8 mg/dL (ref 1.7–2.4)

## 2020-01-02 LAB — COMPREHENSIVE METABOLIC PANEL
ALT: 698 U/L — ABNORMAL HIGH (ref 0–44)
AST: 1448 U/L — ABNORMAL HIGH (ref 15–41)
Albumin: 2.7 g/dL — ABNORMAL LOW (ref 3.5–5.0)
Alkaline Phosphatase: 44 U/L (ref 38–126)
Anion gap: 17 — ABNORMAL HIGH (ref 5–15)
BUN: 11 mg/dL (ref 8–23)
CO2: 21 mmol/L — ABNORMAL LOW (ref 22–32)
Calcium: 8.3 mg/dL — ABNORMAL LOW (ref 8.9–10.3)
Chloride: 105 mmol/L (ref 98–111)
Creatinine, Ser: 0.92 mg/dL (ref 0.44–1.00)
GFR calc Af Amer: >60 mL/min (ref >60)
GFR calc non Af Amer: >60 mL/min (ref >60)
Glucose, Bld: 121 mg/dL — ABNORMAL HIGH (ref 70–99)
Potassium: 4.5 mmol/L (ref 3.5–5.1)
Sodium: 143 mmol/L (ref 135–145)
Total Bilirubin: 0.7 mg/dL (ref 0.3–1.2)
Total Protein: 6.1 g/dL — ABNORMAL LOW (ref 6.5–8.1)

## 2020-01-02 LAB — CBG MONITORING, ED
Glucose-Capillary: 124 mg/dL — ABNORMAL HIGH (ref 70–99)
Glucose-Capillary: 119 mg/dL — ABNORMAL HIGH (ref 70–99)
Glucose-Capillary: 107 mg/dL — ABNORMAL HIGH (ref 70–99)
Glucose-Capillary: 120 mg/dL — ABNORMAL HIGH (ref 70–99)

## 2020-01-02 LAB — HEMOGLOBIN A1C
Hgb A1c MFr Bld: 5.9 % — ABNORMAL HIGH (ref 4.8–5.6)
Mean Plasma Glucose: 122.63 mg/dL

## 2020-01-02 LAB — CK: Total CK: 86 U/L (ref 38–234)

## 2020-01-02 LAB — TSH: TSH: 0.184 u[IU]/mL — ABNORMAL LOW (ref 0.350–4.500)

## 2020-01-02 LAB — GLUCOSE, CAPILLARY: Glucose-Capillary: 158 mg/dL — ABNORMAL HIGH (ref 70–99)

## 2020-01-02 LAB — PREALBUMIN: Prealbumin: 28 mg/dL (ref 18–38)

## 2020-01-02 MED ORDER — ACETAMINOPHEN 325 MG PO TABS: 650.0000 mg | ORAL_TABLET | Freq: Four times a day (QID) | ORAL | Status: DC | PRN

## 2020-01-02 MED ORDER — MIRTAZAPINE 15 MG PO TABS
15.0000 mg | ORAL_TABLET | Freq: Every day | ORAL | Status: DC
  Administered 2020-01-03 – 2020-01-04 (×2): 15 mg via ORAL
  Filled 2020-01-02 (×4): qty 1

## 2020-01-02 MED ORDER — ONDANSETRON HCL 4 MG PO TABS: 4.0000 mg | ORAL_TABLET | Freq: Four times a day (QID) | ORAL | Status: DC | PRN

## 2020-01-02 MED ORDER — SODIUM CHLORIDE 0.9% FLUSH
3.0000 mL | Freq: Two times a day (BID) | INTRAVENOUS | Status: DC
  Administered 2020-01-02 – 2020-01-05 (×6): 3 mL via INTRAVENOUS

## 2020-01-02 MED ORDER — SODIUM CHLORIDE 0.9% FLUSH
3.0000 mL | Freq: Two times a day (BID) | INTRAVENOUS | Status: DC
  Administered 2020-01-02 – 2020-01-05 (×5): 3 mL via INTRAVENOUS

## 2020-01-02 MED ORDER — CYCLOSPORINE 0.05 % OP EMUL
1.0000 [drp] | Freq: Two times a day (BID) | OPHTHALMIC | Status: DC
  Administered 2020-01-02 – 2020-01-05 (×7): 1 [drp] via OPHTHALMIC
  Filled 2020-01-02 (×8): qty 1

## 2020-01-02 MED ORDER — DOCUSATE SODIUM 100 MG PO CAPS
100.0000 mg | ORAL_CAPSULE | Freq: Two times a day (BID) | ORAL | Status: DC
  Administered 2020-01-02 – 2020-01-05 (×7): 100 mg via ORAL
  Filled 2020-01-02 (×7): qty 1

## 2020-01-02 MED ORDER — SODIUM CHLORIDE 0.9% FLUSH: 3.0000 mL | INTRAVENOUS | Status: DC | PRN

## 2020-01-02 MED ORDER — FUROSEMIDE 10 MG/ML IJ SOLN
40.0000 mg | Freq: Once | INTRAMUSCULAR | Status: AC
  Administered 2020-01-02: 40 mg via INTRAVENOUS
  Filled 2020-01-02: qty 4

## 2020-01-02 MED ORDER — SODIUM CHLORIDE 0.9 % IV SOLN: 250.0000 mL | INTRAVENOUS | Status: DC | PRN

## 2020-01-02 MED ORDER — HYDROCODONE-ACETAMINOPHEN 5-325 MG PO TABS: 1.0000 | ORAL_TABLET | ORAL | Status: DC | PRN

## 2020-01-02 MED ORDER — FOLIC ACID 1 MG PO TABS
1.0000 mg | ORAL_TABLET | Freq: Every day | ORAL | Status: DC
  Administered 2020-01-02 – 2020-01-05 (×4): 1 mg via ORAL
  Filled 2020-01-02 (×4): qty 1

## 2020-01-02 MED ORDER — ALBUTEROL SULFATE (2.5 MG/3ML) 0.083% IN NEBU: 2.5000 mg | INHALATION_SOLUTION | RESPIRATORY_TRACT | Status: DC | PRN

## 2020-01-02 MED ORDER — ACETAMINOPHEN 650 MG RE SUPP: 650.0000 mg | Freq: Four times a day (QID) | RECTAL | Status: DC | PRN

## 2020-01-02 MED ORDER — ONDANSETRON HCL 4 MG/2ML IJ SOLN: 4.0000 mg | Freq: Four times a day (QID) | INTRAMUSCULAR | Status: DC | PRN

## 2020-01-02 MED ORDER — SIMVASTATIN 20 MG PO TABS
20.0000 mg | ORAL_TABLET | Freq: Every day | ORAL | Status: DC
  Administered 2020-01-02: 20 mg via ORAL
  Filled 2020-01-02: qty 1

## 2020-01-02 MED ORDER — THIAMINE HCL 100 MG/ML IJ SOLN
100.0000 mg | Freq: Every day | INTRAMUSCULAR | Status: DC
  Administered 2020-01-02 – 2020-01-05 (×4): 100 mg via INTRAVENOUS
  Filled 2020-01-02 (×4): qty 2

## 2020-01-02 MED ORDER — APIXABAN 5 MG PO TABS
5.0000 mg | ORAL_TABLET | Freq: Two times a day (BID) | ORAL | Status: DC
  Administered 2020-01-02 – 2020-01-05 (×7): 5 mg via ORAL
  Filled 2020-01-02 (×8): qty 1

## 2020-01-02 MED ORDER — POTASSIUM CHLORIDE CRYS ER 20 MEQ PO TBCR
40.0000 meq | EXTENDED_RELEASE_TABLET | Freq: Once | ORAL | Status: AC
  Administered 2020-01-02: 40 meq via ORAL
  Filled 2020-01-02: qty 2

## 2020-01-02 NOTE — ED Notes (Signed)
Corey Skains, daughter, [phone] would like an update when available

## 2020-01-02 NOTE — Progress Notes (Addendum)
   01/02/20 1759  Assess: MEWS Score  Temp 97.9 F (36.6 C)  BP 99/75  Pulse Rate (!) 110  ECG Heart Rate (!) 110  Resp (!) 33  Level of Consciousness Alert  SpO2 97 %  O2 Device HFNC  O2 Flow Rate (L/min) 25 L/min  FiO2 (%) 100 %  Assess: MEWS Score  MEWS Temp 0  MEWS Systolic 1  MEWS Pulse 1  MEWS RR 2  MEWS LOC 0  MEWS Score 4  MEWS Score Color Red  Assess: if the MEWS score is Yellow or Red  Were vital signs taken at a resting state? Yes  Focused Assessment Documented focused assessment  Early Detection of Sepsis Score *See Row Information* Medium  MEWS guidelines implemented *See Row Information* Yes  Treat  MEWS Interventions Administered prn meds/treatments  Take Vital Signs  Increase Vital Sign Frequency  Red: Q 1hr X 4 then Q 4hr X 4, if remains red, continue Q 4hrs  Escalate  MEWS: Escalate Red: discuss with charge nurse/RN and provider, consider discussing with RRT  Notify: Charge Nurse/RN  Name of Charge Nurse/RN Notified Lillia Abed RN  Date Charge Nurse/RN Notified 01/02/20  Time Charge Nurse/RN Notified 1802  Notify: Provider  Provider Name/Title Dr. Broadus John MD  Date Provider Notified 01/02/20  Time Provider Notified 1805  Notification Type Page  Notification Reason Other (Comment) (to make aware)  Response No new orders  Date of Provider Response 01/02/20  Time of Provider Response 1806    Patient admitted to 5W-38. On HFNC 25L with fiO2 100%. PRN Morphine administered for dyspnea. Dual skin assessment performed. Will continue to monitor.

## 2020-01-02 NOTE — Progress Notes (Signed)
PROGRESS NOTE    Stacy [name]  YNW:[id number] DOB: [birth year] DOA: 12/26/2019 PCP: Marrian Salvage, [name]  Brief Narrative: 64 year old chronically ill female with severe pulmonary fibrosis on 15 L home O2 at baseline, followed by Dr. Vaughan Browner with pulmonary and palliative medicine presented to the ED on 6/26 with worsening dyspnea on exertion, O2 sats were in the seventies on 15 L when EMS arrived, patient reported gradual worsening of symptoms over the last 3 to 4 days, with occasional cough productive of sputum, continues to have ongoing weight loss, significantly limited in ADLs by her dyspnea. -In the ED she had an ABG which noted a pH of 7.33, PCO2 of 41 and PO2 of 250 on nonrebreather mask, x-ray noted chronic findings only, she was started on BiPAP, also treated with IV Lasix, steroids and antibiotics   Assessment & Plan:   Acute on chronic hypoxic respiratory failure Severe/end-stage pulmonary fibrosis -Followed by [facility] and palliative at baseline,  -In my opinion she would be best served by home hospice and comfort focused care -Currently continue IV Solu-Medrol, azithromycin -Also received Lasix x1 yesterday, appears euvolemic at this time -Pulmonary following, palliative medicine consulted for goals of care  Abnormal LFTs -Could be secondary to shock liver, abdominal exam is benign, denies any pain or tenderness, bilirubin and alkaline phosphatase is normal -Avoid hypotension, trend and monitor  Gastroesophageal reflux disease -Continue PPI  History of DVT -Continue apixaban  Severe protein calorie malnutrition -Cachexia secondary to advanced pulmonary disease -Supplements as tolerated - DVT prophylaxis: Apixaban Code Status: DNR Family Communication: No family at bedside, will attempt to contact daughter Disposition Plan:  Status is: Inpatient  Remains inpatient appropriate because:Inpatient level of care appropriate due to severity of  illness   Dispo: The patient is from: Home              Anticipated d/c is to: Home              Anticipated d/c date is: > 3 days              Patient currently is not medically stable to d/c.  Consultants:  Pulm  Procedures:   Antimicrobials:    Subjective: -Feels a little better, still with significant history of dyspnea with any activity including speaking  Objective: Vitals:   01/02/20 0922 01/02/20 0932 01/02/20 0942 01/02/20 0952  BP:  (!) 135/91    Pulse: 97 93 94 91  Resp: (!) 25 (!) 26 (!) 30 (!) 27  Temp:      TempSrc:      SpO2: (!) 87% 99% 95% 99%    Intake/Output Summary (Last 24 hours) at 01/02/2020 1031 Last data filed at 12/14/2019 2324 Gross per 24 hour  Intake 100 ml  Output --  Net 100 ml   There were no vitals filed for this visit.  Examination:  General exam: Chronically ill female appears much older than stated age awake alert oriented to self and place, mild respiratory distress Respiratory system: Bilateral crackles  cardiovascular system: S1 & S2 heard, RRR.   Gastrointestinal system: Abdomen is nondistended, soft and nontender.Normal bowel sounds heard. Central nervous system: Alert and oriented. No focal neurological deficits. Extremities: No edema Skin: No rashes on exposed skin  psychiatry:  Mood & affect appropriate.     Data Reviewed:   CBC: Recent Labs  Lab 12/31/2019 1756 12/10/2019 1819 12/22/2019 1821 01/02/20 0625  WBC  --  20.2*  --  11.6*  NEUTROABS  --   --   --  10.7*  HGB 13.6 10.5* 11.9* 9.8*  HCT 40.0 35.3* 35.0* 34.4*  MCV  --  76.4*  --  78.5*  PLT  --  517*  --  448   Basic Metabolic Panel: Recent Labs  Lab 12/14/2019 1756 12/13/2019 1819 12/23/2019 1821 01/02/20 0625  NA 143 141 140 143  K 4.1 3.4* 3.2* 4.5  CL 112* 105  --  105  CO2  --  19*  --  21*  GLUCOSE 49* 315*  --  121*  BUN 10 11  --  11  CREATININE 0.60 0.82  --  0.92  CALCIUM  --  8.0*  --  8.3*  MG  --   --   --  1.8  1.8  PHOS  --   --    --  4.4  4.4   GFR: CrCl cannot be calculated (Unknown ideal weight.). Liver Function Tests: Recent Labs  Lab 01/02/20 0625  AST 1,448*  ALT 698*  ALKPHOS 44  BILITOT 0.7  PROT 6.1*  ALBUMIN 2.7*   No results for input(s): LIPASE, AMYLASE in the last 168 hours. No results for input(s): AMMONIA in the last 168 hours. Coagulation Profile: No results for input(s): INR, PROTIME in the last 168 hours. Cardiac Enzymes: Recent Labs  Lab 01/02/20 0625  CKTOTAL 86   BNP (last 3 results) Recent Labs    01/28/19 1602  PROBNP 74.0   HbA1C: Recent Labs    01/02/20 0625  HGBA1C 5.9*   CBG: Recent Labs  Lab 12/10/2019 1802 01/04/2020 1836 01/02/20 0211 01/02/20 0404 01/02/20 0842  GLUCAP 45* 108* 124* 119* 107*   Lipid Profile: No results for input(s): CHOL, HDL, LDLCALC, TRIG, CHOLHDL, LDLDIRECT in the last 72 hours. Thyroid Function Tests: Recent Labs    01/02/20 0625  TSH 0.184*   Anemia Panel: No results for input(s): VITAMINB12, FOLATE, FERRITIN, TIBC, IRON, RETICCTPCT in the last 72 hours. Urine analysis:    Component Value Date/Time   COLORURINE YELLOW 07/21/2019 0333   APPEARANCEUR HAZY (A) 07/21/2019 0333   LABSPEC 1.013 07/21/2019 0333   PHURINE 6.0 07/21/2019 0333   GLUCOSEU NEGATIVE 07/21/2019 0333   HGBUR NEGATIVE 07/21/2019 0333   BILIRUBINUR NEGATIVE 07/21/2019 0333   KETONESUR 5 (A) 07/21/2019 0333   PROTEINUR NEGATIVE 07/21/2019 0333   NITRITE NEGATIVE 07/21/2019 0333   LEUKOCYTESUR TRACE (A) 07/21/2019 0333   Sepsis Labs: _0 (procalcitonin:4,lacticidven:4)  ) Recent Results (from the past 240 hour(s))  SARS Coronavirus 2 by RT PCR (hospital order, performed in [facility] hospital lab) Nasopharyngeal Nasopharyngeal Swab     Status: None   Collection Time: 12/16/2019  6:33 PM   Specimen: Nasopharyngeal Swab  Result Value Ref Range Status   SARS Coronavirus 2 NEGATIVE NEGATIVE Final    Comment: (NOTE) SARS-CoV-2 target nucleic  acids are NOT DETECTED.  The SARS-CoV-2 RNA is generally detectable in upper and lower respiratory specimens during the acute phase of infection. The lowest concentration of SARS-CoV-2 viral copies this assay can detect is 250 copies / mL. A negative result does not preclude SARS-CoV-2 infection and should not be used as the sole basis for treatment or other patient management decisions.  A negative result may occur with improper specimen collection / handling, submission of specimen other than nasopharyngeal swab, presence of viral mutation(s) within the areas targeted by this assay, and inadequate number of viral copies (<250 copies / mL). A negative result must be combined with clinical observations, patient history, and epidemiological information.  Fact Sheet for Patients:   StrictlyIdeas.no  Fact Sheet for Healthcare Providers: BankingDealers.co.za  This test is not yet approved or  cleared by the [place] FDA and has been authorized for detection and/or diagnosis of SARS-CoV-2 by FDA under an Emergency Use Authorization (EUA).  This EUA will remain in effect (meaning this test can be used) for the duration of the COVID-19 declaration under Section 564(b)(1) of the Act, 21 U.S.C. section 360bbb-3(b)(1), unless the authorization is terminated or revoked sooner.  Performed at [facility], [place] [address] [place], [place] [address]          Radiology Studies: DG Chest Portable 1 View  Result Date: 12/07/2019 CLINICAL DATA:  Severe shortness of breath hypoxia. Pulmonary fibrosis. EXAM: PORTABLE CHEST 1 VIEW COMPARISON:  09/07/2019 FINDINGS: Cardiomegaly remains stable. Coarse pulmonary opacity is again seen which predominates peripheral and lower lung zones. This is unchanged in appearance and consistent with chronic interstitial fibrosis. No evidence of acute or superimposed pulmonary infiltrate or pleural effusion.  No evidence of pneumothorax. IMPRESSION: Stable cardiomegaly and chronic pulmonary interstitial fibrosis. No acute findings. Electronically Signed   By: Marlaine Hind M.D.   On: 12/20/2019 17:40        Scheduled Meds: . apixaban  5 mg Oral BID  . arformoterol  15 mcg Nebulization BID  . budesonide (PULMICORT) nebulizer solution  0.5 mg Nebulization BID  . cycloSPORINE  1 drop Both Eyes BID  . docusate sodium  100 mg Oral BID  . folic acid  1 mg Oral Daily  . methylPREDNISolone (SOLU-MEDROL) injection  60 mg Intravenous Q8H  . mirtazapine  15 mg Oral QHS  . revefenacin  175 mcg Nebulization Daily  . simvastatin  20 mg Oral q1800  . sodium chloride flush  3 mL Intravenous Q12H  . sodium chloride flush  3 mL Intravenous Q12H  . thiamine injection  100 mg Intravenous Daily   Continuous Infusions: . sodium chloride    . azithromycin Stopped (12/16/2019 2122)     LOS: 1 day    Time spent: 35mn  PDomenic Polite MD [facility]   01/02/2020, 10:31 AM

## 2020-01-02 NOTE — Progress Notes (Signed)
NAME:  Stacy [name], MRN:  [id number], DOB:  [birth year], LOS: 1 ADMISSION DATE:  12/24/2019, CONSULTATION DATE:  6/26 REFERRING MD:  Dr. Alvino Chapel, CHIEF COMPLAINT:  SOB   Brief History   64 y/o F with severe pulmonary fibrosis admitted 6/26 with worsening shortness of breath.  Suspected ILD flare.     Past Medical History  Pulmonary fibrosis - baseline 15L O2 GERD Palliative Care - seen by [facility], DNR COVID - on 4L in Jan 2021, then had COVID, now on [facility]   6/26 Admit   Consults:    Procedures:    Significant Diagnostic Tests:  CXR 6/26 >> no acute process, chronic interstitial fibrosis, cardiomegaly   Micro Data:  COVID 6/26 >>   Antimicrobials:  Azithromycin 6/26 >>   Interim history/subjective:  Pt reports her mouth is "so dry".  Asking for a break from BiPAP, water No acute events overnight  States breathing is improved   Objective   Blood pressure 105/88, pulse 70, temperature 98.1 F (36.7 C), temperature source Axillary, resp. rate 20, SpO2 100 %.    Vent Mode: BIPAP FiO2 (%):  [50 %-60 %] 60 % Set Rate:  [24 bmp] 24 bmp   Intake/Output Summary (Last 24 hours) at 01/02/2020 0753 Last data filed at 12/23/2019 2324 Gross per 24 hour  Intake 100 ml  Output --  Net 100 ml   There were no vitals filed for this visit.  Examination: General: adult female lying in bed in NAD on BiPAP HEENT: MM pink/dry, bipap mask in place, oral care performed while in room, good dentition  Neuro: AAOx4, speech clear, MAE CV: s1s2 rrr, no m/r/g PULM: non-labored on bipap, lungs bilaterally clear anterior, crackles bilateral posterior 1/2 way up GI: soft, bsx4 active  Extremities: warm/dry, trace to 1+ BLE pitting edema  Skin: no rashes or lesions  Resolved Hospital Problem list     Assessment & Plan:   End Stage Pulmonary Fibrosis / ILD with Acute Exacerbation  Acute on Chronic Hypoxic Respiratory Failure  Hx COVID  Anxiety   Weight Loss / At Risk Malnutrition  Hypoglycemia   -continue azithromycin, D2/5 -solumedrol -BiPAP PRN for increased work of breathing / patient comfort  -continue brovana, pulmicort, yupelri -repeat lasix with KCL -PRN morphine, ativan for dyspnea, anxiety  -DNR / DNI > no intubation, confirmed with patient / family  -will need to clarify goals for home > patient wants more help at home if possible.  Reviewed with primary.  -follow up LE doppler -have asked staff to transfer her to a hospital bed given delay in placement  -glucose control per primary   Best practice:  Diet: as tolerated  DVT prophylaxis: per primary  GI prophylaxis: per primary  Glucose control: per primamry  Mobility: as tolerated Code Status: DNR  Family Communication: Patient updated on plan of care Disposition: per primary, progressive care  Labs   CBC: Recent Labs  Lab 12/27/2019 1756 12/24/2019 1819 12/21/2019 1821 01/02/20 0625  WBC  --  20.2*  --  11.6*  NEUTROABS  --   --   --  10.7*  HGB 13.6 10.5* 11.9* 9.8*  HCT 40.0 35.3* 35.0* 34.4*  MCV  --  76.4*  --  78.5*  PLT  --  517*  --  734    Basic Metabolic Panel: Recent Labs  Lab 01/03/2020 1756 12/25/2019 1819 12/24/2019 1821 01/02/20 0625  NA 143 141 140 143  K 4.1 3.4*  3.2* 4.5  CL 112* 105  --  105  CO2  --  19*  --  21*  GLUCOSE 49* 315*  --  121*  BUN 10 11  --  11  CREATININE 0.60 0.82  --  0.92  CALCIUM  --  8.0*  --  8.3*  MG  --   --   --  1.8  1.8  PHOS  --   --   --  4.4  4.4   GFR: CrCl cannot be calculated (Unknown ideal weight.). Recent Labs  Lab 12/23/2019 1819 01/02/20 0625  WBC 20.2* 11.6*    Liver Function Tests: Recent Labs  Lab 01/02/20 0625  AST 1,448*  ALT 698*  ALKPHOS 44  BILITOT 0.7  PROT 6.1*  ALBUMIN 2.7*   No results for input(s): LIPASE, AMYLASE in the last 168 hours. No results for input(s): AMMONIA in the last 168 hours.  ABG    Component Value Date/Time   PHART 7.330 (L) 01/03/2020  1821   PCO2ART 41.5 12/28/2019 1821   PO2ART 251 (H) 12/08/2019 1821   HCO3 21.9 12/18/2019 1821   TCO2 23 12/12/2019 1821   ACIDBASEDEF 4.0 (H) 12/23/2019 1821   O2SAT 100.0 12/11/2019 1821     Coagulation Profile: No results for input(s): INR, PROTIME in the last 168 hours.  Cardiac Enzymes: Recent Labs  Lab 01/02/20 0625  CKTOTAL 86    HbA1C: Hgb A1c MFr Bld  Date/Time Value Ref Range Status  01/02/2020 06:25 AM 5.9 (H) 4.8 - 5.6 % Final    Comment:    (NOTE) Pre diabetes:          5.7%-6.4%  Diabetes:              >6.4%  Glycemic control for   <7.0% adults with diabetes     CBG: Recent Labs  Lab 12/14/2019 1802 12/31/2019 1836 01/02/20 0211 01/02/20 0404  GLUCAP 45* 108* 124* 119*    Critical care time: n/a     Noe Gens, MSN, NP-C [facility] 01/02/2020, 7:53 AM   Please see [email or web address] for pager details.

## 2020-01-03 ENCOUNTER — Inpatient Hospital Stay (HOSPITAL_COMMUNITY): Payer: Medicare HMO

## 2020-01-03 DIAGNOSIS — R609 Edema, unspecified: Secondary | ICD-10-CM

## 2020-01-03 DIAGNOSIS — Z515 Encounter for palliative care: Secondary | ICD-10-CM

## 2020-01-03 DIAGNOSIS — Z7189 Other specified counseling: Secondary | ICD-10-CM

## 2020-01-03 LAB — COMPREHENSIVE METABOLIC PANEL
ALT: 515 U/L — ABNORMAL HIGH (ref 0–44)
AST: 459 U/L — ABNORMAL HIGH (ref 15–41)
Albumin: 2.8 g/dL — ABNORMAL LOW (ref 3.5–5.0)
Alkaline Phosphatase: 46 U/L (ref 38–126)
Anion gap: 10 (ref 5–15)
BUN: 17 mg/dL (ref 8–23)
CO2: 27 mmol/L (ref 22–32)
Calcium: 8.5 mg/dL — ABNORMAL LOW (ref 8.9–10.3)
Chloride: 106 mmol/L (ref 98–111)
Creatinine, Ser: 0.75 mg/dL (ref 0.44–1.00)
GFR calc Af Amer: >60 mL/min (ref >60)
GFR calc non Af Amer: >60 mL/min (ref >60)
Glucose, Bld: 140 mg/dL — ABNORMAL HIGH (ref 70–99)
Potassium: 4.8 mmol/L (ref 3.5–5.1)
Sodium: 143 mmol/L (ref 135–145)
Total Bilirubin: 0.6 mg/dL (ref 0.3–1.2)
Total Protein: 6.2 g/dL — ABNORMAL LOW (ref 6.5–8.1)

## 2020-01-03 LAB — GLUCOSE, CAPILLARY
Glucose-Capillary: 127 mg/dL — ABNORMAL HIGH (ref 70–99)
Glucose-Capillary: 143 mg/dL — ABNORMAL HIGH (ref 70–99)
Glucose-Capillary: 146 mg/dL — ABNORMAL HIGH (ref 70–99)
Glucose-Capillary: 149 mg/dL — ABNORMAL HIGH (ref 70–99)
Glucose-Capillary: 196 mg/dL — ABNORMAL HIGH (ref 70–99)
Glucose-Capillary: 94 mg/dL (ref 70–99)

## 2020-01-03 LAB — MAGNESIUM: Magnesium: 1.9 mg/dL (ref 1.7–2.4)

## 2020-01-03 LAB — MRSA PCR SCREENING: MRSA by PCR: POSITIVE — AB

## 2020-01-03 MED ORDER — INSULIN ASPART 100 UNIT/ML ~~LOC~~ SOLN
0.0000 [IU] | Freq: Three times a day (TID) | SUBCUTANEOUS | Status: DC
  Administered 2020-01-03: 1 [IU] via SUBCUTANEOUS
  Administered 2020-01-03 – 2020-01-04 (×2): 2 [IU] via SUBCUTANEOUS
  Administered 2020-01-04 – 2020-01-05 (×3): 1 [IU] via SUBCUTANEOUS

## 2020-01-03 MED ORDER — ENSURE ENLIVE PO LIQD
237.0000 mL | Freq: Three times a day (TID) | ORAL | Status: DC
  Administered 2020-01-03 (×2): 237 mL via ORAL

## 2020-01-03 MED ORDER — MORPHINE SULFATE (CONCENTRATE) 10 MG/0.5ML PO SOLN
5.0000 mg | Freq: Three times a day (TID) | ORAL | Status: DC
  Administered 2020-01-03 – 2020-01-05 (×4): 10 mg via ORAL
  Filled 2020-01-03 (×4): qty 0.5

## 2020-01-03 MED ORDER — MORPHINE SULFATE (CONCENTRATE) 10 MG/0.5ML PO SOLN
10.0000 mg | Freq: Three times a day (TID) | ORAL | Status: DC
  Administered 2020-01-03: 10 mg via ORAL
  Filled 2020-01-03: qty 0.5

## 2020-01-03 MED ORDER — BISACODYL 10 MG RE SUPP
10.0000 mg | Freq: Once | RECTAL | Status: AC
  Administered 2020-01-03: 10 mg via RECTAL
  Filled 2020-01-03: qty 1

## 2020-01-03 MED ORDER — POLYETHYLENE GLYCOL 3350 17 G PO PACK
17.0000 g | PACK | Freq: Every day | ORAL | Status: DC
  Administered 2020-01-03 – 2020-01-05 (×3): 17 g via ORAL
  Filled 2020-01-03 (×3): qty 1

## 2020-01-03 MED ORDER — MORPHINE SULFATE (PF) 2 MG/ML IV SOLN
2.0000 mg | INTRAVENOUS | Status: DC | PRN
  Administered 2020-01-04 – 2020-01-05 (×3): 4 mg via INTRAVENOUS
  Filled 2020-01-03 (×4): qty 2

## 2020-01-03 MED ORDER — SENNA 8.6 MG PO TABS
1.0000 | ORAL_TABLET | Freq: Every day | ORAL | Status: DC
  Administered 2020-01-03 – 2020-01-04 (×2): 8.6 mg via ORAL
  Filled 2020-01-03 (×2): qty 1

## 2020-01-03 MED ORDER — MUPIROCIN 2 % EX OINT
1.0000 "application " | TOPICAL_OINTMENT | Freq: Two times a day (BID) | CUTANEOUS | Status: DC
  Administered 2020-01-03 – 2020-01-05 (×6): 1 via NASAL
  Filled 2020-01-03 (×2): qty 22

## 2020-01-03 MED ORDER — CHLORHEXIDINE GLUCONATE CLOTH 2 % EX PADS
6.0000 | MEDICATED_PAD | Freq: Every day | CUTANEOUS | Status: DC
  Administered 2020-01-03 – 2020-01-05 (×3): 6 via TOPICAL

## 2020-01-03 NOTE — Progress Notes (Signed)
01/02/20 2200  Assess: MEWS Score  Temp 98 F (36.7 C)  BP 93/81  Pulse Rate (!) 121  ECG Heart Rate (!) 122  Resp (!) 23  SpO2 97 %  Assess: MEWS Score  MEWS Temp 0  MEWS Systolic 1  MEWS Pulse 2  MEWS RR 1  MEWS LOC 0  MEWS Score 4  MEWS Score Color Red  Assess: if the MEWS score is Yellow or Red  Were vital signs taken at a resting state? Yes  Focused Assessment Documented focused assessment  Early Detection of Sepsis Score *See Row Information* Low  MEWS guidelines implemented *See Row Information* No, previously red, continue vital signs every 4 hours

## 2020-01-03 NOTE — Evaluation (Signed)
Physical Therapy Evaluation Patient Details Name: Stacy [name] MRN: [id number] DOB: June 14, 1956 Today's Date: 01/03/2020   History of Present Illness  64 year old chronically ill female with severe pulmonary fibrosis on 15 L home O2 at baseline, followed by Dr. Vaughan Browner with pulmonary and palliative medicine presented to the ED on 6/26 with worsening dyspnea on exertion, O2 sats were in the seventies on 15 L when EMS arrived, patient reported gradual worsening of symptoms over the last 3 to 4 days, with occasional cough productive of sputum, continues to have ongoing weight loss, significantly limited in ADLs by her dyspnea.  Clinical Impression  Patient presents with decreased mobility due to severely limited activity tolerance with HR up to high 120's with in bed manual muscle test.  Also reports fatigued from having back and earlier up to [facility] for BM.  Patient was able to walk from room to room prior to admission and was active with HHPT already.  Depending on progress/pt wishes may be appropriate for in home Hospice services versus resuming HHPT.  Will continue to follow acutely.     Follow Up Recommendations No PT follow up (home with Hospice vs HHPT)    Equipment Recommendations  None recommended by PT    Recommendations for Other Services       Precautions / Restrictions Precautions Precautions: Fall;Other (comment) Precaution Comments: High O2 demand, watch HR and SpO2      Mobility  Bed Mobility               General bed mobility comments: deferred due to fatigued after bath and up to have BM this morning on [facility]  Transfers                    Ambulation/Gait                Stairs            Wheelchair Mobility    Modified Rankin (Stroke Patients Only)       Balance                                             Pertinent Vitals/Pain Pain Assessment: Faces Faces Pain Scale: Hurts little more Pain Location: abdomen and  headache Pain Descriptors / Indicators: Aching Pain Intervention(s): Monitored during session;Repositioned;RN gave pain meds during session    Home Living Family/patient expects to be discharged to:: Private residence Living Arrangements: Children Available Help at Discharge: Family Type of Home: Mobile home Home Access: Stairs to enter Entrance Stairs-Rails: Can reach both Entrance Stairs-Number of Steps: 3 Home Layout: One level Home Equipment: Walker - 2 wheels;Tub bench;Walker - 4 wheels;Wheelchair - manual      Prior Function Level of Independence: Needs assistance;Independent with assistive device(s)   Gait / Transfers Assistance Needed: limited ambulation tolerance, but "pushes" herself, help for steps, reports cannot get a ramp due to daughter gets help for her rent (states she isn't supposed to even be staying there)  ADL's / Homemaking Assistance Needed: daughter helps with ADL's as ran out of insurance coverage for aides  Comments: Usually uses rollator.  Usually on 13L O2 at home     Hand Dominance   Dominant Hand: Right    Extremity/Trunk Assessment   Upper Extremity Assessment Upper Extremity Assessment: Overall WFL for tasks assessed    Lower Extremity Assessment Lower Extremity Assessment:  Overall [facility] for tasks assessed (strength test at least 4- to 4/5, but with increased dyspnea and HR with testing)       Communication   Communication: No difficulties  Cognition Arousal/Alertness: Awake/alert Behavior During Therapy: WFL for tasks assessed/performed;Anxious Overall Cognitive Status: Within Functional Limits for tasks assessed                                        General Comments General comments (skin integrity, edema, etc.): HR up to 127 with in bed MMT and RR up to high 30's on 25L High flow O2    Exercises     Assessment/Plan    PT Assessment Patient needs continued PT services  PT Problem List Decreased  strength;Decreased mobility;Decreased safety awareness;Decreased knowledge of precautions;Decreased activity tolerance;Cardiopulmonary status limiting activity       PT Treatment Interventions DME instruction;Gait training;Therapeutic activities;Therapeutic exercise;Patient/family education;Balance training;Functional mobility training;Stair training    PT Goals (Current goals can be found in the Care Plan section)  Acute Rehab PT Goals Patient Stated Goal: To go home PT Goal Formulation: With patient Time For Goal Achievement: 01/17/20 Potential to Achieve Goals: Fair    Frequency Min 3X/week   Barriers to discharge        Co-evaluation               AM-PAC PT "6 Clicks" Mobility  Outcome Measure Help needed turning from your back to your side while in a flat bed without using bedrails?: A Little (scored on OT mobility) Help needed moving from lying on your back to sitting on the side of a flat bed without using bedrails?: A Little Help needed moving to and from a bed to a chair (including a wheelchair)?: A Little Help needed standing up from a chair using your arms (e.g., wheelchair or bedside chair)?: A Lot Help needed to walk in hospital room?: A Lot Help needed climbing 3-5 steps with a railing? : A Lot 6 Click Score: 15    End of Session Equipment Utilized During Treatment: Oxygen Activity Tolerance: Patient limited by fatigue;Treatment limited secondary to medical complications (Comment) Patient left: in bed;with call bell/phone within reach Nurse Communication: Mobility status PT Visit Diagnosis: Other abnormalities of gait and mobility (R26.89);Muscle weakness (generalized) (M62.81);Difficulty in walking, not elsewhere classified (R26.2)    Time: 6945-0388 PT Time Calculation (min) (ACUTE ONLY): 13 min   Charges:   PT Evaluation $PT Eval Moderate Complexity: 1 Mod          Stacy [name], PT Acute Rehabilitation  Services [facility]:[phone] [phone] 01/03/2020   Stacy [name] 01/03/2020, 3:25 PM

## 2020-01-03 NOTE — Progress Notes (Signed)
Initial Nutrition Assessment  DOCUMENTATION CODES:   Underweight  INTERVENTION:  Provide Ensure Enlive po TID, each supplement provides 350 kcal and 20 grams of protein.  Encourage adequate PO intake.   NUTRITION DIAGNOSIS:   Increased nutrient needs related to chronic illness (severe pulmonary fibrosis) as evidenced by estimated needs.  GOAL:   Patient will meet greater than or equal to 90% of their needs  MONITOR:   PO intake, Supplement acceptance, Skin, Weight trends, Labs, I & O's  REASON FOR ASSESSMENT:   Consult Assessment of nutrition requirement/status  ASSESSMENT:   64 year old chronically ill female with severe pulmonary fibrosis on 15 L home O2 at baseline presents with worsening dyspnea on exertion.   Pt unavailable during multiple attempted times of contact. RD unable to obtain pt nutrition history at this time. RD to order nutritional supplements to aid in caloric and protein needs. Unable to complete Nutrition-Focused physical exam at this time. RD to perform physical exam at next visit.   Labs and medications reviewed.   Diet Order:   Diet Order            Diet 2 gram sodium Room service appropriate? Yes; Fluid consistency: Thin  Diet effective now                 EDUCATION NEEDS:   Not appropriate for education at this time  Skin:  Skin Assessment: Reviewed RN Assessment  Last BM:  6/28  Height:   Ht Readings from Last 1 Encounters:  01/02/20 _0  (1.676 m)    Weight:   Wt Readings from Last 1 Encounters:  01/02/20 51.7 kg    Ideal Body Weight:  59 kg  BMI:  Body mass index is 18.4 kg/m.  Estimated Nutritional Needs:   Kcal:  9242-6834  Protein:  85-95 grams  Fluid:  >/= 1.7 L/day  Corrin Parker, MS, RD, LDN RD pager number/after hours weekend pager number on Amion.

## 2020-01-03 NOTE — Progress Notes (Signed)
Occupational Therapy Evaluation Patient Details Name: Stacy [name] MRN: [id number] DOB: 12-Apr-1956 Today's Date: 01/03/2020    History of Present Illness 64 year old chronically ill female with severe pulmonary fibrosis on 15 L home O2 at baseline, followed by Dr. Vaughan Browner with pulmonary and palliative medicine presented to the ED on 6/26 with worsening dyspnea on exertion, O2 sats were in the seventies on 15 L when EMS arrived, patient reported gradual worsening of symptoms over the last 3 to 4 days, with occasional cough productive of sputum, continues to have ongoing weight loss, significantly limited in ADLs by her dyspnea.   Clinical Impression   Patient lives at home with daughter.  She requires assist at baseline due to high O2 demand.  She is typically on 13L O2 at home and uses a rollator for mobility, though states she does not get up often because she is very fearful of standing.  Today patient remains anxious.  She is on 25L O2 HHFNC 90% and SpO2 fluctuated between 92-100 throughout session.  With mobility HR increased significantly, with highest at 145 (RN aware).  She consistently practices pursed lip breathing though appears quite labored.  She was able to stand pivot with min assist today to transfer to [facility].  Needed max assist with toileting and required min assist with seated UB ADLs.  Will continue to follow with OT acutely to address the deficits listed below.      Follow Up Recommendations  No OT follow up;Other (comment) (Home (w/hospice? otherwise HHOT))    Equipment Recommendations  3 in 1 bedside commode    Recommendations for Other Services       Precautions / Restrictions Precautions Precautions: Fall;Other (comment) Precaution Comments: High O2 demand, watch HR and SpO2 Restrictions Weight Bearing Restrictions: No      Mobility Bed Mobility Overal bed mobility: Needs Assistance Bed Mobility: Sidelying to Sit;Sit to Sidelying   Sidelying to sit: Min  assist     Sit to sidelying: Min assist General bed mobility comments: With one hand pulling trunk up   Transfers Overall transfer level: Needs assistance Equipment used: 2 person hand held assist Transfers: Sit to/from [facility] Sit to Stand: Min assist Stand pivot transfers: Min assist            Balance Overall balance assessment: Needs assistance Sitting-balance support: Single extremity supported;Feet supported Sitting balance-Leahy Scale: Fair     Standing balance support: Single extremity supported Standing balance-Leahy Scale: Fair Standing balance comment: Static standing patient does not need support (but prefers it due to anxiety), benefits from some support for dynamic                            ADL either performed or assessed with clinical judgement   ADL Overall ADL's : Needs assistance/impaired Eating/Feeding: Sitting;Independent   Grooming: Set up;Sitting   Upper Body Bathing: Minimal assistance;Sitting   Lower Body Bathing: Maximal assistance;Sit to/from stand   Upper Body Dressing : Sitting;Minimal assistance   Lower Body Dressing: Maximal assistance;Sit to/from stand   Toilet Transfer: Minimal assistance;BSC;Stand-pivot   Toileting- Clothing Manipulation and Hygiene: Maximal assistance;Sitting/lateral lean       Functional mobility during ADLs: Minimal assistance General ADL Comments: Did not walk today as aptient is on HHFNC and mobility restricted.  Patient also very anxious     Vision         Perception     Praxis      Pertinent  Vitals/Pain Pain Assessment: Faces Faces Pain Scale: Hurts little more Pain Location: chest with breathing Pain Descriptors / Indicators: Heaviness Pain Intervention(s): Limited activity within patient's tolerance;Monitored during session;Repositioned     Hand Dominance Right   Extremity/Trunk Assessment Upper Extremity Assessment Upper Extremity Assessment:  Generalized weakness   Lower Extremity Assessment Lower Extremity Assessment: Defer to PT evaluation       Communication Communication Communication: No difficulties   Cognition Arousal/Alertness: Awake/alert Behavior During Therapy: WFL for tasks assessed/performed;Anxious Overall Cognitive Status: Within Functional Limits for tasks assessed                                 General Comments: Patient is very anxious about moving and especially standing. She did express her desire to "give up" and "not be a burden anymore" and tat she had talked with palliative this morning about it.    General Comments  BP 100/76 laying, 99/79 on initial sit, 101/86 after 5 min sit.  Patient's HR increased with transfers to 145.  After seated restign for 5-6 min HR decreased to 110's.  RN aware    Exercises     Shoulder Instructions      Home Living Family/patient expects to be discharged to:: Private residence Living Arrangements: Children Available Help at Discharge: Family Type of Home: Other(Comment) (double wide trailer) Home Access: Stairs to enter Technical brewer of Steps: 3 Entrance Stairs-Rails: Can reach both Home Layout: One level     Bathroom Shower/Tub: Tub/shower unit         Home Equipment: Environmental consultant - 2 wheels;Tub bench;Walker - 4 wheels;Wheelchair - manual          Prior Functioning/Environment Level of Independence: Needs assistance;Independent with assistive device(s)        Comments: Usually uses rollator.  Usually on 13L O2 at home        OT Problem List: Decreased strength;Decreased activity tolerance;Impaired balance (sitting and/or standing);Cardiopulmonary status limiting activity      OT Treatment/Interventions: Self-care/ADL training;Therapeutic exercise;Energy conservation;DME and/or AE instruction;Therapeutic activities;Patient/family education;Balance training    OT Goals(Current goals can be found in the care plan section)  Acute Rehab OT Goals Patient Stated Goal: To go home OT Goal Formulation: With patient Time For Goal Achievement: 01/17/20 Potential to Achieve Goals: Fair  OT Frequency: Min 2X/week   Barriers to D/C:            Co-evaluation              AM-PAC OT "6 Clicks" Daily Activity     Outcome Measure Help from another person eating meals?: None Help from another person taking care of personal grooming?: A Little Help from another person toileting, which includes using toliet, bedpan, or urinal?: A Lot Help from another person bathing (including washing, rinsing, drying)?: A Lot Help from another person to put on and taking off regular upper body clothing?: A Little Help from another person to put on and taking off regular lower body clothing?: A Lot 6 Click Score: 16   End of Session Equipment Utilized During Treatment: Oxygen Nurse Communication: Mobility status  Activity Tolerance: Patient tolerated treatment well Patient left: in bed;with call bell/phone within reach;with bed alarm set  OT Visit Diagnosis: Unsteadiness on feet (R26.81);Muscle weakness (generalized) (M62.81);Other (comment) (cardiopulminary limitations)                Time: 3016-0109 OT Time Calculation (min): 59 min Charges:  OT General Charges $OT Visit: 1 Visit OT Evaluation $OT Eval Moderate Complexity: 1 Mod OT Treatments $Self Care/Home Management : 23-37 mins $Therapeutic Activity: 8-22 mins  August Luz, OTR/L  Phylliss Bob 01/03/2020, 12:31 PM

## 2020-01-03 NOTE — Consult Note (Signed)
Consultation Note Date: 01/03/2020   Patient Name: Stacy [name]  DOB: [birth year]  MRN: [id number]  Age / Sex: 64 y.o., female  PCP: Marrian Salvage, [place] Referring Physician: Domenic Polite, MD  Reason for Consultation: Terminal Care  HPI/Patient Profile: 64 y.o. female  with past medical history of pulmonary fibrosis 13-15L at home, pneumonia, GERD, COVID pneumonia Feb 2021 admitted on 12/23/2019 with worsening shortness of breath with pulmonary fibrosis flare. Overall prognosis poor with end stage pulmonary fibrosis and high oxygen requirements.   Clinical Assessment and Goals of Care: Stacy [name] is known to me from previous admission. I consulted with her in Feb/March 2021 when she was admitted with COVID pneumonia. At this time she had elected to go home with hospice so that she could spend time with her family. Per records it appears she opted to d/c hospice as they would not continue all of her medications for her pulmonary fibrosis and she opted to d/c hospice and was set up with outpatient palliative care.   I met today with Stacy [name]. She is lying in bed on HFNC and greets me with a smile. I reminded her of our previous meetings and ask her how things have been going the past few months and she replies that they have been bad. She was hopeful that she would improve after having COVID but never had any further improvement. She admits that this was unlikely to have improvement with her underlying pulmonary fibrosis and she was struggling on 15L at home.   She shares with me today that she feels she is nearing a place where she wants to focus on comfort. She talks of going back home but then also shares concern of feeling like a burden. We discussed palliative care vs hospice care outside the hospital. We discussed that at this time she is requiring 25L oxygen and this cannot be provided outside the  hospital. We discussed better symptom management with scheduled morphine to assist with shortness of breath and with scheduled senokot with history of constipation with opioids. Will make medication adjustments and will reassess tomorrow for options. I offered to call her daughter but she would like for me to call her tomorrow.   All questions/concerns addressed. Emotional support provided.   Primary Decision Maker PATIENT    SUMMARY OF RECOMMENDATIONS   - DNR - Likely transition to comfort care - Will need further discussion on goals based on progression (options limited with 25L oxygen required)  Code Status/Advance Care Planning:  DNR   Symptom Management:   Shortness of breath: Roxanol 5-10 mg every 8 hours scheduled. Continue morphine 2-4 mg IV every 3 hours as needed.   Palliative Prophylaxis:   Bowel Regimen and Turn Reposition  Psycho-social/Spiritual:   Desire for further Chaplaincy support:yes  Additional Recommendations: Education on Hospice and Grief/Bereavement Support  Prognosis:   Overall poor prognosis with end stage pulmonary fibrosis.   Discharge Planning: To Be Determined      Primary Diagnoses: Present on Admission: . Acute respiratory failure  with hypoxia ([place]) . Pulmonary fibrosis ([place]) . Gastroesophageal reflux disease . Hypokalemia . Malnutrition ([place]) . Hyperglycemia . Hypoglycemia   I have reviewed the medical record, interviewed the patient and family, and examined the patient. The following aspects are pertinent.  Past Medical History:  Diagnosis Date  . GERD (gastroesophageal reflux disease)   . Pneumonia   . Pulmonary fibrosis ([facility])    Social History   Socioeconomic History  . Marital status: Divorced    Spouse name: Not on file  . Number of children: 3  . Years of education: Not on file  . Highest education level: Not on file  Occupational History  . Occupation: disabled  Tobacco Use  . Smoking status: Former Smoker      Packs/day: 2.00    Types: Cigarettes    Start date: 07/1967    Quit date: 07/1993    Years since quitting: 26.5  . Smokeless tobacco: Never Used  Vaping Use  . Vaping Use: Never used  Substance and Sexual Activity  . Alcohol use: Yes    Comment: rarely  . Drug use: Never  . Sexual activity: Not Currently  Other Topics Concern  . Not on file  Social History Narrative  . Not on file   Social Determinants of Health   Financial Resource Strain:   . Difficulty of Paying Living Expenses:   Food Insecurity:   . Worried About Charity fundraiser in the Last Year:   . Arboriculturist in the Last Year:   Transportation Needs:   . Film/video editor (Medical):   Marland Kitchen Lack of Transportation (Non-Medical):   Physical Activity:   . Days of Exercise per Week:   . Minutes of Exercise per Session:   Stress:   . Feeling of Stress :   Social Connections:   . Frequency of Communication with Friends and Family:   . Frequency of Social Gatherings with Friends and Family:   . Attends Religious Services:   . Active Member of Clubs or Organizations:   . Attends Archivist Meetings:   Marland Kitchen Marital Status:    Family History  Problem Relation Age of Onset  . Diabetes Mother   . Cancer Father        unsure of primary source, farmer  . Asthma Brother    Scheduled Meds: . apixaban  5 mg Oral BID  . arformoterol  15 mcg Nebulization BID  . budesonide (PULMICORT) nebulizer solution  0.5 mg Nebulization BID  . Chlorhexidine Gluconate Cloth  6 each Topical Q0600  . cycloSPORINE  1 drop Both Eyes BID  . docusate sodium  100 mg Oral BID  . folic acid  1 mg Oral Daily  . insulin aspart  0-9 Units Subcutaneous TID WC  . methylPREDNISolone (SOLU-MEDROL) injection  60 mg Intravenous Q8H  . mirtazapine  15 mg Oral QHS  . mupirocin ointment  1 application Nasal BID  . polyethylene glycol  17 g Oral Daily  . revefenacin  175 mcg Nebulization Daily  . sodium chloride flush  3 mL  Intravenous Q12H  . sodium chloride flush  3 mL Intravenous Q12H  . thiamine injection  100 mg Intravenous Daily   Continuous Infusions: . sodium chloride    . azithromycin 500 mg (01/02/20 1837)   PRN Meds:.sodium chloride, albuterol, LORazepam, morphine injection, ondansetron **OR** ondansetron (ZOFRAN) IV, sodium chloride flush No Known Allergies Review of Systems  Constitutional: Positive for activity change and fatigue.  Respiratory:  Positive for shortness of breath.     Physical Exam Vitals and nursing note reviewed.  Constitutional:      General: She is not in acute distress.    Appearance: She is ill-appearing.  Cardiovascular:     Rate and Rhythm: Tachycardia present.  Pulmonary:     Effort: No tachypnea, accessory muscle usage or respiratory distress.     Comments: Work of breathing with slight effort even at rest Abdominal:     General: Abdomen is flat.     Palpations: Abdomen is soft.  Neurological:     Mental Status: She is alert and oriented to person, place, and time.     Vital Signs: BP (!) 109/91   Pulse (!) 111   Temp 98 F (36.7 C)   Resp (!) 21   Ht _0  (1.676 m)   Wt 51.7 kg   SpO2 93%   BMI 18.40 kg/m  Pain Scale: 0-10   Pain Score: 7    SpO2: SpO2: 93 % O2 Device:SpO2: 93 % O2 Flow Rate: .O2 Flow Rate (L/min): 25 L/min  IO: Intake/output summary:   Intake/Output Summary (Last 24 hours) at 01/03/2020 1029 Last data filed at 01/03/2020 1007 Gross per 24 hour  Intake 256 ml  Output 600 ml  Net -344 ml    LBM: Last BM Date: 12/31/2019 Baseline Weight: Weight: 51.7 kg Most recent weight: Weight: 51.7 kg     Palliative Assessment/Data:     Time In: 1215 Time Out: 1315 Time Total: 60 min Greater than 50%  of this time was spent counseling and coordinating care related to the above assessment and plan.  Signed by: Vinie Sill, NP Palliative Medicine Team Pager # [phone] (M-F 8a-5p) Team Phone # [phone]  (Nights/Weekends)

## 2020-01-03 NOTE — Progress Notes (Addendum)
PROGRESS NOTE    Stacy [name]  ZOX:[id number] DOB: [birth year] DOA: 12/08/2019 PCP: Marrian Salvage, [name]  Brief Narrative: 64 year old chronically ill female with severe pulmonary fibrosis on 15 L home O2 at baseline, followed by Dr. Vaughan Browner with pulmonary and palliative medicine presented to the ED on 6/26 with worsening dyspnea on exertion, O2 sats were in the seventies on 15 L when EMS arrived, patient reported gradual worsening of symptoms over the last 3 to 4 days, with occasional cough productive of sputum, continues to have ongoing weight loss, significantly limited in ADLs by her dyspnea. -In the ED she had an ABG which noted a pH of 7.33, PCO2 of 41 and PO2 of 250 on nonrebreather mask, x-ray noted chronic findings only, she was started on BiPAP, also treated with IV Lasix, steroids and antibiotics   Assessment & Plan:   Acute on chronic hypoxic respiratory failure Severe/end-stage pulmonary fibrosis -Followed by [facility] and palliative at baseline,  -In my opinion she would be best served by hospice and comfort focused care -Currently on 25 L high flow O2 via nasal cannula -Currently continue IV Solu-Medrol, azithromycin -Also received Lasix -on admission, clinically appears euvolemic at this time -Pulmonary following, palliative medicine consulted for goals of care, called and discussed very poor prognosis with patient's daughter Camila Li today  Abnormal LFTs -Could be secondary to shock liver, abdominal exam is benign, denies any pain or tenderness, bilirubin and alkaline phosphatase is normal -Avoid hypotension, trend and monitor -Improving, monitor  Gastroesophageal reflux disease -Continue PPI  History of DVT -Continue apixaban  Severe protein calorie malnutrition -Cachexia secondary to advanced pulmonary disease -Supplements as tolerated - DVT prophylaxis: Apixaban Code Status: DNR Family Communication: No family at bedside, called and  updated daughter Angela Nevin 6/28  disposition Plan:  Status is: Inpatient  Remains inpatient appropriate because:Inpatient level of care appropriate due to severity of illness   Dispo: The patient is from: Home              Anticipated d/c is to: Home              Anticipated d/c date is: > 3 days              Patient currently is not medically stable to d/c.  Consultants:  Pulm  Procedures:   Antimicrobials:    Subjective: -Now on 25 L high flow, continues to be dyspneic eating, speaking even resting in bed  Objective: Vitals:   01/03/20 0600 01/03/20 0805 01/03/20 0823 01/03/20 1003  BP: 117/79   (!) 109/91  Pulse: 92   (!) 111  Resp: (!) 24   (!) 21  Temp: 98.3 F (36.8 C) 97.6 F (36.4 C)  98 F (36.7 C)  TempSrc: Axillary Oral    SpO2: 100%  98% 93%  Weight:      Height:        Intake/Output Summary (Last 24 hours) at 01/03/2020 1033 Last data filed at 01/03/2020 1007 Gross per 24 hour  Intake 256 ml  Output 600 ml  Net -344 ml   Filed Weights   01/02/20 1800  Weight: 51.7 kg    Examination:  General exam: Chronically ill female, appears much older than stated age, awake alert oriented to self and place, mild respiratory distress, extremely cachectic CVS: S1-S2, regular rate rhythm Lungs: Bilateral coarse crackles noted Abdomen: Soft, nontender, bowel sounds present Extremities: No edema Skin: No rashes on exposed skin  psychiatry:  Mood & affect appropriate.  Data Reviewed:   CBC: Recent Labs  Lab 12/14/2019 1756 01/04/2020 1819 12/31/2019 1821 01/02/20 0625  WBC  --  20.2*  --  11.6*  NEUTROABS  --   --   --  10.7*  HGB 13.6 10.5* 11.9* 9.8*  HCT 40.0 35.3* 35.0* 34.4*  MCV  --  76.4*  --  78.5*  PLT  --  517*  --  992   Basic Metabolic Panel: Recent Labs  Lab 12/31/2019 1756 01/04/2020 1819 12/07/2019 1821 01/02/20 0625 01/03/20 0311  NA 143 141 140 143 143  K 4.1 3.4* 3.2* 4.5 4.8  CL 112* 105  --  105 106  CO2  --  19*  --  21* 27    GLUCOSE 49* 315*  --  121* 140*  BUN 10 11  --  11 17  CREATININE 0.60 0.82  --  0.92 0.75  CALCIUM  --  8.0*  --  8.3* 8.5*  MG  --   --   --  1.8  1.8 1.9  PHOS  --   --   --  4.4  4.4  --    GFR: Estimated Creatinine Clearance: 57.2 mL/min (by C-G formula based on SCr of 0.75 mg/dL). Liver Function Tests: Recent Labs  Lab 01/02/20 0625 01/03/20 0311  AST 1,448* 459*  ALT 698* 515*  ALKPHOS 44 46  BILITOT 0.7 0.6  PROT 6.1* 6.2*  ALBUMIN 2.7* 2.8*   No results for input(s): LIPASE, AMYLASE in the last 168 hours. No results for input(s): AMMONIA in the last 168 hours. Coagulation Profile: No results for input(s): INR, PROTIME in the last 168 hours. Cardiac Enzymes: Recent Labs  Lab 01/02/20 0625  CKTOTAL 86   BNP (last 3 results) Recent Labs    01/28/19 1602  PROBNP 74.0   HbA1C: Recent Labs    01/02/20 0625  HGBA1C 5.9*   CBG: Recent Labs  Lab 01/02/20 1239 01/02/20 2004 01/03/20 0025 01/03/20 0442 01/03/20 0740  GLUCAP 120* 158* 127* 149* 146*   Lipid Profile: No results for input(s): CHOL, HDL, LDLCALC, TRIG, CHOLHDL, LDLDIRECT in the last 72 hours. Thyroid Function Tests: Recent Labs    01/02/20 0625  TSH 0.184*   Anemia Panel: No results for input(s): VITAMINB12, FOLATE, FERRITIN, TIBC, IRON, RETICCTPCT in the last 72 hours. Urine analysis:    Component Value Date/Time   COLORURINE YELLOW 07/21/2019 0333   APPEARANCEUR HAZY (A) 07/21/2019 0333   LABSPEC 1.013 07/21/2019 0333   PHURINE 6.0 07/21/2019 0333   GLUCOSEU NEGATIVE 07/21/2019 0333   HGBUR NEGATIVE 07/21/2019 0333   BILIRUBINUR NEGATIVE 07/21/2019 0333   KETONESUR 5 (A) 07/21/2019 0333   PROTEINUR NEGATIVE 07/21/2019 0333   NITRITE NEGATIVE 07/21/2019 0333   LEUKOCYTESUR TRACE (A) 07/21/2019 0333   Sepsis Labs: _0 (procalcitonin:4,lacticidven:4)  ) Recent Results (from the past 240 hour(s))  SARS Coronavirus 2 by RT PCR (hospital order, performed in [place] hospital lab) Nasopharyngeal Nasopharyngeal Swab     Status: None   Collection Time: 12/20/2019  6:33 PM   Specimen: Nasopharyngeal Swab  Result Value Ref Range Status   SARS Coronavirus 2 NEGATIVE NEGATIVE Final    Comment: (NOTE) SARS-CoV-2 target nucleic acids are NOT DETECTED.  The SARS-CoV-2 RNA is generally detectable in upper and lower respiratory specimens during the acute phase of infection. The lowest concentration of SARS-CoV-2 viral copies this assay can detect is 250 copies / mL. A negative result does not preclude SARS-CoV-2 infection and should not  be used as the sole basis for treatment or other patient management decisions.  A negative result may occur with improper specimen collection / handling, submission of specimen other than nasopharyngeal swab, presence of viral mutation(s) within the areas targeted by this assay, and inadequate number of viral copies (<250 copies / mL). A negative result must be combined with clinical observations, patient history, and epidemiological information.  Fact Sheet for Patients:   StrictlyIdeas.no  Fact Sheet for Healthcare Providers: BankingDealers.co.za  This test is not yet approved or  cleared by the [place] FDA and has been authorized for detection and/or diagnosis of SARS-CoV-2 by FDA under an Emergency Use Authorization (EUA).  This EUA will remain in effect (meaning this test can be used) for the duration of the COVID-19 declaration under Section 564(b)(1) of the Act, 21 U.S.C. section 360bbb-3(b)(1), unless the authorization is terminated or revoked sooner.  Performed at [facility], [place] [address], [place], [place] [address]   MRSA PCR Screening     Status: Abnormal   Collection Time: 01/02/20 10:29 PM   Specimen: Nasal Mucosa; Nasopharyngeal  Result Value Ref Range Status   MRSA by PCR POSITIVE (A) NEGATIVE Final    Comment:        The GeneXpert  MRSA Assay (FDA approved for NASAL specimens only), is one component of a comprehensive MRSA colonization surveillance program. It is not intended to diagnose MRSA infection nor to guide or monitor treatment for MRSA infections. RESULT CALLED TO, READ BACK BY AND VERIFIED WITH: LOFTIN,Q RN 0107 01/03/2020 MITCHELL,L Performed at [facility] [address], [place], [place] [address]          Radiology Studies: DG CHEST PORT 1 VIEW  Result Date: 01/03/2020 CLINICAL DATA:  Pulmonary fibrosis.  Shortness of breath EXAM: PORTABLE CHEST 1 VIEW COMPARISON:  January 01, 2020 chest radiograph and chest CT Nov 10, 2018 FINDINGS: Fibrotic change is noted throughout portions of the mid and lower lung regions bilaterally, stable. No new opacity evident. Heart is mildly enlarged with pulmonary vascularity normal. No adenopathy. No bone lesions. IMPRESSION: Stable fibrosis bilaterally. No new opacity evident. Stable cardiac prominence. No adenopathy. Electronically Signed   By: Lowella Grip III M.D.   On: 01/03/2020 07:57   DG Chest Portable 1 View  Result Date: 12/19/2019 CLINICAL DATA:  Severe shortness of breath hypoxia. Pulmonary fibrosis. EXAM: PORTABLE CHEST 1 VIEW COMPARISON:  09/07/2019 FINDINGS: Cardiomegaly remains stable. Coarse pulmonary opacity is again seen which predominates peripheral and lower lung zones. This is unchanged in appearance and consistent with chronic interstitial fibrosis. No evidence of acute or superimposed pulmonary infiltrate or pleural effusion. No evidence of pneumothorax. IMPRESSION: Stable cardiomegaly and chronic pulmonary interstitial fibrosis. No acute findings. Electronically Signed   By: Marlaine Hind M.D.   On: 12/18/2019 17:40        Scheduled Meds: . apixaban  5 mg Oral BID  . arformoterol  15 mcg Nebulization BID  . budesonide (PULMICORT) nebulizer solution  0.5 mg Nebulization BID  . Chlorhexidine Gluconate Cloth  6 each Topical Q0600  .  cycloSPORINE  1 drop Both Eyes BID  . docusate sodium  100 mg Oral BID  . folic acid  1 mg Oral Daily  . insulin aspart  0-9 Units Subcutaneous TID WC  . methylPREDNISolone (SOLU-MEDROL) injection  60 mg Intravenous Q8H  . mirtazapine  15 mg Oral QHS  . mupirocin ointment  1 application Nasal BID  . polyethylene  glycol  17 g Oral Daily  . revefenacin  175 mcg Nebulization Daily  . sodium chloride flush  3 mL Intravenous Q12H  . sodium chloride flush  3 mL Intravenous Q12H  . thiamine injection  100 mg Intravenous Daily   Continuous Infusions: . sodium chloride    . azithromycin 500 mg (01/02/20 1837)     LOS: 2 days   Time spent: 66mn  PDomenic Polite MD [facility]   01/03/2020, 10:33 AM

## 2020-01-03 NOTE — Discharge Instructions (Signed)
Information on my medicine - ELIQUIS (apixaban)  This medication education was reviewed with me or my healthcare representative as part of my discharge preparation.  The pharmacist that spoke with me during my hospital stay was:  Onnie Boer, RPH-CPP  Why was Eliquis prescribed for you? Eliquis was prescribed to treat blood clots that may have been found in the veins of your legs (deep vein thrombosis) or in your lungs (pulmonary embolism) and to reduce the risk of them occurring again.  What do You need to know about Eliquis ? Continue apixaban 5 mg tablet taken TWICE daily.  Eliquis may be taken with or without food.   Try to take the dose about the same time in the morning and in the evening. If you have difficulty swallowing the tablet whole please discuss with your pharmacist how to take the medication safely.  Take Eliquis exactly as prescribed and DO NOT stop taking Eliquis without talking to the doctor who prescribed the medication.  Stopping may increase your risk of developing a new blood clot.  Refill your prescription before you run out.  After discharge, you should have regular check-up appointments with your healthcare provider that is prescribing your Eliquis.    What do you do if you miss a dose? If a dose of ELIQUIS is not taken at the scheduled time, take it as soon as possible on the same day and twice-daily administration should be resumed. The dose should not be doubled to make up for a missed dose.  Important Safety Information A possible side effect of Eliquis is bleeding. You should call your healthcare provider right away if you experience any of the following: ? Bleeding from an injury or your nose that does not stop. ? Unusual colored urine (red or dark brown) or unusual colored stools (red or black). ? Unusual bruising for unknown reasons. ? A serious fall or if you hit your head (even if there is no bleeding).  Some medicines may interact with Eliquis  and might increase your risk of bleeding or clotting while on Eliquis. To help avoid this, consult your healthcare provider or pharmacist prior to using any new prescription or non-prescription medications, including herbals, vitamins, non-steroidal anti-inflammatory drugs (NSAIDs) and supplements.  This website has more information on Eliquis (apixaban): http://www.eliquis.com/eliquis/home

## 2020-01-03 NOTE — Plan of Care (Signed)
  Problem: Education: Goal: Knowledge of General Education information will improve Description: Including pain rating scale, medication(s)/side effects and non-pharmacologic comfort measures Outcome: Progressing

## 2020-01-03 NOTE — Telephone Encounter (Signed)
Form given to Dr Vaughan Browner to sign.  Will document  once form is completed and returned.

## 2020-01-03 NOTE — Progress Notes (Signed)
Lower extremity venous has been completed.   Preliminary results in CV Proc.   Abram Sander 01/03/2020 2:05 PM

## 2020-01-04 DIAGNOSIS — R0602 Shortness of breath: Secondary | ICD-10-CM

## 2020-01-04 DIAGNOSIS — J84112 Idiopathic pulmonary fibrosis: Secondary | ICD-10-CM

## 2020-01-04 LAB — GLUCOSE, CAPILLARY
Glucose-Capillary: 123 mg/dL — ABNORMAL HIGH (ref 70–99)
Glucose-Capillary: 132 mg/dL — ABNORMAL HIGH (ref 70–99)
Glucose-Capillary: 172 mg/dL — ABNORMAL HIGH (ref 70–99)
Glucose-Capillary: 148 mg/dL — ABNORMAL HIGH (ref 70–99)

## 2020-01-04 LAB — COMPREHENSIVE METABOLIC PANEL
ALT: 436 U/L — ABNORMAL HIGH (ref 0–44)
AST: 231 U/L — ABNORMAL HIGH (ref 15–41)
Albumin: 2.9 g/dL — ABNORMAL LOW (ref 3.5–5.0)
Alkaline Phosphatase: 57 U/L (ref 38–126)
Anion gap: 10 (ref 5–15)
BUN: 20 mg/dL (ref 8–23)
CO2: 27 mmol/L (ref 22–32)
Calcium: 8.7 mg/dL — ABNORMAL LOW (ref 8.9–10.3)
Chloride: 104 mmol/L (ref 98–111)
Creatinine, Ser: 0.71 mg/dL (ref 0.44–1.00)
GFR calc Af Amer: >60 mL/min (ref >60)
GFR calc non Af Amer: >60 mL/min (ref >60)
Glucose, Bld: 124 mg/dL — ABNORMAL HIGH (ref 70–99)
Potassium: 5.2 mmol/L — ABNORMAL HIGH (ref 3.5–5.1)
Sodium: 141 mmol/L (ref 135–145)
Total Bilirubin: 0.9 mg/dL (ref 0.3–1.2)
Total Protein: 6.2 g/dL — ABNORMAL LOW (ref 6.5–8.1)

## 2020-01-04 LAB — CBC
HCT: 32.8 % — ABNORMAL LOW (ref 36.0–46.0)
Hemoglobin: 10.1 g/dL — ABNORMAL LOW (ref 12.0–15.0)
MCH: 22.7 pg — ABNORMAL LOW (ref 26.0–34.0)
MCHC: 30.8 g/dL (ref 30.0–36.0)
MCV: 73.7 fL — ABNORMAL LOW (ref 80.0–100.0)
Platelets: 365 10*3/uL (ref 150–400)
RBC: 4.45 MIL/uL (ref 3.87–5.11)
RDW: 15.9 % — ABNORMAL HIGH (ref 11.5–15.5)
WBC: 12.5 10*3/uL — ABNORMAL HIGH (ref 4.0–10.5)
nRBC: 0.6 % — ABNORMAL HIGH (ref 0.0–0.2)

## 2020-01-04 MED ORDER — ENSURE ENLIVE PO LIQD: 237.0000 mL | Freq: Two times a day (BID) | ORAL | Status: DC

## 2020-01-04 MED ORDER — SENNA 8.6 MG PO TABS
2.0000 | ORAL_TABLET | Freq: Every day | ORAL | Status: DC
  Administered 2020-01-05: 17.2 mg via ORAL
  Filled 2020-01-04: qty 2

## 2020-01-04 MED ORDER — BOOST / RESOURCE BREEZE PO LIQD CUSTOM
1.0000 | Freq: Three times a day (TID) | ORAL | Status: DC
  Administered 2020-01-04 – 2020-01-05 (×2): 1 via ORAL

## 2020-01-04 MED ORDER — METHYLPREDNISOLONE SODIUM SUCC 125 MG IJ SOLR
125.0000 mg | Freq: Three times a day (TID) | INTRAMUSCULAR | Status: DC
  Administered 2020-01-04 – 2020-01-05 (×3): 125 mg via INTRAVENOUS
  Filled 2020-01-04 (×3): qty 2

## 2020-01-04 MED ORDER — ALUM & MAG HYDROXIDE-SIMETH 200-200-20 MG/5ML PO SUSP
30.0000 mL | ORAL | Status: DC | PRN
  Administered 2020-01-04: 30 mL via ORAL
  Filled 2020-01-04: qty 30

## 2020-01-04 NOTE — Telephone Encounter (Signed)
I feel it the prescription should come from primary care

## 2020-01-04 NOTE — Progress Notes (Signed)
NAME:  Stacy [name], MRN:  [id number], DOB:  May 22, 1956, LOS: 3 ADMISSION DATE:  12/09/2019, CONSULTATION DATE:  6/26 REFERRING MD:  Dr. Alvino Chapel, CHIEF COMPLAINT:  SOB   Brief History   64 y/o F with severe pulmonary fibrosis admitted 6/26 with worsening shortness of breath.  Suspected ILD flare.     Past Medical History  Pulmonary fibrosis - baseline 15L O2 GERD Palliative Care - seen by [facility], DNR COVID - on 4L in Jan 2021, then had COVID, now on [facility]   6/26 Admit   Consults:    Procedures:    Significant Diagnostic Tests:  CXR 6/26 >> no acute process, chronic interstitial fibrosis, cardiomegaly   Micro Data:  COVID 6/26 >>   Antimicrobials:  Azithromycin 6/26 >>   Interim history/subjective:  Pt reports her mouth is "so dry".  Asking for a break from BiPAP, water No acute events overnight  States breathing is improved  Still very short of breath with no reserves for any activity  Objective   Blood pressure 109/78, pulse 94, temperature 97.8 F (36.6 C), temperature source Oral, resp. rate (!) 25, height 5' 6" (1.676 m), weight 51.7 kg, SpO2 96 %.    FiO2 (%):  [90 %] 90 %   Intake/Output Summary (Last 24 hours) at 01/04/2020 1212 Last data filed at 01/04/2020 4247 Gross per 24 hour  Intake 246 ml  Output 200 ml  Net 46 ml   Filed Weights   01/02/20 1800  Weight: 51.7 kg    Examination: General: On oxygen supplementation HEENT: MM pink/dry, bipap mask in place, oral care performed while in room, good dentition  Neuro: AAOx4, speech clear, MAE CV: s1s2 rrr, no m/r/g PULM: Bilateral crackles  GI: soft, bsx4 active  Extremities: warm/dry, trace to 1+ BLE pitting edema  Skin: no rashes or lesions  Resolved Hospital Problem list     Assessment & Plan:   End Stage Pulmonary Fibrosis / ILD with Acute Exacerbation  Acute on Chronic Hypoxic Respiratory Failure  Hx COVID  Anxiety  Weight Loss / At Risk  Malnutrition  Hypoglycemia   -continue azithromycin -solumedrol-increased to 125 every 8 -BiPAP PRN for increased work of breathing / patient comfort  -continue brovana, pulmicort, yupelri -repeat lasix with KCL -PRN morphine, ativan for dyspnea, anxiety  -DNR / DNI > no intubation, confirmed with patient / family  -Appreciate palliative care involvement   Very high mortality with acute exacerbation of pulmonary fibrosis regardless of intervention, unfortunately.  Sherrilyn Rist, MD [facility] Pager: [phone]

## 2020-01-04 NOTE — Progress Notes (Signed)
Nutrition Follow-up  DOCUMENTATION CODES:   Underweight  INTERVENTION:  Provide Boost Breeze po TID, each supplement provides 250 kcal and 9 grams of protein.  Provide Ensure Enlive po BID, each supplement provides 350 kcal and 20 grams of protein.  Encourage adequate PO intake.   NUTRITION DIAGNOSIS:   Increased nutrient needs related to chronic illness (severe pulmonary fibrosis) as evidenced by estimated needs; ongoing  GOAL:   Patient will meet greater than or equal to 90% of their needs; progressing  MONITOR:   PO intake, Supplement acceptance, Skin, Weight trends, Labs, I & O's  REASON FOR ASSESSMENT:   Consult Assessment of nutrition requirement/status  ASSESSMENT:   64 year old chronically ill female with severe pulmonary fibrosis on 15 L home O2 at baseline presents with worsening dyspnea on exertion.  Pt currently on 25 L/min HFNC. Pt reports difficulty breathing and talking at the same time during time of visit. Pt reports having an appetite, however unable to eat much at meals due to oxygen treatment. Pt reports decreased po over the past 2-3 weeks. Pt unable to quantify food eaten at meals at home. Pt currently has Ensure ordered and reports it causing constipation. Pt agreeable to alternative nutritional supplements. RD to order [facility]. Pt encouraged to drink her nutritional supplements and to eat her food at meals as tolerated.   NUTRITION - FOCUSED PHYSICAL EXAM:    Most Recent Value  Orbital Region Unable to assess  Upper Arm Region No depletion  Thoracic and Lumbar Region No depletion  Buccal Region Unable to assess  Temple Region Unable to assess  Clavicle Bone Region Moderate depletion  Clavicle and Acromion Bone Region Moderate depletion  Scapular Bone Region Unable to assess  Dorsal Hand Unable to assess  Patellar Region Unable to assess  Anterior Thigh Region Unable to assess  Posterior Calf Region Unable to assess  Edema (RD Assessment)  None  Hair Reviewed  Eyes Reviewed  Mouth Reviewed  Skin Reviewed  Nails Reviewed     Labs and medications reviewed.   Diet Order:   Diet Order            Diet 2 gram sodium Room service appropriate? Yes; Fluid consistency: Thin  Diet effective now                 EDUCATION NEEDS:   Not appropriate for education at this time  Skin:  Skin Assessment: Reviewed RN Assessment  Last BM:  6/28  Height:   Ht Readings from Last 1 Encounters:  01/02/20 5' 6" (1.676 m)    Weight:   Wt Readings from Last 1 Encounters:  01/02/20 51.7 kg    Ideal Body Weight:  59 kg  BMI:  Body mass index is 18.4 kg/m.  Estimated Nutritional Needs:   Kcal:  4383-7793  Protein:  85-95 grams  Fluid:  >/= 1.7 L/day    Corrin Parker, MS, RD, LDN RD pager number/after hours weekend pager number on Amion.

## 2020-01-04 NOTE — Progress Notes (Signed)
Physical Therapy Treatment Patient Details Name: Stacy [name] MRN: [id number] DOB: 1955-09-07 Today's Date: 01/04/2020    History of Present Illness 64 year old chronically ill female with severe pulmonary fibrosis on 15 L home O2 at baseline, followed by Dr. Vaughan Browner with pulmonary and palliative medicine presented to the ED on 6/26 with worsening dyspnea on exertion, O2 sats were in the seventies on 15 L when EMS arrived, patient reported gradual worsening of symptoms over the last 3 to 4 days, with occasional cough productive of sputum, continues to have ongoing weight loss, significantly limited in ADLs by her dyspnea.    PT Comments    Patient very dyspneic at rest, however uncomfortable due to purewick leaking with wet linens. Assisted nursing with mobilizing pt to allow change of bed linens, change purewick, and donning hospital briefs to assist with holding purewick in place. Patient required multiple rest breaks due to either incr HR (max 148 bpm) or dropping sats (83% at lowest; on 25L with FiO2 90%).   Patient currently not able to tolerate further activity or bed level exercises. Patient stating she just wants this to all be over.   Will continue efforts to mobilize patient as able.    Follow Up Recommendations  No PT follow up (home with Hospice vs HHPT)     Equipment Recommendations  None recommended by PT    Recommendations for Other Services       Precautions / Restrictions Precautions Precautions: Fall;Other (comment) Precaution Comments: High O2 demand, watch HR and SpO2    Mobility  Bed Mobility Overal bed mobility: Needs Assistance Bed Mobility: Rolling Rolling: Min assist         General bed mobility comments: rolling to remove wet linens and don new mesh briefs  Transfers                    Ambulation/Gait                 Stairs             Wheelchair Mobility    Modified Rankin (Stroke Patients Only)       Balance                                             Cognition Arousal/Alertness: Awake/alert Behavior During Therapy: Anxious Overall Cognitive Status: Within Functional Limits for tasks assessed                                 General Comments: Patient very dyspneic on 25L O2 with FiO2 90%. Sats varied 83-92%       Exercises Other Exercises Other Exercises: demonstrated pursed lip breathing and pt could maintain for ~30 seconds with rate slowing and then reverts to quick short breaths    General Comments General comments (skin integrity, edema, etc.): HR 120-148 bpm with rolling.       Pertinent Vitals/Pain Pain Assessment: Faces Faces Pain Scale: Hurts a little bit Pain Location: perineum from purewick Pain Descriptors / Indicators: Burning;Sore Pain Intervention(s): Other (comment) (RN notified and in to assess)    Home Living                      Prior Function  PT Goals (current goals can now be found in the care plan section) Acute Rehab PT Goals Patient Stated Goal: To go home Time For Goal Achievement: 01/17/20 Potential to Achieve Goals: Fair Progress towards PT goals: Not progressing toward goals - comment    Frequency    Min 3X/week      PT Plan Current plan remains appropriate    Co-evaluation              AM-PAC PT "6 Clicks" Mobility   Outcome Measure  Help needed turning from your back to your side while in a flat bed without using bedrails?: A Little (scored on OT mobility) Help needed moving from lying on your back to sitting on the side of a flat bed without using bedrails?: A Little Help needed moving to and from a bed to a chair (including a wheelchair)?: Total Help needed standing up from a chair using your arms (e.g., wheelchair or bedside chair)?: Total Help needed to walk in hospital room?: Total Help needed climbing 3-5 steps with a railing? : Total 6 Click Score: 10    End of  Session Equipment Utilized During Treatment: Oxygen Activity Tolerance: Treatment limited secondary to medical complications (Comment) Patient left: in bed;with call bell/phone within reach;with bed alarm set Nurse Communication: Mobility status;Other (comment) (reporting pain from purewick and towel) PT Visit Diagnosis: Other abnormalities of gait and mobility (R26.89);Muscle weakness (generalized) (M62.81);Difficulty in walking, not elsewhere classified (R26.2)     Time: 0149-9692 PT Time Calculation (min) (ACUTE ONLY): 24 min  Charges:  $Therapeutic Activity: 8-22 mins                      Arby Barrette, PT Pager [phone]    Rexanne Mano 01/04/2020, 5:11 PM

## 2020-01-04 NOTE — Progress Notes (Addendum)
Palliative:  HPI: 64 y.o. female  with past medical history of pulmonary fibrosis 13-15L at home, pneumonia, GERD, COVID pneumonia Feb 2021 admitted on 01/03/2020 with worsening shortness of breath with pulmonary fibrosis flare. Overall prognosis poor with end stage pulmonary fibrosis and high oxygen requirements.   I met today with Stacy [name] today at her bedside and she appears with much worse fatigue and dyspnea today. She has recently had morphine dose. She shares that she did sleep well last night. I expressed that I believe she feels worse because she skipped her morning scheduled morphine and the only way to manage her severe dyspnea is to be proactive and stay on top of it with medications. RN reports that patient fears constipation so I increased senokot as well. Stacy [name] eyes were heavy from recent morphine and I encouraged her to get some rest and we can talk later. I told her that I would call her daughter Stacy [name] to discuss plan as well.   I called and spoke with Stacy [name] (and then with other children on separate call) about their mother's poor prognosis and likelihood that she will not improve but likely to continue to worsen. We discussed that she is on 25L oxygen and this cannot be provided outside the hospital. We discussed that there is a high probability she will not make it out of the hospital. This does not come as a surprise to family. Although they are sad they are supportive of her goal for comfort care. They have witnessed her suffering over these months.   Stacy [name] was concerned that her mother feels she is a burden and doesn't want her to feel this way. I explained that the feeling of being a burden to family is common and more about loss of independence and quality of life while having to rely on others that a feeling they get from family. We discussed prognosis depends on progression over the next couple days as she appears much worse today and if further decline tomorrow prognosis  could be only days. Prognosis also dependant on desire to continue interventions and increase oxygen further although this is not recommended as I feel this will only prolong her suffering and is not giving her relief from shortness of breath. I believe she will obtain more relief from medication like morphine at this stage.   All questions/concerns addressed. Emotional support provided. Discussed with Dr. Broadus John.   Exam: Fatigue and exhausted. Becoming sleepy. Tachypnea, short of breath at rest and much worse with any acitivty. Generalized weakness.   Plan: - Comfort focused care.  - Discussed with children today. Plan to confirm with Stacy [name] tomorrow plans for no escalation and also to plan for de-escalation of oxygen when mental status declines.  - Continue scheduled and prn morphine. Low threshold to increase dosage/frequency. Likely need for infusion in the near future.   [place], NP Palliative Medicine Team Pager [phone] (Please see [email or web address] for schedule) Team Phone [phone]    Greater than 50%  of this time was spent counseling and coordinating care related to the above assessment and plan

## 2020-01-04 NOTE — Progress Notes (Signed)
Patient is currently on [place] with a sat of 100%.  Patient is end stage Pulmonary Fibrosis and is moving toward comfort care.  Bipap is currently at bedside if patient feels that she needs it.  Will continue to monitor.

## 2020-01-04 NOTE — Progress Notes (Signed)
PROGRESS NOTE    Stacy [name]  GLO:[id number] DOB: 1955/09/14 DOA: 12/21/2019 PCP: Marrian Salvage, [name]  Brief Narrative: 64 year old chronically ill female with severe pulmonary fibrosis on 15 L home O2 at baseline, followed by Dr. Vaughan Browner with pulmonary and palliative medicine presented to the ED on 6/26 with worsening dyspnea on exertion, O2 sats were in the seventies on 15 L when EMS arrived, patient reported gradual worsening of symptoms over the last 3 to 4 days, with occasional cough productive of sputum, continues to have ongoing weight loss, significantly limited in ADLs by her dyspnea. -In the ED she had an ABG which noted a pH of 7.33, PCO2 of 41 and PO2 of 250 on nonrebreather mask, x-ray noted chronic findings only, she was started on BiPAP, also treated with IV Lasix, steroids and antibiotics   Assessment & Plan:   Acute on chronic hypoxic respiratory failure Severe/end-stage pulmonary fibrosis -Followed by [facility] pulmonary and palliative at baseline,  -Currently on 25 L high flow O2 via nasal cannula, attempt to wean O2 as tolerated -Remains on IV Solu-Medrol and azithromycin -Also really saved Lasix earlier this admission, appears euvolemic at this time -Pulmonary following, -Called daughter Camila Li yesterday and discussed very poor prognosis consideration of hospice and comfort focused care, palliative team consulted, plan for family meeting today  Abnormal LFTs -Could be secondary to shock liver, abdominal exam is benign, denies any pain or tenderness, bilirubin and alkaline phosphatase is normal -Avoid hypotension, trend and monitor -Improving, monitor  Gastroesophageal reflux disease -Continue PPI  History of DVT -Continue apixaban  Severe protein calorie malnutrition -Cachexia secondary to advanced pulmonary disease -Supplements as tolerated - DVT prophylaxis: Apixaban Code Status: DNR Family Communication: No family at bedside, called and  updated daughter Angela Nevin 6/28  disposition Plan:  Status is: Inpatient  Remains inpatient appropriate because:Inpatient level of care appropriate due to severity of illness   Dispo: The patient is from: Home              Anticipated d/c is to: Possibly hospice, to be determined              Anticipated d/c date is: > 3 days              Patient currently is not medically stable to d/c.  Consultants:  Pulm  Procedures:   Antimicrobials:    Subjective: -No changes, remains dyspneic, continues to be on 25 L high flow O2  Objective: Vitals:   01/04/20 0800 01/04/20 0829 01/04/20 0830 01/04/20 0831  BP: 109/78     Pulse: 83   94  Resp: 19   (!) 25  Temp: 97.6 F (36.4 C)     TempSrc: Oral     SpO2: 100% 96% 97% 96%  Weight:      Height:        Intake/Output Summary (Last 24 hours) at 01/04/2020 1107 Last data filed at 01/04/2020 1884 Gross per 24 hour  Intake 246 ml  Output 200 ml  Net 46 ml   Filed Weights   01/02/20 1800  Weight: 51.7 kg    Examination:  General exam: Chronically ill female, appears much older than stated age, awake alert oriented to self and place, mild respiratory distress, extremely cachectic CVS: S1-S2, regular rate rhythm Lungs: Bilateral crackles throughout  Abdomen: Soft, nontender, bowel sounds present Extremities: No edema Skin: No rashes on exposed skin  psychiatry:  Mood & affect appropriate.     Data Reviewed:  CBC: Recent Labs  Lab 01/02/2020 1756 12/24/2019 1819 12/31/2019 1821 01/02/20 0625 01/04/20 0404  WBC  --  20.2*  --  11.6* 12.5*  NEUTROABS  --   --   --  10.7*  --   HGB 13.6 10.5* 11.9* 9.8* 10.1*  HCT 40.0 35.3* 35.0* 34.4* 32.8*  MCV  --  76.4*  --  78.5* 73.7*  PLT  --  517*  --  358 459   Basic Metabolic Panel: Recent Labs  Lab 12/19/2019 1756 12/07/2019 1756 12/26/2019 1819 12/21/2019 1821 01/02/20 0625 01/03/20 0311 01/04/20 0404  NA 143   < > 141 140 143 143 141  K 4.1   < > 3.4* 3.2* 4.5 4.8 5.2*  CL  112*  --  105  --  105 106 104  CO2  --   --  19*  --  21* 27 27  GLUCOSE 49*  --  315*  --  121* 140* 124*  BUN 10  --  11  --  _0 CREATININE 0.60  --  0.82  --  0.92 0.75 0.71  CALCIUM  --   --  8.0*  --  8.3* 8.5* 8.7*  MG  --   --   --   --  1.8  1.8 1.9  --   PHOS  --   --   --   --  4.4  4.4  --   --    < > = values in this interval not displayed.   GFR: Estimated Creatinine Clearance: 57.2 mL/min (by C-G formula based on SCr of 0.71 mg/dL). Liver Function Tests: Recent Labs  Lab 01/02/20 0625 01/03/20 0311 01/04/20 0404  AST 1,448* 459* 231*  ALT 698* 515* 436*  ALKPHOS 44 46 57  BILITOT 0.7 0.6 0.9  PROT 6.1* 6.2* 6.2*  ALBUMIN 2.7* 2.8* 2.9*   No results for input(s): LIPASE, AMYLASE in the last 168 hours. No results for input(s): AMMONIA in the last 168 hours. Coagulation Profile: No results for input(s): INR, PROTIME in the last 168 hours. Cardiac Enzymes: Recent Labs  Lab 01/02/20 0625  CKTOTAL 86   BNP (last 3 results) Recent Labs    01/28/19 1602  PROBNP 74.0   HbA1C: Recent Labs    01/02/20 0625  HGBA1C 5.9*   CBG: Recent Labs  Lab 01/03/20 0740 01/03/20 1134 01/03/20 1708 01/03/20 2124 01/04/20 0729  GLUCAP 146* 196* 143* 94 123*   Lipid Profile: No results for input(s): CHOL, HDL, LDLCALC, TRIG, CHOLHDL, LDLDIRECT in the last 72 hours. Thyroid Function Tests: Recent Labs    01/02/20 0625  TSH 0.184*   Anemia Panel: No results for input(s): VITAMINB12, FOLATE, FERRITIN, TIBC, IRON, RETICCTPCT in the last 72 hours. Urine analysis:    Component Value Date/Time   COLORURINE YELLOW 07/21/2019 0333   APPEARANCEUR HAZY (A) 07/21/2019 0333   LABSPEC 1.013 07/21/2019 0333   PHURINE 6.0 07/21/2019 0333   GLUCOSEU NEGATIVE 07/21/2019 0333   HGBUR NEGATIVE 07/21/2019 0333   BILIRUBINUR NEGATIVE 07/21/2019 0333   KETONESUR 5 (A) 07/21/2019 0333   PROTEINUR NEGATIVE 07/21/2019 0333   NITRITE NEGATIVE 07/21/2019 0333    LEUKOCYTESUR TRACE (A) 07/21/2019 0333   Sepsis Labs: _1 (procalcitonin:4,lacticidven:4)  ) Recent Results (from the past 240 hour(s))  SARS Coronavirus 2 by RT PCR (hospital order, performed in [facility] hospital lab) Nasopharyngeal Nasopharyngeal Swab     Status: None   Collection Time: 12/12/2019  6:33 PM   Specimen:  Nasopharyngeal Swab  Result Value Ref Range Status   SARS Coronavirus 2 NEGATIVE NEGATIVE Final    Comment: (NOTE) SARS-CoV-2 target nucleic acids are NOT DETECTED.  The SARS-CoV-2 RNA is generally detectable in upper and lower respiratory specimens during the acute phase of infection. The lowest concentration of SARS-CoV-2 viral copies this assay can detect is 250 copies / mL. A negative result does not preclude SARS-CoV-2 infection and should not be used as the sole basis for treatment or other patient management decisions.  A negative result may occur with improper specimen collection / handling, submission of specimen other than nasopharyngeal swab, presence of viral mutation(s) within the areas targeted by this assay, and inadequate number of viral copies (<250 copies / mL). A negative result must be combined with clinical observations, patient history, and epidemiological information.  Fact Sheet for Patients:   StrictlyIdeas.no  Fact Sheet for Healthcare Providers: BankingDealers.co.za  This test is not yet approved or  cleared by the [place] FDA and has been authorized for detection and/or diagnosis of SARS-CoV-2 by FDA under an Emergency Use Authorization (EUA).  This EUA will remain in effect (meaning this test can be used) for the duration of the COVID-19 declaration under Section 564(b)(1) of the Act, 21 U.S.C. section 360bbb-3(b)(1), unless the authorization is terminated or revoked sooner.  Performed at [facility], [place] [address] [place], [place] [address]   MRSA PCR Screening      Status: Abnormal   Collection Time: 01/02/20 10:29 PM   Specimen: Nasal Mucosa; Nasopharyngeal  Result Value Ref Range Status   MRSA by PCR POSITIVE (A) NEGATIVE Final    Comment:        The GeneXpert MRSA Assay (FDA approved for NASAL specimens only), is one component of a comprehensive MRSA colonization surveillance program. It is not intended to diagnose MRSA infection nor to guide or monitor treatment for MRSA infections. RESULT CALLED TO, READ BACK BY AND VERIFIED WITH: LOFTIN,Q RN 0107 01/03/2020 MITCHELL,L Performed at [facility] [address] [place], [place] [address]          Radiology Studies: DG CHEST PORT 1 VIEW  Result Date: 01/03/2020 CLINICAL DATA:  Pulmonary fibrosis.  Shortness of breath EXAM: PORTABLE CHEST 1 VIEW COMPARISON:  January 01, 2020 chest radiograph and chest CT Nov 10, 2018 FINDINGS: Fibrotic change is noted throughout portions of the mid and lower lung regions bilaterally, stable. No new opacity evident. Heart is mildly enlarged with pulmonary vascularity normal. No adenopathy. No bone lesions. IMPRESSION: Stable fibrosis bilaterally. No new opacity evident. Stable cardiac prominence. No adenopathy. Electronically Signed   By: Lowella Grip III M.D.   On: 01/03/2020 07:57   VAS [place] LOWER EXTREMITY VENOUS (DVT)  Result Date: 01/04/2020  Lower Venous DVTStudy Indications: Edema.  Comparison Study: 08/27/19 previous Performing Technologist: Abram Sander RVS  Examination Guidelines: A complete evaluation includes B-mode imaging, spectral Doppler, color Doppler, and power Doppler as needed of all accessible portions of each vessel. Bilateral testing is considered an integral part of a complete examination. Limited examinations for reoccurring indications may be performed as noted. The reflux portion of the exam is performed with the patient in reverse Trendelenburg.  +---------+---------------+---------+-----------+----------+--------------+  RIGHT    CompressibilityPhasicitySpontaneityPropertiesThrombus Aging +---------+---------------+---------+-----------+----------+--------------+ CFV      Full           Yes      Yes                                 +---------+---------------+---------+-----------+----------+--------------+  SFJ      Full                                                        +---------+---------------+---------+-----------+----------+--------------+ FV Prox  Full                                                        +---------+---------------+---------+-----------+----------+--------------+ FV Mid   Full                                                        +---------+---------------+---------+-----------+----------+--------------+ FV DistalFull                                                        +---------+---------------+---------+-----------+----------+--------------+ PFV      Full                                                        +---------+---------------+---------+-----------+----------+--------------+ POP      Full           Yes      Yes                                 +---------+---------------+---------+-----------+----------+--------------+ PTV      Full                                                        +---------+---------------+---------+-----------+----------+--------------+ PERO     Full                                                        +---------+---------------+---------+-----------+----------+--------------+   +---------+---------------+---------+-----------+----------+--------------+ LEFT     CompressibilityPhasicitySpontaneityPropertiesThrombus Aging +---------+---------------+---------+-----------+----------+--------------+ CFV      Full           Yes      Yes                                 +---------+---------------+---------+-----------+----------+--------------+ SFJ      Full                                                         +---------+---------------+---------+-----------+----------+--------------+  FV Prox  Full                                                        +---------+---------------+---------+-----------+----------+--------------+ FV Mid   Full                                                        +---------+---------------+---------+-----------+----------+--------------+ FV DistalFull                                                        +---------+---------------+---------+-----------+----------+--------------+ PFV      Full                                                        +---------+---------------+---------+-----------+----------+--------------+ POP      Full           Yes      Yes                                 +---------+---------------+---------+-----------+----------+--------------+ PTV      Full                                                        +---------+---------------+---------+-----------+----------+--------------+ PERO     Full                                                        +---------+---------------+---------+-----------+----------+--------------+     Summary: BILATERAL: - No evidence of deep vein thrombosis seen in the lower extremities, bilaterally. -No evidence of popliteal cyst, bilaterally.   *See table(s) above for measurements and observations. Electronically signed by Deitra Mayo MD on 01/04/2020 at 8:51:42 AM.    Final         Scheduled Meds: . apixaban  5 mg Oral BID  . arformoterol  15 mcg Nebulization BID  . budesonide (PULMICORT) nebulizer solution  0.5 mg Nebulization BID  . Chlorhexidine Gluconate Cloth  6 each Topical Q0600  . cycloSPORINE  1 drop Both Eyes BID  . docusate sodium  100 mg Oral BID  . feeding supplement (ENSURE ENLIVE)  237 mL Oral TID BM  . folic acid  1 mg Oral Daily  . insulin aspart  0-9 Units Subcutaneous TID WC  . methylPREDNISolone (SOLU-MEDROL)  injection  60 mg Intravenous Q8H  . mirtazapine  15 mg Oral QHS  . morphine CONCENTRATE  5-10 mg Oral Q8H  . mupirocin ointment  1 application Nasal BID  . polyethylene glycol  17 g Oral Daily  . revefenacin  175 mcg Nebulization Daily  . senna  1 tablet Oral Daily  . sodium chloride flush  3 mL Intravenous Q12H  . sodium chloride flush  3 mL Intravenous Q12H  . thiamine injection  100 mg Intravenous Daily   Continuous Infusions: . sodium chloride       LOS: 3 days   Time spent: 36mn  PDomenic Polite MD [facility]   01/04/2020, 11:07 AM

## 2020-01-05 DIAGNOSIS — D638 Anemia in other chronic diseases classified elsewhere: Secondary | ICD-10-CM

## 2020-01-05 DIAGNOSIS — R7989 Other specified abnormal findings of blood chemistry: Secondary | ICD-10-CM

## 2020-01-05 DIAGNOSIS — Z86718 Personal history of other venous thrombosis and embolism: Secondary | ICD-10-CM

## 2020-01-05 DIAGNOSIS — E875 Hyperkalemia: Secondary | ICD-10-CM

## 2020-01-05 DIAGNOSIS — J8410 Pulmonary fibrosis, unspecified: Secondary | ICD-10-CM

## 2020-01-05 DIAGNOSIS — J84112 Idiopathic pulmonary fibrosis: Principal | ICD-10-CM

## 2020-01-05 DIAGNOSIS — J9621 Acute and chronic respiratory failure with hypoxia: Secondary | ICD-10-CM

## 2020-01-05 LAB — COMPREHENSIVE METABOLIC PANEL
ALT: 443 U/L — ABNORMAL HIGH (ref 0–44)
AST: 209 U/L — ABNORMAL HIGH (ref 15–41)
Albumin: 3.1 g/dL — ABNORMAL LOW (ref 3.5–5.0)
Alkaline Phosphatase: 60 U/L (ref 38–126)
Anion gap: 12 (ref 5–15)
BUN: 28 mg/dL — ABNORMAL HIGH (ref 8–23)
CO2: 27 mmol/L (ref 22–32)
Calcium: 8.8 mg/dL — ABNORMAL LOW (ref 8.9–10.3)
Chloride: 104 mmol/L (ref 98–111)
Creatinine, Ser: 0.93 mg/dL (ref 0.44–1.00)
GFR calc Af Amer: >60 mL/min (ref >60)
GFR calc non Af Amer: >60 mL/min (ref >60)
Glucose, Bld: 138 mg/dL — ABNORMAL HIGH (ref 70–99)
Potassium: 5.3 mmol/L — ABNORMAL HIGH (ref 3.5–5.1)
Sodium: 143 mmol/L (ref 135–145)
Total Bilirubin: 1 mg/dL (ref 0.3–1.2)
Total Protein: 6.5 g/dL (ref 6.5–8.1)

## 2020-01-05 LAB — GLUCOSE, CAPILLARY: Glucose-Capillary: 137 mg/dL — ABNORMAL HIGH (ref 70–99)

## 2020-01-06 NOTE — Progress Notes (Signed)
Chaplain engaged in initial visit with Stacy [name]'s daughter, sisters, and granddaughter.  Chaplain offered support and the ministries of presence and listening.  Family shared that Stacy [name] was very feisty and strong.  She was described as being just like her mother who passed about eight years ago.  Family noted that Stacy [name] had been suffering physically for awhile and then having COVID really put a strain on her.    Stacy [name]'s sister was by her side when she passed and family expressed a gratefulness for her not being alone during that time.  Daughter was saddened that she did not make it to the room in time.  Chaplain affirmed that Stacy [name] knew that she loved her.    Chaplain gave family a patient placement card and explained to them to call that number when they have more information about her future arrangements.

## 2020-01-06 NOTE — Progress Notes (Signed)
Called to room by NT for patient being tachypneic and diaphoretic. Upon assessment, patient's saturations noted to be in the 70's. Heated HFNC turned up to 35L, 100 fiO2 without any success. Patient unresponsive to sternal rub. Rapid response RN called to notify and on way to bedside. Dr. Waldron Labs paged and is also in route to beside. Within a couple minutes, patient's breathing turned agonal. Dr. Waldron Labs at bedside and called time of death at 1300. Family at bedside during event. Palliative NP to reach out to next of kin to notify and see if they would like to view patient. Chaplain called.

## 2020-01-06 NOTE — Death Summary Note (Signed)
DEATH SUMMARY   Patient Details  Name: Stacy [name] MRN: [id number] DOB: 24-Dec-1955  Admission/Discharge Information   Admit Date:  2020/01/23  Date of Death: Date of Death: 01/27/2020  Time of Death: Time of Death: 1300  Length of Stay: 4  Referring Physician: Marrian Salvage, [name]   Reason(s) for Hospitalization   Shortness of breath  Diagnoses  Preliminary cause of death:    -End-stage angiopathic pulmonary fibrosis   Secondary Diagnoses (including complications and co-morbidities):  Active Problems:   Pulmonary fibrosis (HCC)   Gastroesophageal reflux disease   Osteoporosis   Hypokalemia   Acute on chronic respiratory failure (HCC)   Protein calorie malnutrition (HCC)   Hyperglycemia   Hypoglycemia   Acute exacerbation of idiopathic pulmonary fibrosis (HCC)   Shortness of breath   Hyperkalemia   Anemia of chronic disease   Elevated LFTs   History of DVT (deep vein thrombosis)   Brief Hospital Course (including significant findings, care, treatment, and services provided and events leading to death)  Stacy [name] is a 64 y.o. year old female who chronically ill-appearing, with known severe pulmonary fibrosis, on 15 L home oxygen at baseline, as well known history of DVT requiring full anticoagulation with Eliquis, chronic hypoxic respiratory failure with difficult oxygen requirement of 15 L at baseline, and with severe protein calorie malnutrition, with BMI of 18, failure to thrive despite being on multiple appetite stimulant, patient presents due to worsening of her dyspnea, as well she was with increased hypoxia, she did require BiPAP support on admission, and she does require it intermittently during her hospital stay as well she is requiring heated high flow nasal cannula and 25 L heated high flow nasal cannula, PCCM consulted regarding her severe/end-stage pulmonary fibrosis, she was treated with IV Solu-Medrol, and treated with azithromycin, and she did  require diuresis intermittently to maintain a euvolemic volume status, as well she was noted to have elevated LFTs, shock liver , as well patient with severe pulmonary protein calorie malnutrition, with cachexia secondary to advanced pulmonary disease, pulmonary has been consulted on admission regarding her severe pulmonary fibrosis and significant increase of oxygen requirement, her pulmonary fibrosis is end-stage, and palliative medicine were consulted for goals of care discussion, they have discussed the very poor prognosis of the disease, and the nature of reversible end-stage pulmonary fibrosis, and there was a high probability patient will not make it out of the hospital, which did not come as a surprise to the family, and patient had her wishes clear about comfort care, and mainly about symptom management, and family were supportive of his decision, especially they saw her suffering over this month, patient was started on comfort focused care protocol, including as needed morphine, however plan was for no escalation of care, and de-escalation oxygen when mental status declines, patient passed away 27-Jan-2023 at 1 PM, family members were available at bedside, and they have been updated, and all their questions answered to their satisfaction.    Pertinent Labs and Studies  Significant Diagnostic Studies DG CHEST PORT 1 VIEW  Result Date: 01/03/2020 CLINICAL DATA:  Pulmonary fibrosis.  Shortness of breath EXAM: PORTABLE CHEST 1 VIEW COMPARISON:  23-Jan-2020 chest radiograph and chest CT Nov 10, 2018 FINDINGS: Fibrotic change is noted throughout portions of the mid and lower lung regions bilaterally, stable. No new opacity evident. Heart is mildly enlarged with pulmonary vascularity normal. No adenopathy. No bone lesions. IMPRESSION: Stable fibrosis bilaterally. No new opacity evident. Stable cardiac prominence. No  adenopathy. Electronically Signed   By: Lowella Grip III M.D.   On: 01/03/2020 07:57   DG  Chest Portable 1 View  Result Date: 12/24/2019 CLINICAL DATA:  Severe shortness of breath hypoxia. Pulmonary fibrosis. EXAM: PORTABLE CHEST 1 VIEW COMPARISON:  09/07/2019 FINDINGS: Cardiomegaly remains stable. Coarse pulmonary opacity is again seen which predominates peripheral and lower lung zones. This is unchanged in appearance and consistent with chronic interstitial fibrosis. No evidence of acute or superimposed pulmonary infiltrate or pleural effusion. No evidence of pneumothorax. IMPRESSION: Stable cardiomegaly and chronic pulmonary interstitial fibrosis. No acute findings. Electronically Signed   By: Marlaine Hind M.D.   On: 12/14/2019 17:40   VAS [place] LOWER EXTREMITY VENOUS (DVT)  Result Date: 01/04/2020  Lower Venous DVTStudy Indications: Edema.  Comparison Study: 08/27/19 previous Performing Technologist: Abram Sander RVS  Examination Guidelines: A complete evaluation includes B-mode imaging, spectral Doppler, color Doppler, and power Doppler as needed of all accessible portions of each vessel. Bilateral testing is considered an integral part of a complete examination. Limited examinations for reoccurring indications may be performed as noted. The reflux portion of the exam is performed with the patient in reverse Trendelenburg.  +---------+---------------+---------+-----------+----------+--------------+ RIGHT    CompressibilityPhasicitySpontaneityPropertiesThrombus Aging +---------+---------------+---------+-----------+----------+--------------+ CFV      Full           Yes      Yes                                 +---------+---------------+---------+-----------+----------+--------------+ SFJ      Full                                                        +---------+---------------+---------+-----------+----------+--------------+ FV Prox  Full                                                        +---------+---------------+---------+-----------+----------+--------------+  FV Mid   Full                                                        +---------+---------------+---------+-----------+----------+--------------+ FV DistalFull                                                        +---------+---------------+---------+-----------+----------+--------------+ PFV      Full                                                        +---------+---------------+---------+-----------+----------+--------------+ POP      Full           Yes      Yes                                 +---------+---------------+---------+-----------+----------+--------------+  PTV      Full                                                        +---------+---------------+---------+-----------+----------+--------------+ PERO     Full                                                        +---------+---------------+---------+-----------+----------+--------------+   +---------+---------------+---------+-----------+----------+--------------+ LEFT     CompressibilityPhasicitySpontaneityPropertiesThrombus Aging +---------+---------------+---------+-----------+----------+--------------+ CFV      Full           Yes      Yes                                 +---------+---------------+---------+-----------+----------+--------------+ SFJ      Full                                                        +---------+---------------+---------+-----------+----------+--------------+ FV Prox  Full                                                        +---------+---------------+---------+-----------+----------+--------------+ FV Mid   Full                                                        +---------+---------------+---------+-----------+----------+--------------+ FV DistalFull                                                        +---------+---------------+---------+-----------+----------+--------------+ PFV      Full                                                         +---------+---------------+---------+-----------+----------+--------------+ POP      Full           Yes      Yes                                 +---------+---------------+---------+-----------+----------+--------------+ PTV      Full                                                        +---------+---------------+---------+-----------+----------+--------------+  PERO     Full                                                        +---------+---------------+---------+-----------+----------+--------------+     Summary: BILATERAL: - No evidence of deep vein thrombosis seen in the lower extremities, bilaterally. -No evidence of popliteal cyst, bilaterally.   *See table(s) above for measurements and observations. Electronically signed by Deitra Mayo MD on 01/04/2020 at 8:51:42 AM.    Final     Microbiology Recent Results (from the past 240 hour(s))  SARS Coronavirus 2 by RT PCR (hospital order, performed in [facility] hospital lab) Nasopharyngeal Nasopharyngeal Swab     Status: None   Collection Time: 12/12/2019  6:33 PM   Specimen: Nasopharyngeal Swab  Result Value Ref Range Status   SARS Coronavirus 2 NEGATIVE NEGATIVE Final    Comment: (NOTE) SARS-CoV-2 target nucleic acids are NOT DETECTED.  The SARS-CoV-2 RNA is generally detectable in upper and lower respiratory specimens during the acute phase of infection. The lowest concentration of SARS-CoV-2 viral copies this assay can detect is 250 copies / mL. A negative result does not preclude SARS-CoV-2 infection and should not be used as the sole basis for treatment or other patient management decisions.  A negative result may occur with improper specimen collection / handling, submission of specimen other than nasopharyngeal swab, presence of viral mutation(s) within the areas targeted by this assay, and inadequate number of viral copies (<250 copies / mL). A negative result must be combined  with clinical observations, patient history, and epidemiological information.  Fact Sheet for Patients:   StrictlyIdeas.no  Fact Sheet for Healthcare Providers: BankingDealers.co.za  This test is not yet approved or  cleared by the [place] FDA and has been authorized for detection and/or diagnosis of SARS-CoV-2 by FDA under an Emergency Use Authorization (EUA).  This EUA will remain in effect (meaning this test can be used) for the duration of the COVID-19 declaration under Section 564(b)(1) of the Act, 21 U.S.C. section 360bbb-3(b)(1), unless the authorization is terminated or revoked sooner.  Performed at [facility], [place] [address] [place], [place] [address]   MRSA PCR Screening     Status: Abnormal   Collection Time: 01/02/20 10:29 PM   Specimen: Nasal Mucosa; Nasopharyngeal  Result Value Ref Range Status   MRSA by PCR POSITIVE (A) NEGATIVE Final    Comment:        The GeneXpert MRSA Assay (FDA approved for NASAL specimens only), is one component of a comprehensive MRSA colonization surveillance program. It is not intended to diagnose MRSA infection nor to guide or monitor treatment for MRSA infections. RESULT CALLED TO, READ BACK BY AND VERIFIED WITH: LOFTIN,Q RN 0107 01/03/2020 MITCHELL,L Performed at [facility] [address] [place], [place] [address]     Lab Basic Metabolic Panel: Recent Labs  Lab 12/24/2019 1819 01/04/2020 1819 12/09/2019 1821 01/02/20 0625 01/03/20 0311 01/04/20 0404 January 18, 2020 0501  NA 141   < > 140 143 143 141 143  K 3.4*   < > 3.2* 4.5 4.8 5.2* 5.3*  CL 105  --   --  105 106 104 104  CO2 19*  --   --  21* _0 GLUCOSE 315*  --   --  121*  140* 124* 138*  BUN 11  --   --  _0 28*  CREATININE 0.82  --   --  0.92 0.75 0.71 0.93  CALCIUM 8.0*  --   --  8.3* 8.5* 8.7* 8.8*  MG  --   --   --  1.8  1.8 1.9  --   --   PHOS  --   --   --  4.4  4.4  --   --   --     < > = values in this interval not displayed.   Liver Function Tests: Recent Labs  Lab 01/02/20 0625 01/03/20 0311 01/04/20 0404 01/12/20 0501  AST 1,448* 459* 231* 209*  ALT 698* 515* 436* 443*  ALKPHOS 44 46 57 60  BILITOT 0.7 0.6 0.9 1.0  PROT 6.1* 6.2* 6.2* 6.5  ALBUMIN 2.7* 2.8* 2.9* 3.1*   No results for input(s): LIPASE, AMYLASE in the last 168 hours. No results for input(s): AMMONIA in the last 168 hours. CBC: Recent Labs  Lab 12/25/2019 1756 12/24/2019 1819 12/26/2019 1821 01/02/20 0625 01/04/20 0404  WBC  --  20.2*  --  11.6* 12.5*  NEUTROABS  --   --   --  10.7*  --   HGB 13.6 10.5* 11.9* 9.8* 10.1*  HCT 40.0 35.3* 35.0* 34.4* 32.8*  MCV  --  76.4*  --  78.5* 73.7*  PLT  --  517*  --  358 365   Cardiac Enzymes: Recent Labs  Lab 01/02/20 0625  CKTOTAL 86   Sepsis Labs: Recent Labs  Lab 12/11/2019 1819 01/02/20 0625 01/04/20 0404  WBC 20.2* 11.6* 12.5*    Procedures/Operations     Stacy [name] 01-12-20, 4:40 PM

## 2020-01-06 NOTE — Progress Notes (Signed)
Palliative:  I came to visit with Stacy [name] this morning but she was resting comfortably and has had so much respiratory distress I felt she needed the rest and relief. I discussed symptom management and plan of care with RN.   I was called to bedside by RN later in the day noting that Stacy [name] has died. Dr. Waldron Labs pronounced death at 26. Stacy [name] was comfortable and peaceful this morning. I called her daughter, Stacy [name], and told her that her mother has died. Stacy [name] was entering the hospital when I was speaking with her and is sad that she did not make it in time before her mother died. I called chaplain to meet with daughter for additional support.   This was an expected death and Stacy [name]'s goals were for comfort. I had discussions with family yesterday who respected her wishes and confirmed DNR status. They have been witness to her suffering. I was clear that we did not expect her to survive this hospitalization. Although we were all hopeful she would have more time, at least a couple days, this was not unexpected. Stacy [name] is finally at peace.   No charge  Vinie Sill, NP Palliative Medicine Team Pager [phone] (Please see [email or web address] for schedule) Team Phone [phone]

## 2020-01-06 NOTE — Progress Notes (Signed)
   NAME:  Stacy [name], MRN:  [id number], DOB:  August 24, 1955, LOS: 4 ADMISSION DATE:  12/22/2019, CONSULTATION DATE:  6/26 REFERRING MD:  Dr. Alvino Chapel, CHIEF COMPLAINT:  SOB   Brief History   64 y/o F with severe pulmonary fibrosis admitted 6/26 with worsening shortness of breath.  Suspected ILD flare.     Past Medical History  Pulmonary fibrosis - baseline 15L O2 GERD Palliative Care - seen by [facility], DNR COVID - on 4L in Jan 2021, then had COVID, now on humidified high flow nasal cannula  Significant Hospital Events   6/26 Admit   Consults:    Procedures:    Significant Diagnostic Tests:  CXR 6/26 >> no acute process, chronic interstitial fibrosis, cardiomegaly   Micro Data:  COVID 6/26 >>   Antimicrobials:  Azithromycin 6/26 >>   Interim history/subjective:  No acute events overnight  States breathing is improved  Still very short of breath with no reserves for any activity  Objective   Blood pressure (!) 89/77, pulse (!) 101, temperature 97.6 F (36.4 C), temperature source Axillary, resp. rate 20, height _0  (1.676 m), weight 51.7 kg, SpO2 95 %.    FiO2 (%):  [80 %] 80 %   Intake/Output Summary (Last 24 hours) at January 11, 2020 1153 Last data filed at 01-11-2020 0540 Gross per 24 hour  Intake 240 ml  Output 300 ml  Net -60 ml   Filed Weights   01/02/20 1800  Weight: 51.7 kg    Examination: General: On oxygen supplementation, does appear comfortable HEENT: MM pink/dry, bipap mask in place, oral care performed while in room, good dentition  Neuro: AAOx4, speech clear, MAE CV: s1s2 rrr, no m/r/g PULM: Bilateral crackles, no change from previous GI: soft, bsx4 active  Extremities: warm/dry, trace to 1+ BLE pitting edema  Skin: no rashes or lesions  Resolved Hospital Problem list     Assessment & Plan:   End Stage Pulmonary Fibrosis / ILD with Acute Exacerbation  Acute on Chronic Hypoxic Respiratory Failure  Hx COVID  Anxiety  Weight Loss /  At Risk Malnutrition  Hypoglycemia   -continue azithromycin -solumedrol-increased to 125 every 8 -BiPAP PRN for increased work of breathing / patient comfort  -continue brovana, pulmicort, yupelri -PRN morphine, ativan for dyspnea, anxiety  -DNR / DNI > no intubation, confirmed with patient / family  -Appreciate palliative care involvement   Very high mortality with acute exacerbation of pulmonary fibrosis regardless of intervention, unfortunately. Appears to be stable last 24 hours, still very short of breath Current focus is on comfort  Will continue to follow with you  Sherrilyn Rist, MD [place] PCCM Pager: [phone]

## 2020-01-06 NOTE — Significant Event (Signed)
Rapid Response Event Note  Received a call from staff with concerns of patient having low oxygen saturations and in respiratory distress on HHFNC. I asked the staff to page the MD on call and Palliative Care NP and that I was coming as soon I could (I was with another patient).  Upon my arrival, MD had pronounced Time of Death. Patient was DNR   Event Summary:  Call Time 1254 Arrival Time Stacy [name]  Stacy [name]

## 2020-01-06 DEATH — deceased

## 2020-01-19 NOTE — Telephone Encounter (Signed)
Please advise if this form has been done.

## 2020-01-19 NOTE — Telephone Encounter (Signed)
Dr Vaughan Browner has form.  Will update once form is received and faxed.

## 2020-01-20 NOTE — Telephone Encounter (Signed)
Forms completed and signed by Dr Vaughan Browner.  Forms faxed this afternoon to [employer]. Nothing further needed.

## 2020-01-25 ENCOUNTER — Ambulatory Visit: Payer: Medicare HMO | Admitting: Pulmonary Disease

## 2020-05-04 IMAGING — CT CT ANGIO CHEST
2 of 3 series · 17 of 31 positions shown · IV contrast (OMNIPAQUE)
Comparison: 07/20/2019

CLINICAL DATA: Shortness of breath, history of pulmonary fibrosis

EXAM:
CT ANGIOGRAPHY CHEST WITH CONTRAST
TECHNIQUE: Multidetector CT imaging of the chest was performed using the
standard protocol during bolus administration of intravenous
contrast. Multiplanar CT image reconstructions and MIPs were
obtained to evaluate the vascular anatomy.
CONTRAST:  80mL OMNIPAQUE IOHEXOL 350 MG/ML SOLN

[Series 5: pe chest · axial · 0.74mm/px · z∈[-275,-45]mm · 8 of 155 slices shown]
[im 20/155  lung]
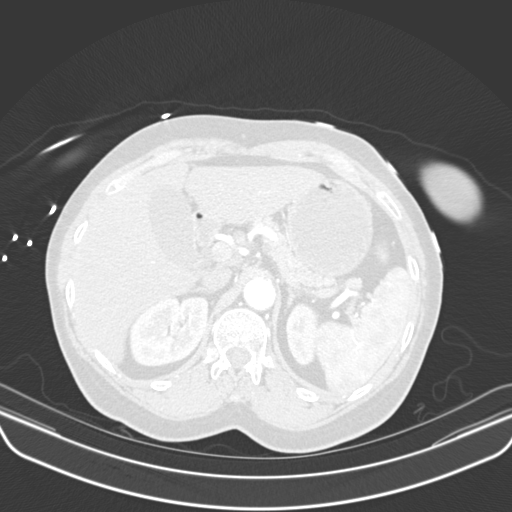
[im 39/155  lung]
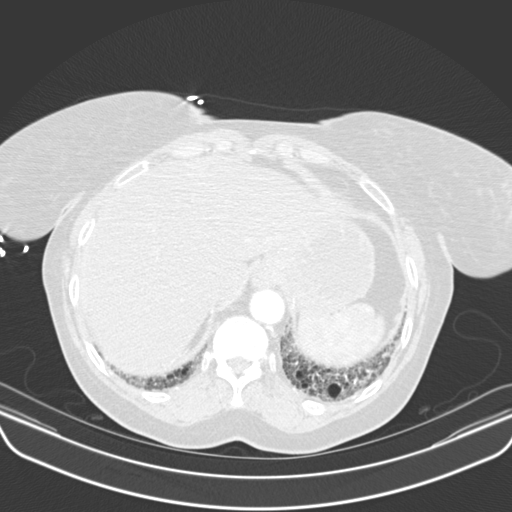
[im 58/155  lung]
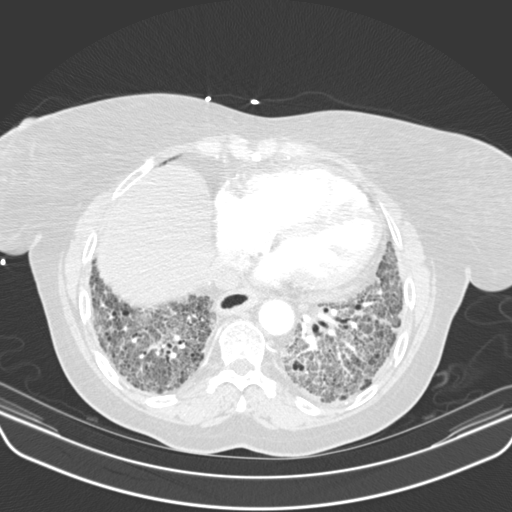
[im 73/155  lung]
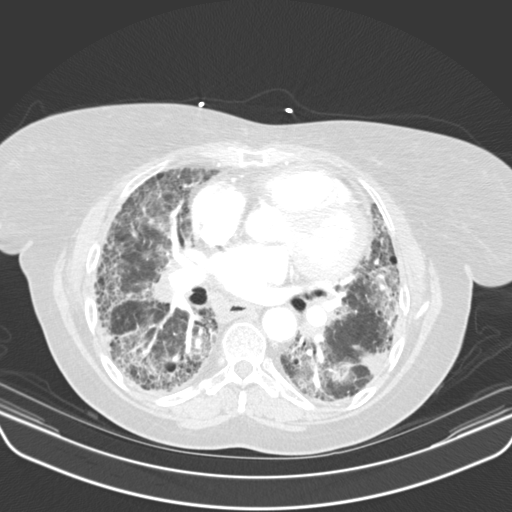
[im 78/155  lung]
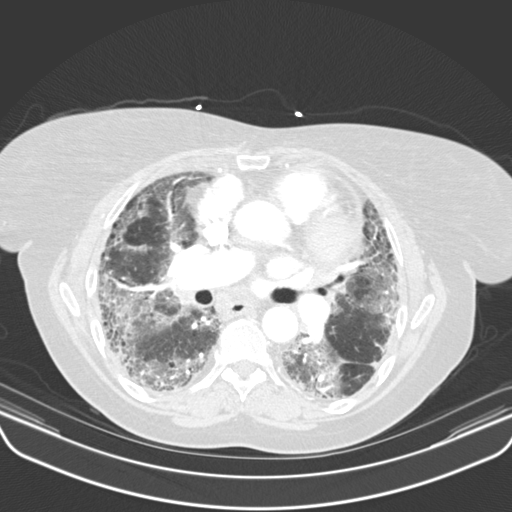
[im 97/155  lung]
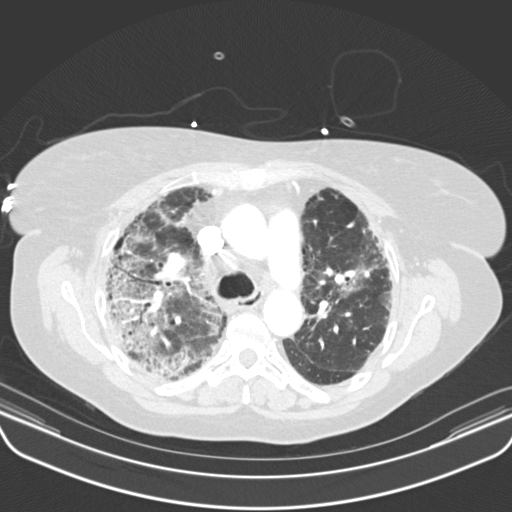
[im 116/155  lung]
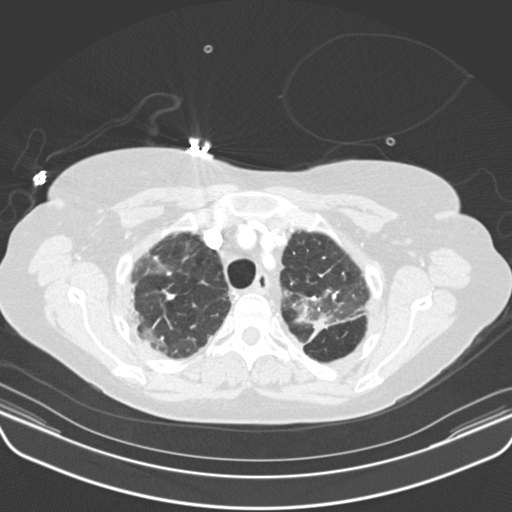
[im 135/155  lung]
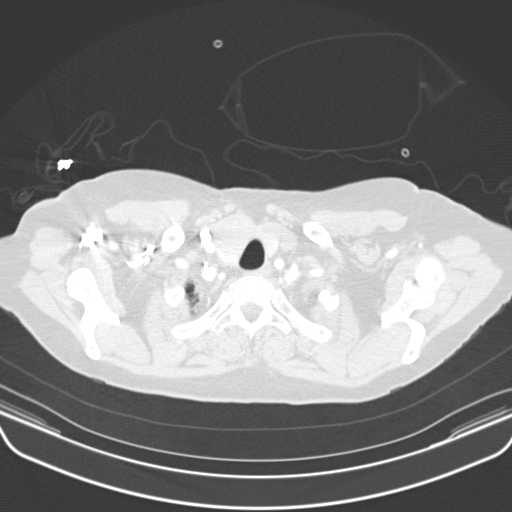

[Series 6: (person_name) thins · axial · 0.74mm/px · z∈[-279,-41]mm · 9 of 155 slices shown]
[im 18/155  lung]
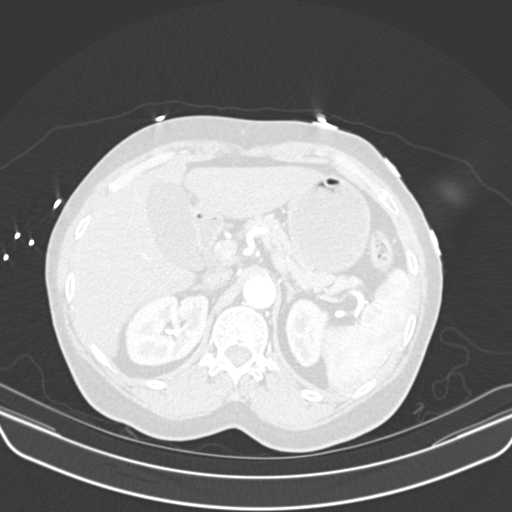
[im 35/155  mediastinal]
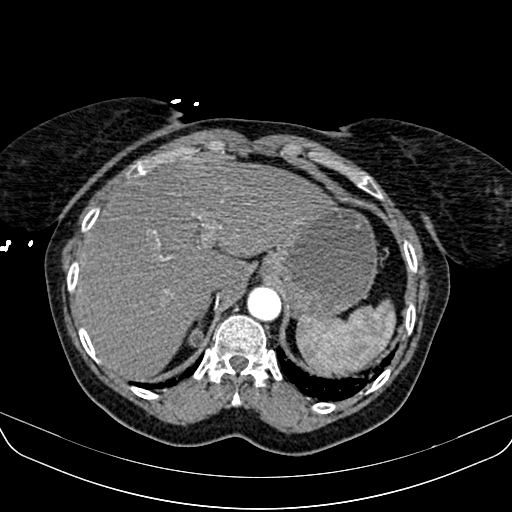
[im 52/155  lung]
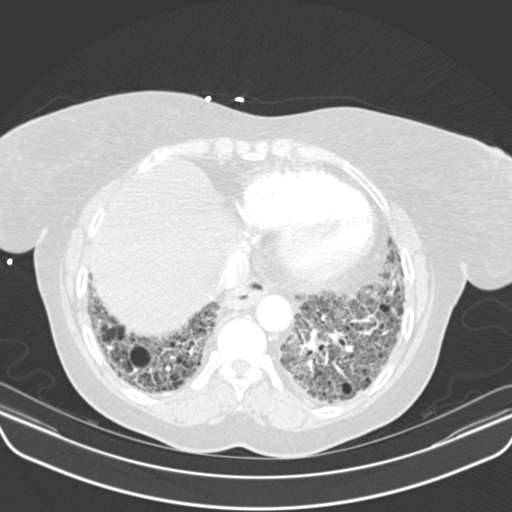
[im 69/155  mediastinal]
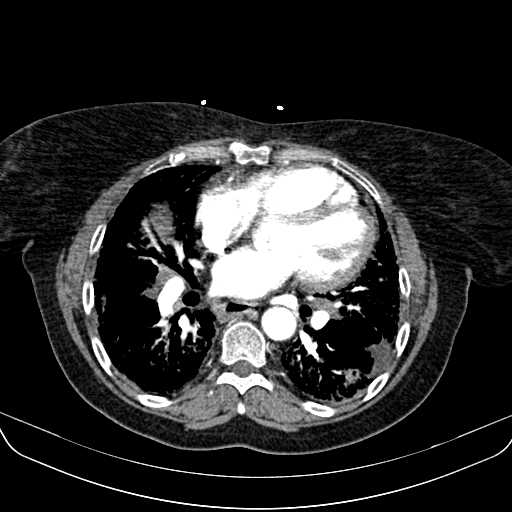
[im 73/155  lung]
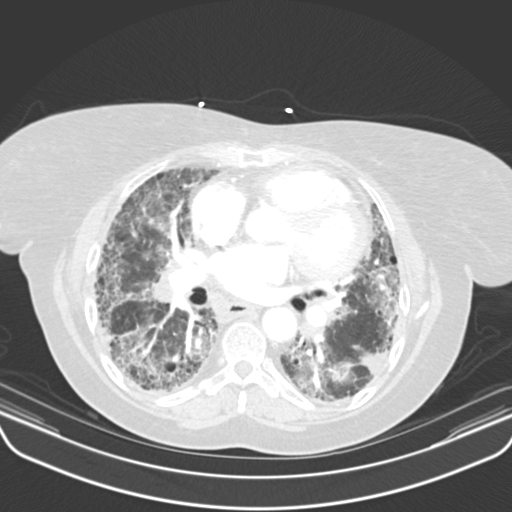
[im 86/155  mediastinal]
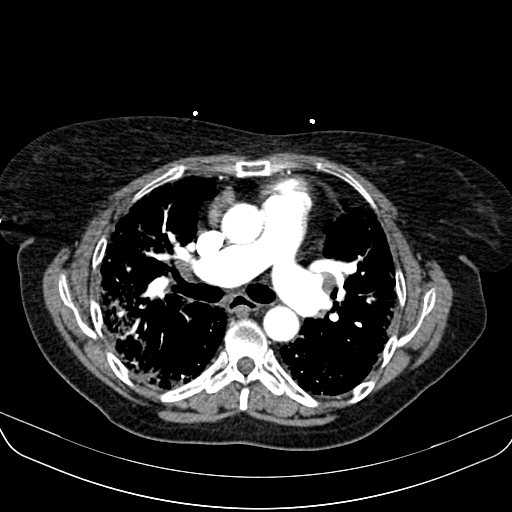
[im 103/155  lung]
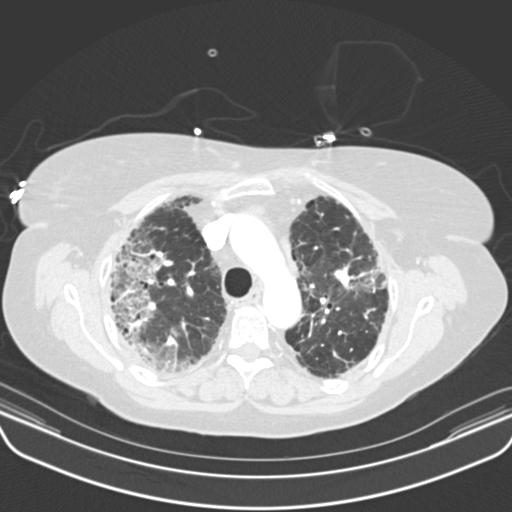
[im 120/155  mediastinal]
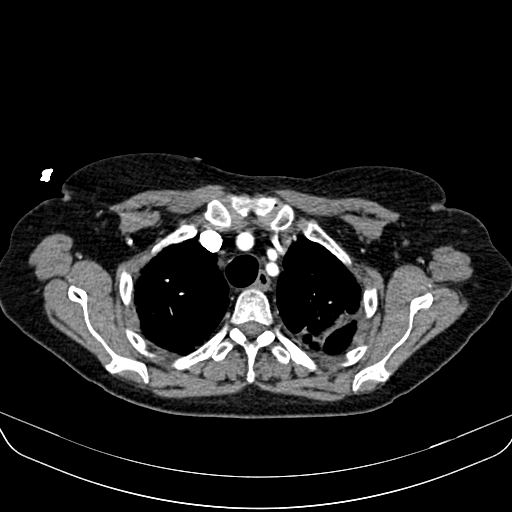
[im 137/155  lung]
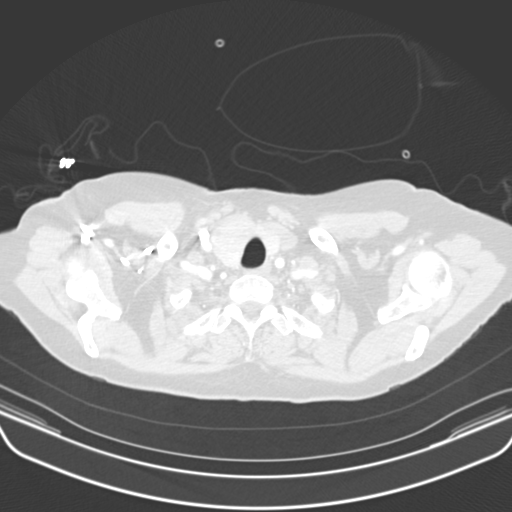

[17 of 31 positions shown; findings below may reference images not displayed]

FINDINGS: Cardiovascular: Satisfactory opacification the bilateral pulmonary
arteries to the segmental level. No evidence of pulmonary embolism.
Enlargement of the pulmonary arteries, suggesting pulmonary arterial
hypertension.

No evidence of thoracic aortic aneurysm or dissection.
Atherosclerotic calcifications of the aortic arch.

The heart is top-normal in size.  No pericardial effusion.

Mediastinum/Nodes: Small mediastinal lymph nodes, including a
dominant 12 mm short axis subcarinal node (series 5/image 77,
previously 11 mm. Bilateral hilar/perihilar lymphadenopathy,
measuring up to 13 mm short axis on the right, previously 12 mm.
This appearance is favored to be reactive.

Visualized right thyroid is mildly enlarged/nodular.

Lungs/Pleura: Residual post infectious/inflammatory scarring in the
left upper lobe (series 7/image 38).

Multifocal patchy/ground-glass opacities with subpleural
reticulation in the lungs bilaterally, lower lobe predominant,
compatible with pulmonary fibrosis.

Trace loculated pleural fluid along the left major fissure,
minimally increased from the prior.

No suspicious pulmonary nodules.

No pneumothorax.

Upper Abdomen: Visualized upper abdomen is notable for a dominant
anterior right upper pole renal cyst measuring at least 6.5 cm,
incompletely visualized.

Musculoskeletal: Visualized osseous structures are within normal
limits.

Review of the MIP images confirms the above findings.
IMPRESSION: No evidence of pulmonary embolism.

Pulmonary fibrosis, unchanged.  Suspected reactive lymphadenopathy.

Prior left upper lobe pneumonia has resolved, with mild residual
post infectious/inflammatory scarring.

Suspected pulmonary arterial hypertension.

Aortic Atherosclerosis ([id number]).

## 2020-07-23 IMAGING — CT CT CHEST HIGH RESOLUTION W/O CM
2 of 7 series · 14 of 36 positions shown, 17 images · non-contrast
Comparison: 08/22/2019 chest CT angiogram. 11/23/2018
high-resolution chest CT.

CLINICAL DATA: Follow-up idiopathic pulmonary fibrosis. Recent
pneumonia. Dyspnea. Continuous oxygen supplementation. [id number]
positive in August 2019. Former smoker.

EXAM:
CT CHEST WITHOUT CONTRAST
TECHNIQUE: Multidetector CT imaging of the chest was performed following the
standard protocol without intravenous contrast. High resolution
imaging of the lungs, as well as inspiratory and expiratory imaging,
was performed.

[Series 4: inspiration hi-res 2.00 br36 s3 · coronal · 0.59mm/px · 3 of 182 slices shown]
[im 37/182  lung]
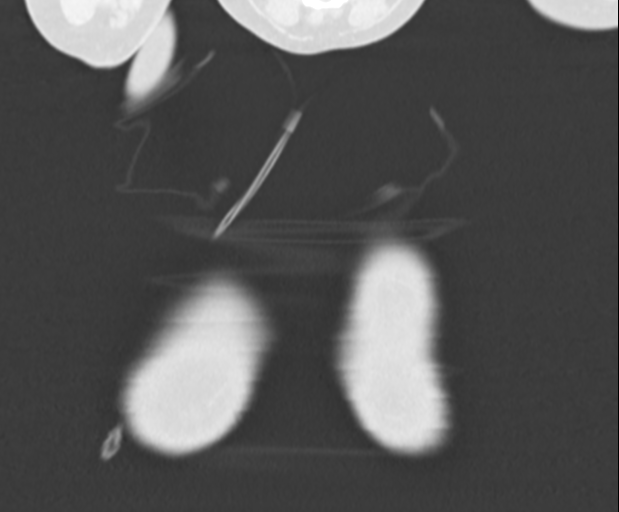
[im 73/182  lung]
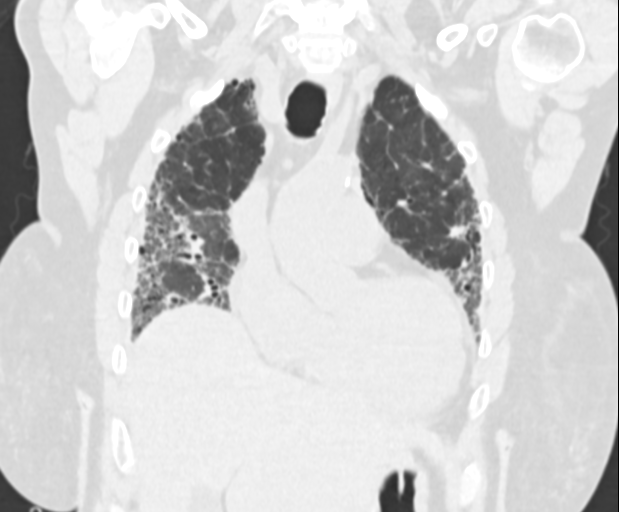
[im 109/182  lung]
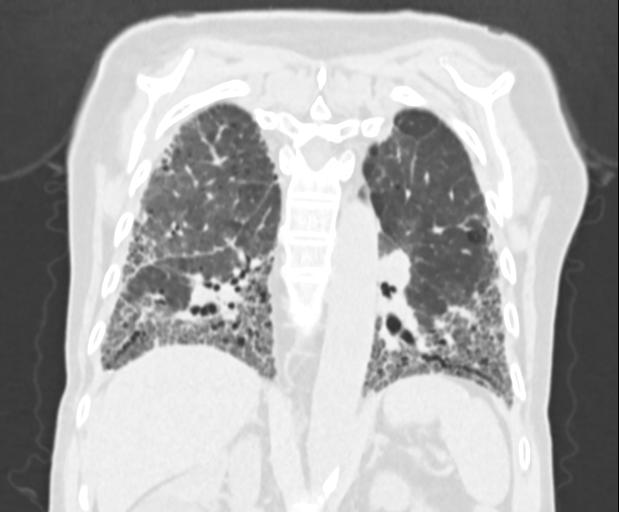

[Series 11: inspiration hi-res 1.00 br60 s3 high res thins 1x1 · axial · 0.71mm/px · z∈[+1594,+1848]mm · 11 of 302 slices shown, 14 images]
[im 24/302  mediastinal]
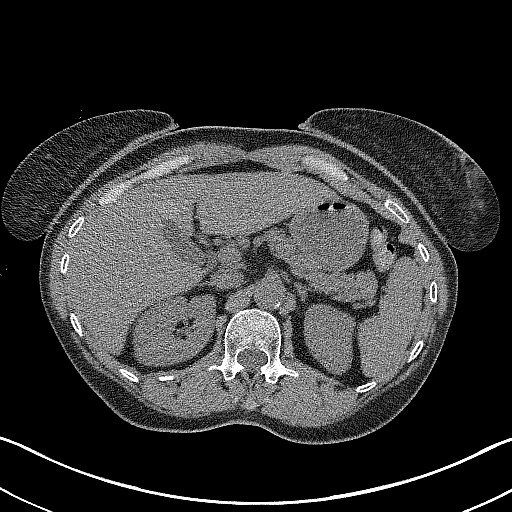
[im 24/302  lung]
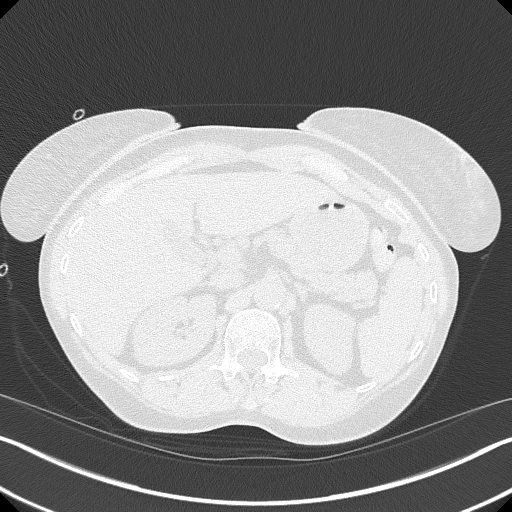
[im 47/302  lung]
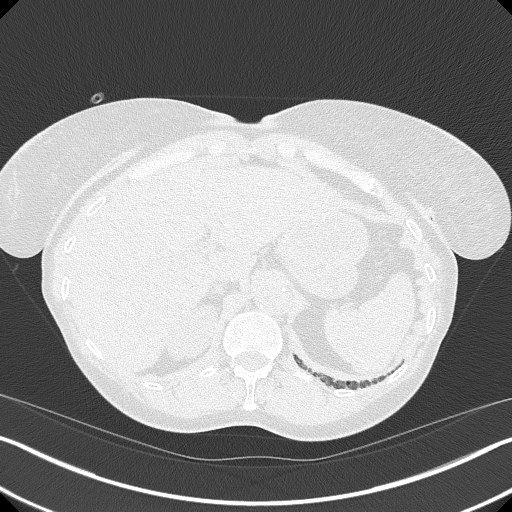
[im 70/302  lung]
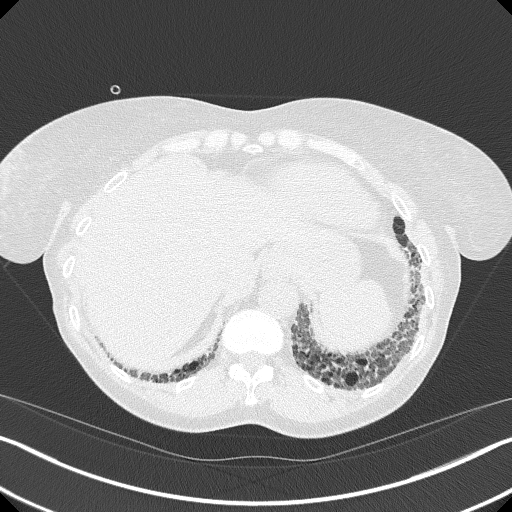
[im 93/302  lung]
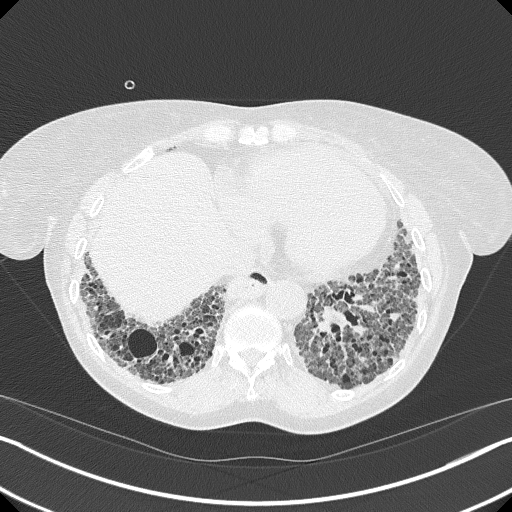
[im 116/302  mediastinal]
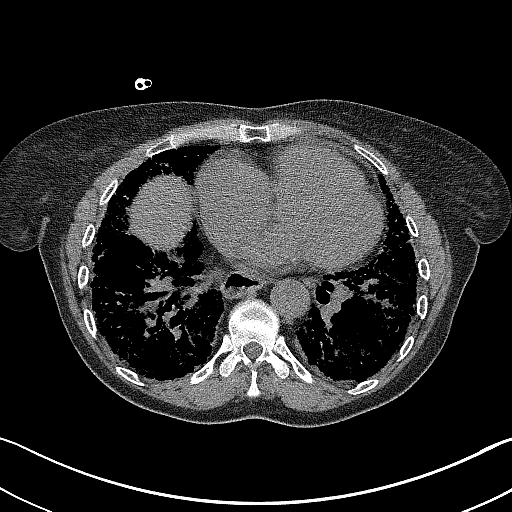
[im 116/302  lung]
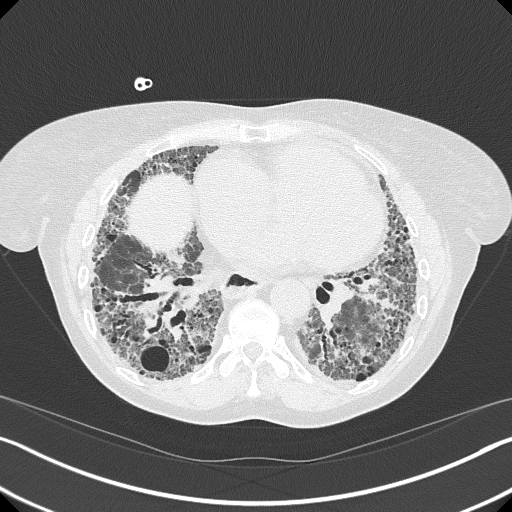
[im 163/302  lung]
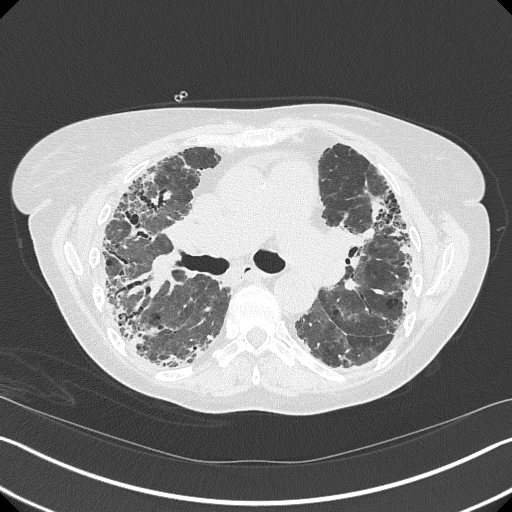
[im 186/302  lung]
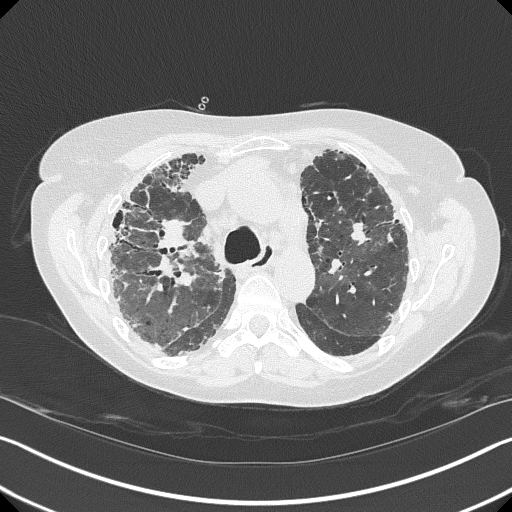
[im 209/302  lung]
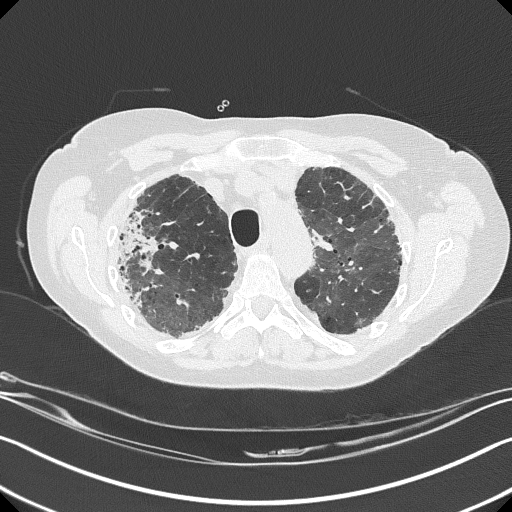
[im 232/302  mediastinal]
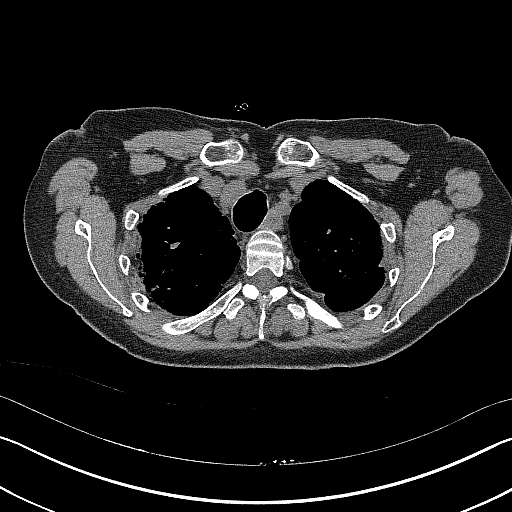
[im 232/302  lung]
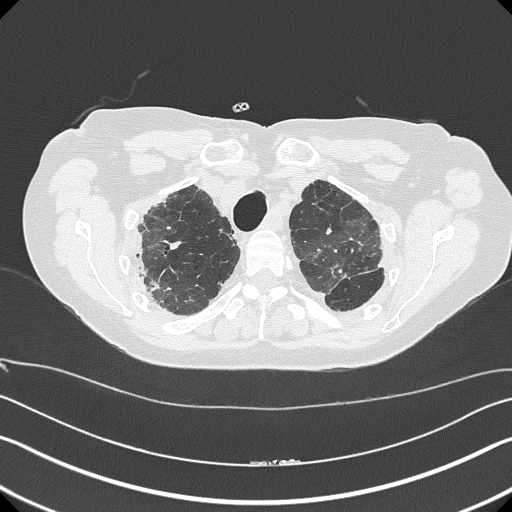
[im 255/302  lung]
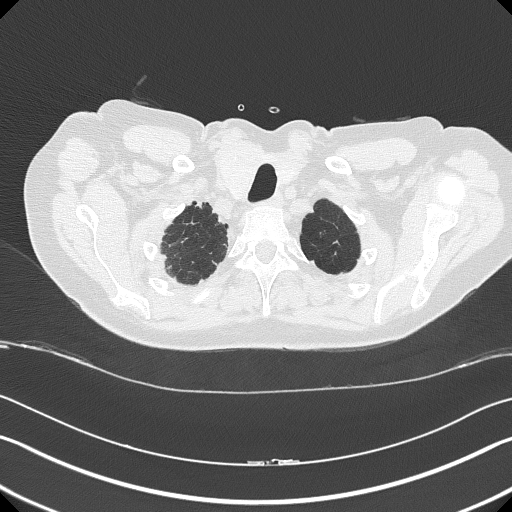
[im 278/302  lung]
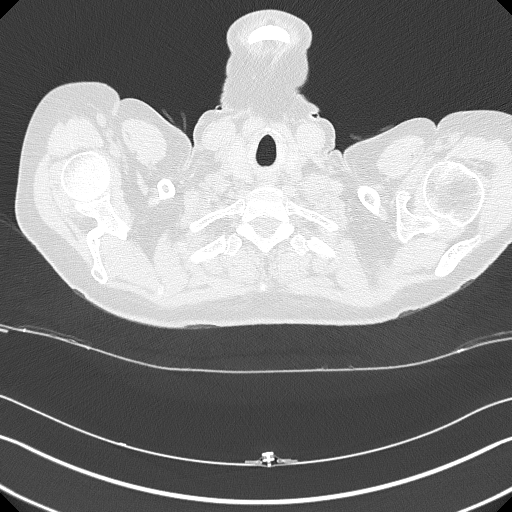

[14 of 36 positions shown; findings below may reference images not displayed]

FINDINGS: Cardiovascular: Mild cardiomegaly, stable. Trace pericardial
effusion/thickening, stable. Mildly atherosclerotic nonaneurysmal
thoracic aorta. Stable dilated main pulmonary artery (3.6 cm
diameter).

Mediastinum/Nodes: Stable mildly enlarged heterogeneous thyroid
without discrete thyroid nodules, asymmetrically enlarged on the
right. This has been documented on prior studies (ref: [HOSPITAL]. [DATE]): 143-50). Unremarkable esophagus. No axillary
adenopathy. Enlarged 1.9 cm right paratracheal node (series 2/image
63), not substantially changed since 08/22/2019 chest CT where it
measured 1.8 cm. Stable mildly enlarged 1.2 cm right subcarinal node
(series 2/image 82). Mild bilateral hilar adenopathy is
qualitatively stable, poorly delineated on these noncontrast images.

Lungs/Pleura: No pneumothorax. No pleural effusion. No acute
consolidative airspace disease, lung masses or significant pulmonary
nodules. There is extensive patchy confluent peripheral
peribronchovascular and subpleural reticulation and ground-glass
opacity throughout both lungs with associated mild traction
bronchiectasis and architectural distortion. There is a basilar
predominance to these findings. No significant lobular air trapping
on the expiration sequence. No frank honeycombing. Findings have
clearly progressed since 11/23/2018 high-resolution chest CT study.

Upper abdomen: Small hiatal hernia. Subcentimeter hypodense anterior
liver lesion is too small to characterize and is unchanged.
Exophytic simple 5 cm anterior interpolar right renal cyst.

Musculoskeletal: No aggressive appearing focal osseous lesions. Mild
thoracic spondylosis.
IMPRESSION: 1. Spectrum of findings compatible with basilar predominant fibrotic
interstitial lung disease without frank honeycombing, progressive
since 11/23/2018 high-resolution chest CT study. Findings are
consistent with UIP per consensus guidelines: Diagnosis of
Idiopathic Pulmonary Fibrosis: An Official ATS/ERS/JRS/ALAT Clinical
Practice Guideline. Am J Respir Crit Care Med Vol 198, Auad 5,
ppe11-e[DATE].
2. Nonspecific mild-to-moderate mediastinal and mild bilateral hilar
lymphadenopathy, not substantially changed in the interval, most
compatible with benign reactive adenopathy.
3. Mild cardiomegaly, stable. Stable dilated main pulmonary artery,
suggesting pulmonary arterial hypertension.
4. Small hiatal hernia.
5. Aortic Atherosclerosis ([id number]).

## 2020-09-13 IMAGING — DX DG CHEST 1V PORT
1 series · 1 of 1 positions shown · non-contrast
Comparison: 09/07/2019

CLINICAL DATA: Severe shortness of breath hypoxia. Pulmonary
fibrosis.

EXAM:
PORTABLE CHEST 1 VIEW

[chest]
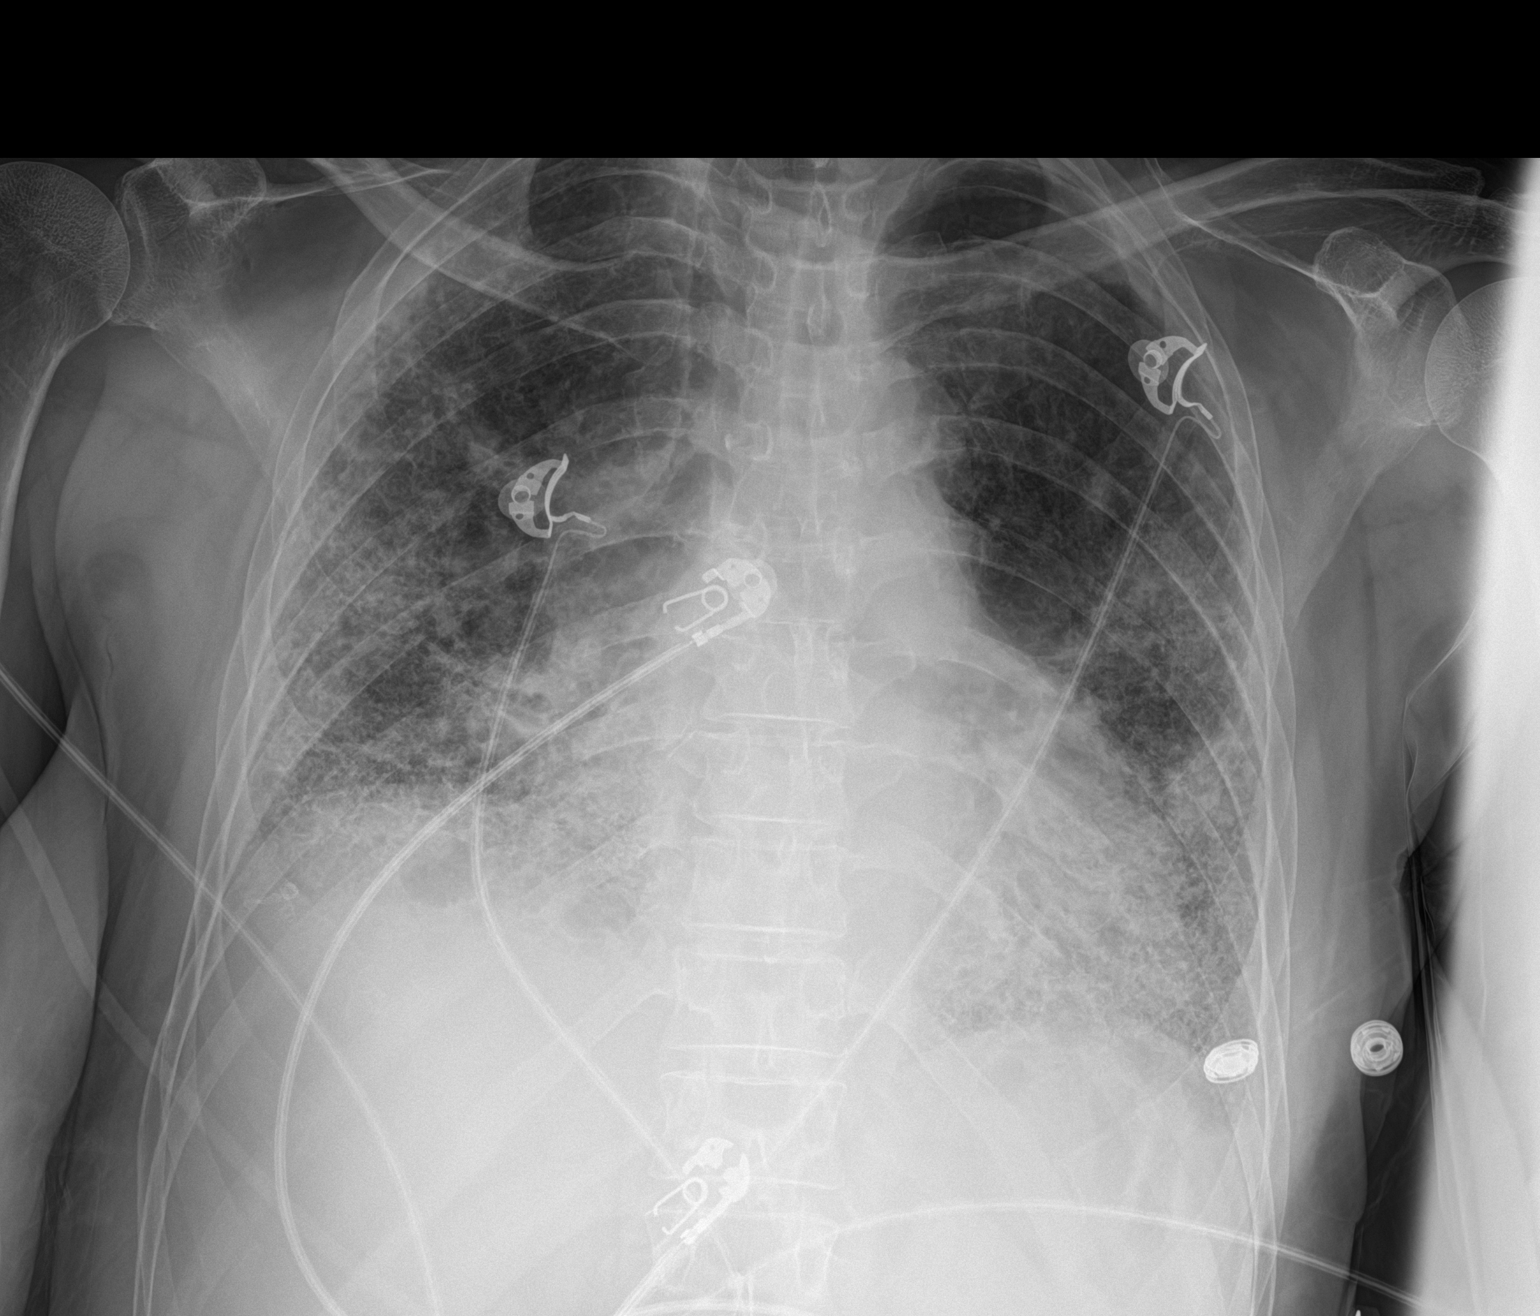

[1 of 1 positions shown; findings below may reference images not displayed]

FINDINGS: Cardiomegaly remains stable. Coarse pulmonary opacity is again seen
which predominates peripheral and lower lung zones. This is
unchanged in appearance and consistent with chronic interstitial
fibrosis. No evidence of acute or superimposed pulmonary infiltrate
or pleural effusion. No evidence of pneumothorax.
IMPRESSION: Stable cardiomegaly and chronic pulmonary interstitial fibrosis. No
acute findings.

## 2020-09-15 IMAGING — DX DG CHEST 1V PORT
1 series · 1 of 1 positions shown · non-contrast
Comparison: January 01, 2020 chest radiograph and chest CT November 10, 2018

CLINICAL DATA: Pulmonary fibrosis.  Shortness of breath

EXAM:
PORTABLE CHEST 1 VIEW

[chest ap]
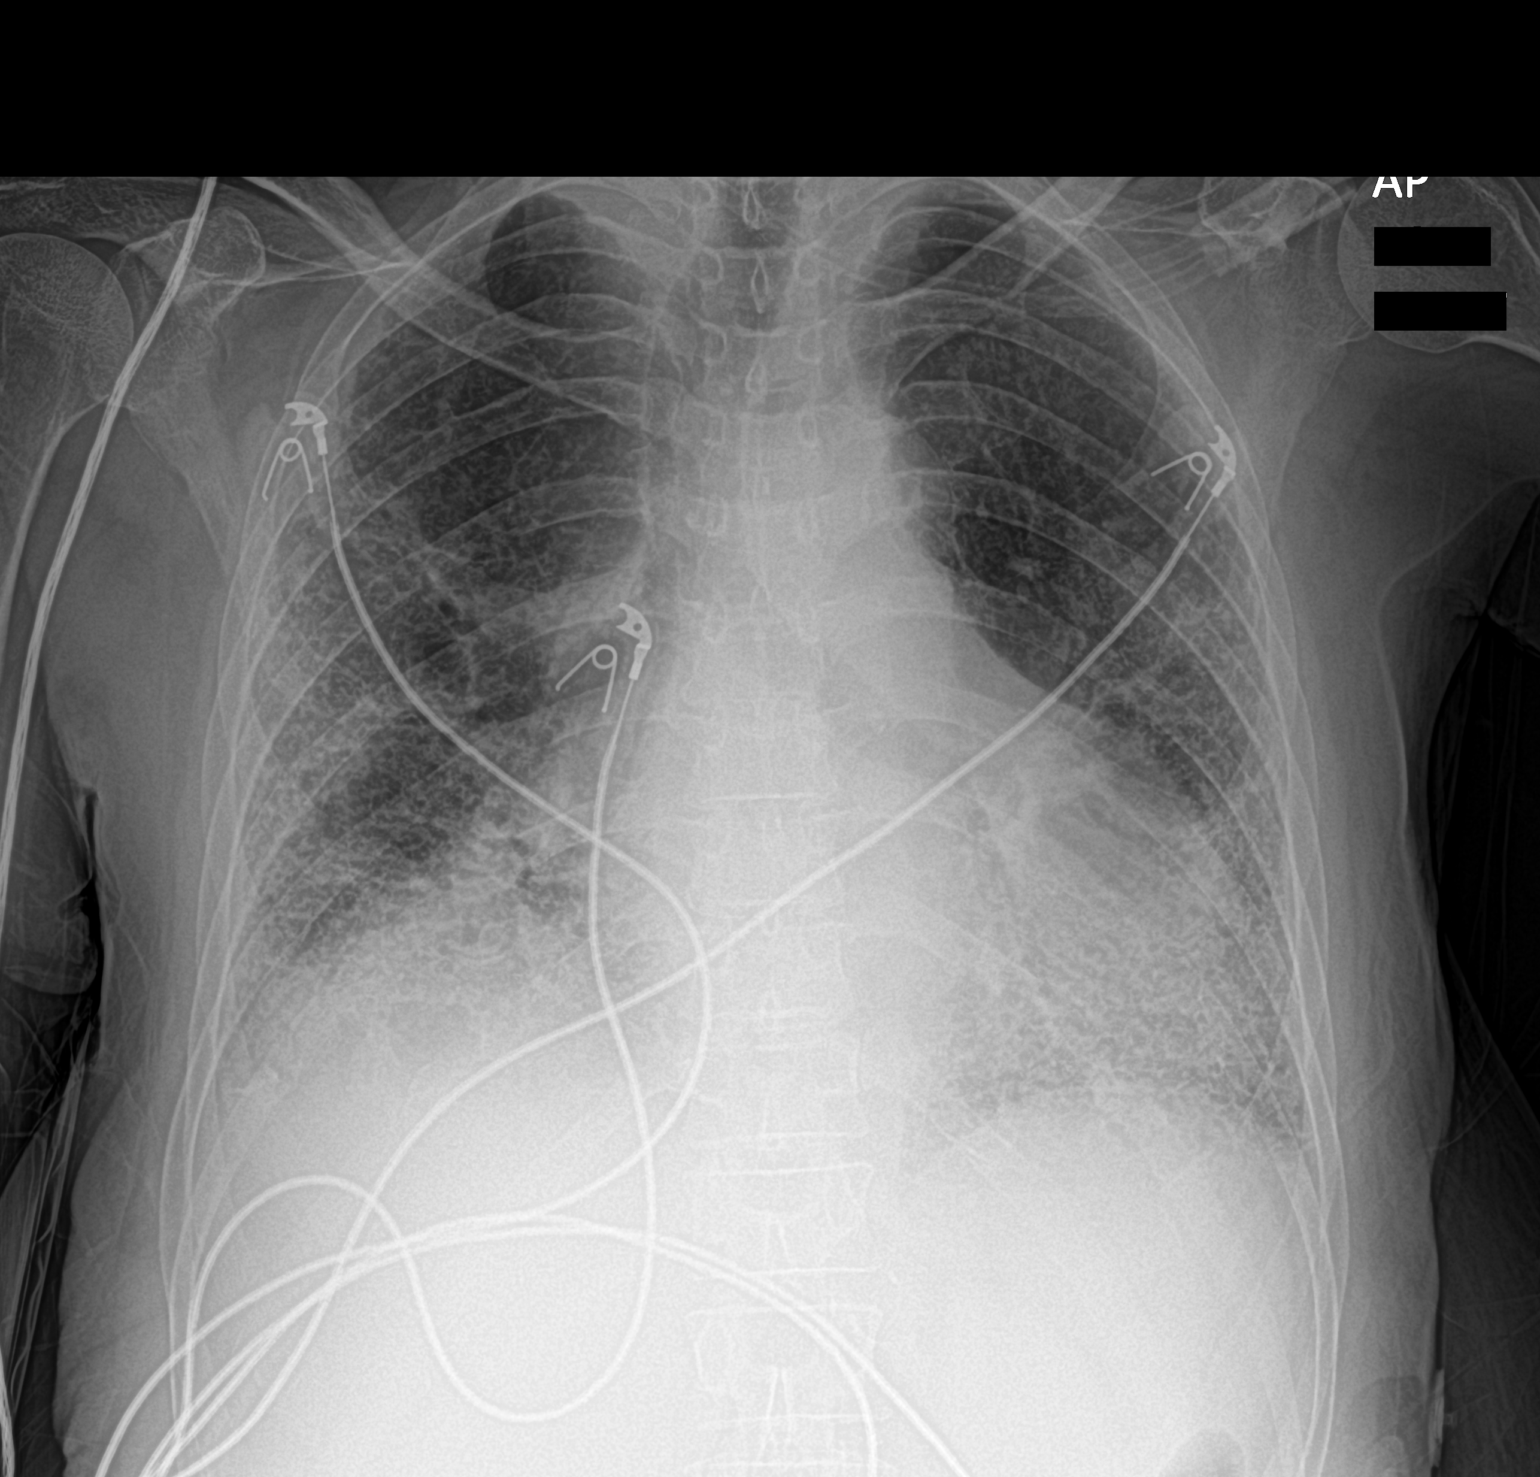

[1 of 1 positions shown; findings below may reference images not displayed]

FINDINGS: Fibrotic change is noted throughout portions of the mid and lower
lung regions bilaterally, stable. No new opacity evident. Heart is
mildly enlarged with pulmonary vascularity normal. No adenopathy. No
bone lesions.
IMPRESSION: Stable fibrosis bilaterally. No new opacity evident. Stable cardiac
prominence. No adenopathy.
